# Patient Record
Sex: Female | Born: 1960 | Race: White | Hispanic: No | Marital: Married | Smoking: Never smoker
Health system: Southern US, Community
[De-identification: ages and names within clinical notes are randomized; demographics above are authoritative.]

## PROBLEM LIST (undated history)

## (undated) DIAGNOSIS — R011 Cardiac murmur, unspecified: Secondary | ICD-10-CM

## (undated) DIAGNOSIS — K563 Gallstone ileus: Secondary | ICD-10-CM

## (undated) DIAGNOSIS — K219 Gastro-esophageal reflux disease without esophagitis: Secondary | ICD-10-CM

## (undated) DIAGNOSIS — E039 Hypothyroidism, unspecified: Secondary | ICD-10-CM

## (undated) DIAGNOSIS — K648 Other hemorrhoids: Secondary | ICD-10-CM

## (undated) DIAGNOSIS — I341 Nonrheumatic mitral (valve) prolapse: Secondary | ICD-10-CM

## (undated) DIAGNOSIS — E785 Hyperlipidemia, unspecified: Secondary | ICD-10-CM

## (undated) DIAGNOSIS — Z87442 Personal history of urinary calculi: Secondary | ICD-10-CM

## (undated) DIAGNOSIS — F329 Major depressive disorder, single episode, unspecified: Secondary | ICD-10-CM

## (undated) DIAGNOSIS — G709 Myoneural disorder, unspecified: Secondary | ICD-10-CM

## (undated) DIAGNOSIS — D126 Benign neoplasm of colon, unspecified: Secondary | ICD-10-CM

## (undated) DIAGNOSIS — E079 Disorder of thyroid, unspecified: Secondary | ICD-10-CM

## (undated) DIAGNOSIS — K5790 Diverticulosis of intestine, part unspecified, without perforation or abscess without bleeding: Secondary | ICD-10-CM

## (undated) DIAGNOSIS — B019 Varicella without complication: Secondary | ICD-10-CM

## (undated) DIAGNOSIS — R112 Nausea with vomiting, unspecified: Secondary | ICD-10-CM

## (undated) DIAGNOSIS — F32A Depression, unspecified: Secondary | ICD-10-CM

## (undated) DIAGNOSIS — D649 Anemia, unspecified: Secondary | ICD-10-CM

## (undated) DIAGNOSIS — J189 Pneumonia, unspecified organism: Secondary | ICD-10-CM

## (undated) DIAGNOSIS — J449 Chronic obstructive pulmonary disease, unspecified: Secondary | ICD-10-CM

## (undated) DIAGNOSIS — E669 Obesity, unspecified: Secondary | ICD-10-CM

## (undated) DIAGNOSIS — Z9889 Other specified postprocedural states: Secondary | ICD-10-CM

## (undated) HISTORY — PX: NASAL SINUS SURGERY: SHX719

## (undated) HISTORY — DX: Cardiac murmur, unspecified: R01.1

## (undated) HISTORY — DX: Myoneural disorder, unspecified: G70.9

## (undated) HISTORY — PX: KIDNEY STONE SURGERY: SHX686

## (undated) HISTORY — DX: Depression, unspecified: F32.A

## (undated) HISTORY — PX: LAPAROSCOPIC SMALL BOWEL RESECTION: SUR793

## (undated) HISTORY — DX: Varicella without complication: B01.9

## (undated) HISTORY — DX: Diverticulosis of intestine, part unspecified, without perforation or abscess without bleeding: K57.90

## (undated) HISTORY — PX: TONSILLECTOMY: SUR1361

## (undated) HISTORY — DX: Hyperlipidemia, unspecified: E78.5

## (undated) HISTORY — DX: Other hemorrhoids: K64.8

## (undated) HISTORY — PX: ABDOMINAL HYSTERECTOMY: SHX81

## (undated) HISTORY — DX: Obesity, unspecified: E66.9

## (undated) HISTORY — DX: Chronic obstructive pulmonary disease, unspecified: J44.9

## (undated) HISTORY — DX: Major depressive disorder, single episode, unspecified: F32.9

## (undated) HISTORY — DX: Benign neoplasm of colon, unspecified: D12.6

## (undated) HISTORY — DX: Gallstone ileus: K56.3

---

## 2011-10-20 ENCOUNTER — Other Ambulatory Visit: Payer: Self-pay | Admitting: Occupational Medicine

## 2011-10-20 ENCOUNTER — Ambulatory Visit: Payer: Self-pay

## 2011-10-20 ENCOUNTER — Other Ambulatory Visit (HOSPITAL_COMMUNITY): Payer: Self-pay | Admitting: Family Medicine

## 2011-10-20 DIAGNOSIS — M25562 Pain in left knee: Secondary | ICD-10-CM

## 2011-10-20 DIAGNOSIS — IMO0001 Reserved for inherently not codable concepts without codable children: Secondary | ICD-10-CM

## 2011-10-23 ENCOUNTER — Other Ambulatory Visit (HOSPITAL_COMMUNITY): Payer: Self-pay

## 2011-10-26 ENCOUNTER — Ambulatory Visit (HOSPITAL_COMMUNITY)
Admission: RE | Admit: 2011-10-26 | Discharge: 2011-10-26 | Disposition: A | Discharge status: Home / Self Care | Payer: PRIVATE HEALTH INSURANCE | Source: Ambulatory Visit | Attending: Family Medicine | Admitting: Family Medicine

## 2011-10-26 DIAGNOSIS — IMO0002 Reserved for concepts with insufficient information to code with codable children: Secondary | ICD-10-CM | POA: Insufficient documentation

## 2011-10-26 DIAGNOSIS — M25569 Pain in unspecified knee: Secondary | ICD-10-CM | POA: Insufficient documentation

## 2011-10-26 DIAGNOSIS — M1710 Unilateral primary osteoarthritis, unspecified knee: Secondary | ICD-10-CM | POA: Insufficient documentation

## 2011-10-26 DIAGNOSIS — X58XXXA Exposure to other specified factors, initial encounter: Secondary | ICD-10-CM | POA: Insufficient documentation

## 2011-10-26 DIAGNOSIS — IMO0001 Reserved for inherently not codable concepts without codable children: Secondary | ICD-10-CM

## 2012-01-09 ENCOUNTER — Emergency Department (INDEPENDENT_AMBULATORY_CARE_PROVIDER_SITE_OTHER): Payer: 59

## 2012-01-09 ENCOUNTER — Emergency Department (HOSPITAL_COMMUNITY)
Admission: EM | Admit: 2012-01-09 | Discharge: 2012-01-09 | Disposition: A | Discharge status: Home / Self Care | Payer: 59 | Source: Home / Self Care | Attending: Emergency Medicine | Admitting: Emergency Medicine

## 2012-01-09 ENCOUNTER — Encounter (HOSPITAL_COMMUNITY): Payer: Self-pay | Admitting: Emergency Medicine

## 2012-01-09 ENCOUNTER — Emergency Department (HOSPITAL_COMMUNITY): Payer: Self-pay

## 2012-01-09 DIAGNOSIS — M84376A Stress fracture, unspecified foot, initial encounter for fracture: Secondary | ICD-10-CM

## 2012-01-09 HISTORY — DX: Disorder of thyroid, unspecified: E07.9

## 2012-01-09 LAB — URIC ACID: Uric Acid, Serum: 3.7 mg/dL (ref 2.4–7.0)

## 2012-01-09 NOTE — ED Provider Notes (Signed)
Chief Complaint  Patient presents with  . Foot Pain    History of Present Illness:   Laurie Krueger is a 51 year old female who has had a one-week history of right foot pain and swelling, localized over the mid shaft of the fifth metatarsal. It is sensitive to touch and burns somewhat. There is no pain with ambulation. She denies any injury. Is worse if she wears tight shoes. No numbness or tingling.  Review of Systems:  Other than noted above, the patient denies any of the following symptoms: Systemic:  No fevers, chills, sweats, or aches.  No fatigue or tiredness. Musculoskeletal:  No joint pain, arthritis, bursitis, swelling, back pain, or neck pain. Neurological:  No muscular weakness, paresthesias, headache, or trouble with speech or coordination.  No dizziness.   PMFSH:  Past medical history, family history, social history, meds, and allergies were reviewed.  Physical Exam:   Vital signs:  BP 136/84  Pulse 72  Temp(Src) 97.1 F (36.2 C) (Oral)  Resp 16  SpO2 98% Gen:  Alert and oriented times 3.  In no distress. Musculoskeletal: There is swelling and slight pain to palpation over the midshaft of the fifth metatarsal of the right foot there is mild erythema, no warmth. All joints have a full range of motion. Otherwise, all joints had a full a ROM with no swelling, bruising or deformity.  No edema, pulses full. Extremities were warm and pink.  Capillary refill was brisk.  Skin:  Clear, warm and dry.  No rash. Neuro:  Alert and oriented times 3.  Muscle strength was normal.  Sensation was intact to light touch.   Radiology:  Dg Foot Complete Right  01/09/2012  *RADIOLOGY REPORT*  Clinical Data: Right foot pain and swelling for 1 week, no injury  RIGHT FOOT COMPLETE - 3+ VIEW  Comparison: None.  Findings: There is a nondisplaced transverse fracture of the mid right fifth metatarsal with adjacent soft tissue swelling.  Minimal thickening of the cortex of the adjacent 4th metatarsal also is noted  without definite fracture.  There is considerable degenerative change in the midfoot with degenerative calcaneal spurs present as well.  Joint spaces appear normal.  IMPRESSION: Nondisplaced transverse fracture of the mid right fifth metatarsal.  Original Report Authenticated By: Juline Patch, M.D.    Assessment:   Diagnoses that have been ruled out:  None  Diagnoses that are still under consideration:  None  Final diagnoses:  Stress fracture of metatarsal bone    Plan:   1.  The following meds were prescribed:   New Prescriptions   No medications on file   2.  The patient was instructed in symptomatic care, including rest and activity, elevation, application of ice and compression.  Appropriate handouts were given. 3.  The patient was told to return if becoming worse in any way, if no better in 3 or 4 days, and given some red flag symptoms that would indicate earlier return.   4.  The patient was told to follow up with her orthopedist Dr. Valentina Gu. The patient actually called the orthopedic office while she was here at the urgent care center and they told her to come right out of work, so she was put in a cast or brace and told to go right over there for further and definitive treatment. They should be able to access results the x-ray from their office.   Reuben Likes, MD 01/09/12 1001

## 2012-01-09 NOTE — ED Notes (Signed)
C/o pain, swelling and redness to rt foot for 1 week.  Denies injury. States it started with a red bump to lateral aspect of rt foot- redness, swelling noted to rt foot, feels warm to touch.

## 2012-01-09 NOTE — Discharge Instructions (Signed)
Stress Fracture       When too much stress is put on the foot, as in running and jumping sports, the center shaft of the bones of the forefoot is very susceptible to stress fractures (break in bones) because of thinness of this bone. This injury is more common if osteoporosis is present or if inadequate running shoes are used. Shoes should be used which adequately cushion the foot to absorb the shocks of the activity participated in. Stress fractures are very common in competitive female runners who develop these small cracks on the surface of the bones in their legs and feet. The women most likely to suffer these injuries are those who restrict food and those who have irregular periods.   Stress fractures usually start out as a minor discomfort in the foot or leg. The fracture often occurs near the end of a long run. Usually the pain goes away with rest. On the next day, the pain returns earlier in the run. If an athlete notices that it hurts to touch just one spot on a bone and then stops running for a week, they can return to running quickly. But usually the pain is ignored and a stress fracture develops. The athlete now has to avoid the hard pounding of running, but can ride a bike or swim for exercise until the fracture heals in 6 to 12 weeks. The most common sites for stress fractures are the bones in the front of the feet and the long bone of the lower leg, but running can cause stress fractures anywhere, even in the pelvic bones.   DIAGNOSIS   Usually the diagnosis is made by history. The bone involved progressively becomes sorer with activities. X-rays may be negative (show no break) within the first two to three weeks of the beginning of pain. A later x-ray may show signs of healing bone (callus formation). A bone scan will usually make the diagnosis earlier.   HOME CARE INSTRUCTIONS   Treatment may or may not include a cast or walking shoe. When casts are needed the use is usually for short periods of  time so as not to slow down healing with muscle wasting (atrophy).   Activities should be stopped until further advised by your caregiver.   Wear shoes with adequate shock absorbing abilities.   Alternative exercise may be undertaken while waiting for healing. These may include bicycling and swimming, or as your caregiver suggests.  If you do not have a cast or splint:   You may walk on your injured foot as tolerated or advised.   Do not put any weight on your injured foot until instructed. Slowly increase the amount of time you walk on the foot as the pain allows or as advised.   Use crutches until you can bear weight without pain. A gradual increase in weight bearing may help.   Apply ice to the injury for 15 to 20 minutes each hour while awake for the first 2 days. Put the ice in a plastic bag and place a towel between the bag of ice and your skin.   Only take over-the-counter or prescription medicines for pain, discomfort, or fever as directed by your caregiver.   If your caregiver has given you a follow-up appointment, it is very important to keep that appointment. Not keeping the appointment could result in a chronic or permanent injury, pain, and disability. If there is any problem keeping the appointment, you must call back to this facility for   assistance.  SEEK IMMEDIATE MEDICAL CARE IF:   Pain is becoming worse rather than better, or if pain is uncontrolled with medicine.   You have increased swelling or redness in the foot.  Document Released: 12/30/2002 Document Revised: 09/28/2011 Document Reviewed: 05/25/2008   [employer]® Patient Information ©2012 [employer].

## 2012-02-21 HISTORY — PX: KNEE ARTHROSCOPY: SHX127

## 2012-06-08 ENCOUNTER — Encounter: Payer: Self-pay | Admitting: *Deleted

## 2012-06-08 ENCOUNTER — Encounter: Payer: Commercial Managed Care - PPO | Attending: *Deleted | Admitting: *Deleted

## 2012-06-08 VITALS — Ht 67.5 in | Wt 263.5 lb

## 2012-06-08 DIAGNOSIS — E119 Type 2 diabetes mellitus without complications: Secondary | ICD-10-CM

## 2012-06-08 NOTE — Progress Notes (Signed)
Patient was seen on 06/08/12 for the complete series of three diabetes self-management courses at the [facility]. Current A1c = 9.4%  The following learning objectives were met by the patient during this course: Core 1:  Gain an understanding of diabetes and what causes it  Learn how diabetes is treated and the goals of treatment  Learn why, how and when to test BG  Learn how carbohydrate affects your glucose  Receive a personal food plan and learn how to count carbohydrates  Discover how physical activity enhances glucose control and overall health  Begin to gain confidence in your ability to manage diabetes  Handouts given during this class include:  Type 2 Diabetes: Basics Book  My Food Plan Book  Food and Activity Log  Core 2:  Describe causes, symptoms and treatment of hypoglycemia and hyperglycemia  Learn how to care for your glucose meter and strips  Explain how to manage diabetes during illness  List strategies to follow meal plan when dining out  Describe the effects of alcohol on glucose and how to use it safely  Describe problem solving skills for day-to-day glucose challenges  Describe ways to remain physically active  Describe the impact of regular activity on insulin resistance  Handouts given in this class:  Refrigerator magnet for Sick Day Guidelines  [facility] Oral Medication and Insulin handout  Core 3  Describe how diabetes changes over time  Understand why glucose may be out of target  Learn how diabetes changes over time  Learn about blood pressure, cholesterol, and heart health  Learn about lowering dietary fat and sodium  Understand the benefits of physical activity for heart health  Develop problem solving skills for times when glucose numbers are puzzling  Develop strategies for creating life balance  Learn how to identify if your treatment plan needs to change  Develop strategies for dealing with  stress, depression and staying motivated  Identify healthy weight-loss plans  Gain confidence that you can succeed in caring for your diabetes  Establish 2-3 goals that they will plan to diligently work on until they return                   for the free 3-month follow-up visit   The following handouts were given in class:  3 Month Follow Up Visit handout  Goal setting handout  Class evaluation form  Your patient has established the following 3 month goals for diabetes self-care:  Count carbohydrates at most meals and snacks  Increase activity by walking, swimming,a dn lifting weights  Test glucose at fasting and 2 hr after meals  Follow-Up Plan: Patient was offered a 3 month follow-up visit for diabetes self-management education.  

## 2012-06-08 NOTE — Progress Notes (Signed)
Patient was seen on 06/08/12 for the complete series of three diabetes self-management courses at the [facility]. Current A1c = 9.4%  The following learning objectives were met by the patient during this course: Core 1:  Gain an understanding of diabetes and what causes it  Learn how diabetes is treated and the goals of treatment  Learn why, how and when to test BG  Learn how carbohydrate affects your glucose  Receive a personal food plan and learn how to count carbohydrates  Discover how physical activity enhances glucose control and overall health  Begin to gain confidence in your ability to manage diabetes  Handouts given during this class include:  Type 2 Diabetes: Basics Book  My Food Plan Book  Food and Activity Log  Core 2:  Describe causes, symptoms and treatment of hypoglycemia and hyperglycemia  Learn how to care for your glucose meter and strips  Explain how to manage diabetes during illness  List strategies to follow meal plan when dining out  Describe the effects of alcohol on glucose and how to use it safely  Describe problem solving skills for day-to-day glucose challenges  Describe ways to remain physically active  Describe the impact of regular activity on insulin resistance  Handouts given in this class:  Refrigerator magnet for Sick Day Guidelines  [facility] Oral Medication and Insulin handout  Core 3  Describe how diabetes changes over time  Understand why glucose may be out of target  Learn how diabetes changes over time  Learn about blood pressure, cholesterol, and heart health  Learn about lowering dietary fat and sodium  Understand the benefits of physical activity for heart health  Develop problem solving skills for times when glucose numbers are puzzling  Develop strategies for creating life balance  Learn how to identify if your treatment plan needs to change  Develop strategies for dealing with  stress, depression and staying motivated  Identify healthy weight-loss plans  Gain confidence that you can succeed in caring for your diabetes  Establish 2-3 goals that they will plan to diligently work on until they return                   for the free 48-month follow-up visit   The following handouts were given in class:  3 Month Follow Up Visit handout  Goal setting handout  Class evaluation form  Your patient has established the following 3 month goals for diabetes self-care:  Count carbohydrates at most meals and snacks  Increase activity by walking, swimming,a dn lifting weights  Test glucose at fasting and 2 hr after meals  Follow-Up Plan: Patient was offered a 3 month follow-up visit for diabetes self-management education.

## 2012-06-08 NOTE — Patient Instructions (Addendum)
Goals:  Follow Diabetes Meal Plan as instructed  Eat 3 meals and 2 snacks, every 3-5 hrs  Limit carbohydrate intake to 30-45 grams carbohydrate/meal  Limit carbohydrate intake to 0-15 grams carbohydrate/snack  Add lean protein foods to meals/snacks  Monitor glucose levels as instructed by your doctor  Aim for 15-30 mins of physical activity daily as tolerated  Bring food record and glucose log to your next nutrition visit 

## 2012-08-09 ENCOUNTER — Ambulatory Visit (INDEPENDENT_AMBULATORY_CARE_PROVIDER_SITE_OTHER): Payer: Commercial Managed Care - PPO | Admitting: General Surgery

## 2012-08-09 ENCOUNTER — Encounter (INDEPENDENT_AMBULATORY_CARE_PROVIDER_SITE_OTHER): Payer: Self-pay | Admitting: General Surgery

## 2012-08-09 VITALS — BP 138/82 | HR 77 | Temp 97.9°F | Resp 18 | Ht 67.0 in | Wt 266.6 lb

## 2012-08-09 DIAGNOSIS — E785 Hyperlipidemia, unspecified: Secondary | ICD-10-CM

## 2012-08-09 DIAGNOSIS — Z6841 Body Mass Index (BMI) 40.0 and over, adult: Secondary | ICD-10-CM

## 2012-08-09 DIAGNOSIS — E039 Hypothyroidism, unspecified: Secondary | ICD-10-CM

## 2012-08-09 DIAGNOSIS — E119 Type 2 diabetes mellitus without complications: Secondary | ICD-10-CM

## 2012-08-09 NOTE — Progress Notes (Signed)
Patient ID: Laurie Krueger, female   DOB: November 01, 1960, 51 y.o.   MRN: [id number]  Chief Complaint  Patient presents with  . New Evaluation    Bariatric    HPI Laurie Krueger is a 51 y.o. female.  this patient presents for her initial weight loss surgery consultation. 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 She has a BMI of 41 with comorbidities of diabetes last 3 years, depression, and hyperlipidemia. She says that her diabetes has been poorly controlled with oral medications and her most recent hemoglobin A1c was 9.3. She says that she is active and does play tennis and swims about 2-3 times per week. She is interested in durable weight loss solution since she has gained weight since the birth of her twins about 18 years ago. She denies any heartburn or reflux.HPI  Past Medical History  Diagnosis Date  . Diabetes mellitus   . Thyroid disease   . Hyperlipidemia   . Obesity     Past Surgical History  Procedure Date  . Abdominal hysterectomy   . Tonsillectomy   . Kidney stone surgery     Family History  Problem Relation Age of Onset  . Cancer Other   . Ulcerative colitis Other     Social History History  Substance Use Topics  . Smoking status: Never Smoker   . Smokeless tobacco: Not on file  . Alcohol Use: No    Allergies  Allergen Reactions  . Codeine     Current Outpatient Prescriptions  Medication Sig Dispense Refill  . citalopram (CELEXA) 20 MG tablet Take 20 mg by mouth daily.      . Exenatide (BYDUREON [place]) Inject into the skin once a week.      . fish oil-omega-3 fatty acids 1000 MG capsule Take 2 g by mouth daily.      Marland Kitchen levothyroxine (SYNTHROID) 150 MCG tablet Take 150 mcg by mouth daily.      . rosuvastatin (CRESTOR) 10 MG tablet Take 10 mg by mouth daily.      . Saxagliptin-Metformin (KOMBIGLYZE XR) 02-999 MG TB24 Take by mouth.        Review of Systems Review of Systems All other  review of systems negative or noncontributory except as stated in the HPI  Blood pressure 138/82, pulse 77, temperature 97.9 F (36.6 C), temperature source Oral, resp. rate 18, height 5\' 7"  (1.702 m), weight 266 lb 9.6 oz (120.929 kg).  Physical Exam Physical Exam Physical Exam  Nursing note and vitals reviewed. Constitutional: She is oriented to person, place, and time. She appears well-developed and well-nourished. No distress.  HENT:  Head: Normocephalic and atraumatic.  Mouth/Throat: No oropharyngeal exudate.  Eyes: Conjunctivae and EOM are normal. Pupils are equal, round, and reactive to light. Right eye exhibits no discharge. Left eye exhibits no discharge. No scleral icterus.  Neck: Normal range of motion. Neck supple. No tracheal deviation present.  Cardiovascular: Normal rate, regular rhythm, normal heart sounds and intact distal pulses.   Pulmonary/Chest: Effort normal and breath sounds normal. No stridor. No respiratory distress. She has no wheezes.  Abdominal: Soft. Bowel sounds are normal. She exhibits no distension and no mass. There is no tenderness. There is no rebound and no guarding.  Musculoskeletal: Normal range of motion. She exhibits no edema and no tenderness.  Neurological: She is alert and oriented to person, place, and time.  Skin: Skin is warm and dry.  No rash noted. She is not diaphoretic. No erythema. No pallor.  Psychiatric: She has a normal mood and affect. Her behavior is normal. Judgment and thought content normal.    Data Reviewed   Assessment    Morbid obesity with a BMI of 41 and comorbidities of diabetes, hyperlipidemia, and depression With a long discussion about 40 minutes regarding the options for weight loss including nonsurgical and surgical methods. We discussed the options for lap band, sleeve gastrectomy, and gastric bypass including the pros and cons of each. She is most interested in the gastric bypass or the sleeve gastrectomy and has not  made up her mind at this point. I recommended that she go home and think about the options and continued to do some research and discuss this with her family and her physicians and we will go ahead and start her on the workup for weight loss surgery. She will go ahead and calls back after she decides on the procedure.  The risks of infection, bleeding, pain, scarring, weight regain, too little or too much weight loss, vitamin deficiencies and need for lifelong vitamin supplementation, hair loss, need for protein supplementation, leaks, stricture, reflux, food intolerance, need for reoperation and conversion to roux Y gastric bypass, need for open surgery, injury to spleen or surrounding structures, DVT's, PE, and death were discussed with the patient's and she is leaning towards a sleeve gastrectomy.    Plan    We will proceed with bariatric workup including laboratory studies, upper GI, psychology and nutrition evaluations. I would recommend that she visited with her primary physician to improve her diabetes control prior to weight loss surgery       Nelma Phagan DAVID 08/09/2012, 11:38 AM

## 2012-08-13 ENCOUNTER — Ambulatory Visit (HOSPITAL_COMMUNITY)
Admission: RE | Admit: 2012-08-13 | Discharge: 2012-08-13 | Disposition: A | Discharge status: Home / Self Care | Payer: 59 | Source: Ambulatory Visit | Attending: General Surgery | Admitting: General Surgery

## 2012-08-13 ENCOUNTER — Other Ambulatory Visit (HOSPITAL_COMMUNITY): Payer: Self-pay | Admitting: *Deleted

## 2012-08-13 DIAGNOSIS — Z1231 Encounter for screening mammogram for malignant neoplasm of breast: Secondary | ICD-10-CM

## 2012-08-13 DIAGNOSIS — E039 Hypothyroidism, unspecified: Secondary | ICD-10-CM

## 2012-08-13 DIAGNOSIS — Z6841 Body Mass Index (BMI) 40.0 and over, adult: Secondary | ICD-10-CM

## 2012-08-13 DIAGNOSIS — E785 Hyperlipidemia, unspecified: Secondary | ICD-10-CM

## 2012-08-13 DIAGNOSIS — Z0000 Encounter for general adult medical examination without abnormal findings: Secondary | ICD-10-CM

## 2012-08-13 DIAGNOSIS — E119 Type 2 diabetes mellitus without complications: Secondary | ICD-10-CM

## 2012-08-16 ENCOUNTER — Ambulatory Visit (HOSPITAL_COMMUNITY)
Admission: RE | Admit: 2012-08-16 | Discharge: 2012-08-16 | Disposition: A | Discharge status: Home / Self Care | Payer: 59 | Source: Ambulatory Visit | Attending: *Deleted | Admitting: *Deleted

## 2012-08-16 DIAGNOSIS — Z1231 Encounter for screening mammogram for malignant neoplasm of breast: Secondary | ICD-10-CM | POA: Insufficient documentation

## 2012-08-20 ENCOUNTER — Encounter: Payer: 59 | Attending: *Deleted | Admitting: *Deleted

## 2012-08-20 ENCOUNTER — Encounter: Payer: Self-pay | Admitting: *Deleted

## 2012-08-20 DIAGNOSIS — Z01818 Encounter for other preprocedural examination: Secondary | ICD-10-CM | POA: Insufficient documentation

## 2012-08-20 DIAGNOSIS — Z713 Dietary counseling and surveillance: Secondary | ICD-10-CM | POA: Insufficient documentation

## 2012-08-20 LAB — CBC WITH DIFFERENTIAL/PLATELET
Basophils Relative: 1 % (ref 0–1)
Eosinophils Absolute: 0.2 10*3/uL (ref 0.0–0.7)
Eosinophils Relative: 5 % (ref 0–5)
MCH: 29.1 pg (ref 26.0–34.0)
MCHC: 33.5 g/dL (ref 30.0–36.0)
MCV: 86.9 fL (ref 78.0–100.0)
Neutrophils Relative %: 53 % (ref 43–77)
Platelets: 239 10*3/uL (ref 150–400)

## 2012-08-20 LAB — LIPID PANEL
Cholesterol: 146 mg/dL (ref 0–200)
Total CHOL/HDL Ratio: 2.4 Ratio

## 2012-08-20 LAB — COMPREHENSIVE METABOLIC PANEL
Alkaline Phosphatase: 70 U/L (ref 39–117)
Creat: 0.69 mg/dL (ref 0.50–1.10)
Glucose, Bld: 386 mg/dL — ABNORMAL HIGH (ref 70–99)
Sodium: 137 mEq/L (ref 135–145)
Total Bilirubin: 0.7 mg/dL (ref 0.3–1.2)
Total Protein: 6.5 g/dL (ref 6.0–8.3)

## 2012-08-20 LAB — IRON AND TIBC
%SAT: 24 % (ref 20–55)
Iron: 82 ug/dL (ref 42–145)
TIBC: 348 ug/dL (ref 250–470)
UIBC: 266 ug/dL (ref 125–400)

## 2012-08-20 LAB — TSH: TSH: 1.545 u[IU]/mL (ref 0.350–4.500)

## 2012-08-20 LAB — HEMOGLOBIN A1C: Mean Plasma Glucose: 223 mg/dL — ABNORMAL HIGH (ref <117)

## 2012-08-20 LAB — T4: T4, Total: 13.9 ug/dL — ABNORMAL HIGH (ref 5.0–12.5)

## 2012-08-20 NOTE — Progress Notes (Signed)
  Pre-Op Assessment Visit:  Pre-Operative Gastric Sleeve Surgery  Medical Nutrition Therapy:  Appt start time: 0900   End time:  1000.  Patient was seen on 08/20/2012 for Pre-Operative Gastric Sleeve Nutrition Assessment. Assessment and letter of approval faxed to [facility] Bariatric Surgery Program coordinator on 08/20/2012.  Approval letter sent to [facility] center and will be available in the chart under the media tab.  TANITA  BODY COMP RESULTS  08/20/12   %Fat 51.9%   Fat Mass (lbs) 139.0   Fat Free Mass (lbs) 129.0   Total Body Water (lbs) 94.5   Handouts given during visit include:  Pre-Op Goals   Bariatric Surgery Protein Shakes handout  Patient to call for Pre-Op and Post-Op Nutrition Education at the [facility] when surgery is scheduled.

## 2012-08-20 NOTE — Patient Instructions (Addendum)
   Follow Pre-Op Nutrition Goals to prepare for Bariatric Surgery.   Call the [facility] at [phone] once you have been given your surgery date to enrolled in the Pre-Op Nutrition Class. You will need to attend this nutrition class 3-4 weeks prior to your surgery.  

## 2012-08-21 LAB — H. PYLORI ANTIBODY, IGG: H Pylori IgG: 0.61 {ISR}

## 2012-08-29 ENCOUNTER — Ambulatory Visit: Payer: 59 | Admitting: *Deleted

## 2012-09-09 ENCOUNTER — Ambulatory Visit: Payer: 59 | Admitting: *Deleted

## 2012-09-18 ENCOUNTER — Other Ambulatory Visit (INDEPENDENT_AMBULATORY_CARE_PROVIDER_SITE_OTHER): Payer: Self-pay | Admitting: General Surgery

## 2012-09-18 DIAGNOSIS — E669 Obesity, unspecified: Secondary | ICD-10-CM

## 2012-09-25 ENCOUNTER — Encounter (INDEPENDENT_AMBULATORY_CARE_PROVIDER_SITE_OTHER): Payer: Self-pay | Admitting: General Surgery

## 2012-09-25 ENCOUNTER — Encounter: Payer: Self-pay | Admitting: *Deleted

## 2012-09-25 ENCOUNTER — Ambulatory Visit (INDEPENDENT_AMBULATORY_CARE_PROVIDER_SITE_OTHER): Payer: Commercial Managed Care - PPO | Admitting: General Surgery

## 2012-09-25 DIAGNOSIS — Z6841 Body Mass Index (BMI) 40.0 and over, adult: Secondary | ICD-10-CM

## 2012-09-25 NOTE — Progress Notes (Signed)
Patient ID: Laurie Krueger, female   DOB: 03/28/1961, 51 y.o.   MRN: [id number]  Chief Complaint  Patient presents with  . Bariatric Pre-op    sleeve    HPI Laurie Krueger is a 51 y.o. female.  This patient presents for her preoperative evaluation for vertical sleeve gastrectomy for obesity. She has a BMI of 41 with comorbidities of poorly controlled diabetes and hyperlipidemia. She struggled to wait for a long time and has failed medical weight loss attempts. She has had little success managing her diabetes and is in need of weight loss solution. She is scheduled for surgery in 2 weeks HPI  Past Medical History  Diagnosis Date  . Diabetes mellitus   . Thyroid disease   . Hyperlipidemia   . Obesity     Past Surgical History  Procedure Date  . Abdominal hysterectomy   . Tonsillectomy   . Kidney stone surgery     Family History  Problem Relation Age of Onset  . Cancer Other   . Ulcerative colitis Other     Social History History  Substance Use Topics  . Smoking status: Never Smoker   . Smokeless tobacco: Not on file  . Alcohol Use: No    Allergies  Allergen Reactions  . Codeine     Current Outpatient Prescriptions  Medication Sig Dispense Refill  . citalopram (CELEXA) 20 MG tablet Take 20 mg by mouth daily.      . Exenatide (BYDUREON [place]) Inject into the skin once a week.      . fish oil-omega-3 fatty acids 1000 MG capsule Take 2 g by mouth daily.      . levothyroxine (SYNTHROID) 150 MCG tablet Take 150 mcg by mouth daily.      . rosuvastatin (CRESTOR) 10 MG tablet Take 10 mg by mouth daily.      . Saxagliptin-Metformin (KOMBIGLYZE XR) 02-999 MG TB24 Take by mouth.        Review of Systems Review of Systems All other review of systems negative or noncontributory except as stated in the HPI  Blood pressure 114/68, pulse 85, temperature 97 F (36.1 C), temperature source Temporal, resp. rate 16, height 5' 7" (1.702 m), weight 261 lb (118.389 kg), SpO2  98.00%.  Physical Exam Physical Exam Physical Exam  Nursing note and vitals reviewed. Constitutional: She is oriented to person, place, and time. She appears well-developed and well-nourished. No distress.  HENT:  Head: Normocephalic and atraumatic.  Mouth/Throat: No oropharyngeal exudate.  Eyes: Conjunctivae and EOM are normal. Pupils are equal, round, and reactive to light. Right eye exhibits no discharge. Left eye exhibits no discharge. No scleral icterus.  Neck: Normal range of motion. Neck supple. No tracheal deviation present.  Cardiovascular: Normal rate, regular rhythm, normal heart sounds and intact distal pulses.   Pulmonary/Chest: Effort normal and breath sounds normal. No stridor. No respiratory distress. She has no wheezes.  Abdominal: Soft. Bowel sounds are normal. She exhibits no distension and no mass. There is no tenderness. There is no rebound and no guarding.  Musculoskeletal: Normal range of motion. She exhibits no edema and no tenderness.  Neurological: She is alert and oriented to person, place, and time.  Skin: Skin is warm and dry. No rash noted. She is not diaphoretic. No erythema. No pallor.  Psychiatric: She has a normal mood and affect. Her behavior is normal. Judgment and thought content normal.    Data Reviewed   Assessment    Morbid obesity with a   BMI of 41 and comorbidities of diabetes mellitus and hyperlipidemia We again discussed the options for surgery and she remains interested in a vertical sleeve gastrectomy. We discussed the perioperative course and postoperative expectations. He discussed the pros and cons and the risks and benefits of the procedure.The risks of infection, bleeding, pain, scarring, weight regain, too little or too much weight loss, vitamin deficiencies and need for lifelong vitamin supplementation, hair loss, need for protein supplementation, leaks, stricture, reflux, food intolerance, need for reoperation and conversion to roux Y  gastric bypass, need for open surgery, injury to spleen or surrounding structures, DVT's, PE, and death again discussed with the patient and the patient expressed understanding and desires to proceed with laparoscopic vertical sleeve gastrectomy, possible open, intraoperative endoscopy.     Plan    We will plan for a vertical sleeve gastrectomy as already scheduled       Daphney Hopke DAVID 09/25/2012, 10:31 AM    

## 2012-09-27 ENCOUNTER — Encounter (INDEPENDENT_AMBULATORY_CARE_PROVIDER_SITE_OTHER): Payer: Self-pay

## 2012-09-27 ENCOUNTER — Telehealth (INDEPENDENT_AMBULATORY_CARE_PROVIDER_SITE_OTHER): Payer: Self-pay

## 2012-09-27 NOTE — Telephone Encounter (Signed)
Return to work note completed and faxed at patients request to [employer] (Supervisor) [phone]

## 2012-09-30 ENCOUNTER — Encounter: Payer: Self-pay | Admitting: *Deleted

## 2012-09-30 NOTE — Progress Notes (Unsigned)
Pt stopped by the [facility] office to pick up the following samples:   1.  UNJURY Protein Powder:  (1 ea/ total 4 pkts)  Lot # [id number]; Exp:  Lot # [id number]; Exp:  Lot # [id number]; Exp: 10/14 Lot # [id number]; Exp: 02/15  2. OPURITY Multivitamin (for RYGB/Sleeve): 1 ea Lot # [id number]; Exp: 06/14  3. Celebrate Vitamins: (1 ea of the following)     MVI-Complete - Lot # [id number]; Exp: 06/15     MVI - Lot # [id number]; Exp: 07/14     Sublingual B12 - Lot # [id number]; Exp: 05/15          Calcium citrate - Lot # [id number]; Exp: 03/15     Iron + C 30 mg - Lot # [id number]; Exp: 09/15  4. Premier Protein Shake: 2 bottles     Lot # [id number]; Exp: 08/10/13

## 2012-10-01 ENCOUNTER — Encounter (HOSPITAL_COMMUNITY): Payer: Self-pay | Admitting: Pharmacy Technician

## 2012-10-03 ENCOUNTER — Encounter (HOSPITAL_COMMUNITY): Payer: Self-pay

## 2012-10-03 ENCOUNTER — Encounter (HOSPITAL_COMMUNITY)
Admission: RE | Admit: 2012-10-03 | Discharge: 2012-10-03 | Disposition: A | Discharge status: Home / Self Care | Payer: 59 | Source: Ambulatory Visit | Attending: General Surgery | Admitting: General Surgery

## 2012-10-03 VITALS — BP 133/81 | HR 74 | Temp 97.7°F | Resp 18 | Ht 67.0 in | Wt 261.0 lb

## 2012-10-03 DIAGNOSIS — E669 Obesity, unspecified: Secondary | ICD-10-CM

## 2012-10-03 HISTORY — DX: Personal history of urinary calculi: Z87.442

## 2012-10-03 HISTORY — DX: Nonrheumatic mitral (valve) prolapse: I34.1

## 2012-10-03 HISTORY — DX: Hypothyroidism, unspecified: E03.9

## 2012-10-03 LAB — CBC WITH DIFFERENTIAL/PLATELET
Basophils Absolute: 0 10*3/uL (ref 0.0–0.1)
Basophils Relative: 1 % (ref 0–1)
Eosinophils Absolute: 0.2 10*3/uL (ref 0.0–0.7)
Hemoglobin: 12.4 g/dL (ref 12.0–15.0)
MCH: 29.5 pg (ref 26.0–34.0)
MCHC: 34.5 g/dL (ref 30.0–36.0)
Monocytes Relative: 6 % (ref 3–12)
Neutro Abs: 2.6 10*3/uL (ref 1.7–7.7)
Neutrophils Relative %: 51 % (ref 43–77)
Platelets: 213 10*3/uL (ref 150–400)
RDW: 12.4 % (ref 11.5–15.5)

## 2012-10-03 LAB — SURGICAL PCR SCREEN
MRSA, PCR: NEGATIVE
Staphylococcus aureus: NEGATIVE

## 2012-10-03 LAB — COMPREHENSIVE METABOLIC PANEL
AST: 19 U/L (ref 0–37)
Albumin: 3.5 g/dL (ref 3.5–5.2)
Alkaline Phosphatase: 56 U/L (ref 39–117)
BUN: 12 mg/dL (ref 6–23)
Chloride: 102 mEq/L (ref 96–112)
Potassium: 3.9 mEq/L (ref 3.5–5.1)
Sodium: 136 mEq/L (ref 135–145)
Total Bilirubin: 0.5 mg/dL (ref 0.3–1.2)
Total Protein: 6.4 g/dL (ref 6.0–8.3)

## 2012-10-03 NOTE — Patient Instructions (Addendum)
20 Laurie Krueger  10/03/2012   Your procedure is scheduled on:  12-17 -2013  Report to [facility] at     0530   AM.  Call this number if you have problems the morning of surgery: [phone]  Or Presurgical Testing [phone](Gio Janoski)   Remember: Follow any bowel prep instructions per MD office.   Do not eat food:After Midnight.  Take these medicines the morning of surgery with A SIP OF WATER: Citalopram. Levothyroxine. Crestor. No Diabetic meds AM of.   Do not wear jewelry, make-up or nail polish.  Do not wear lotions, powders, or perfumes. You may wear deodorant.  Do not shave 48 hours prior to surgery.(face and neck okay, no shaving of legs)  Do not bring valuables to the hospital.  Contacts, dentures or bridgework may not be worn into surgery.  Leave suitcase in the car. After surgery it may be brought to your room.  For patients admitted to the hospital, checkout time is 11:00 AM the day of discharge.   Patients discharged the day of surgery will not be allowed to drive home. Must have responsible person with you x 24 hours once discharged.  Name and phone number of your driver: [name], spouse [phone] cell  Special Instructions: CHG Shower Use Special Wash: see special instructions.(avoid face and genitals)   Please read over the following fact sheets that you were given: MRSA Information,  Incentive Spirometry Instruction.    Failure to follow these instructions may result in Cancellation of your surgery.   Patient signature_______________________________________________________

## 2012-10-04 ENCOUNTER — Ambulatory Visit (INDEPENDENT_AMBULATORY_CARE_PROVIDER_SITE_OTHER): Payer: 59 | Admitting: General Surgery

## 2012-10-07 MED ORDER — SODIUM CHLORIDE 0.9 % IV SOLN
1.0000 g | INTRAVENOUS | Status: AC
  Administered 2012-10-08: 1 g via INTRAVENOUS
  Filled 2012-10-07: qty 1

## 2012-10-08 ENCOUNTER — Encounter (HOSPITAL_COMMUNITY): Payer: Self-pay | Admitting: *Deleted

## 2012-10-08 ENCOUNTER — Ambulatory Visit (HOSPITAL_COMMUNITY): Payer: 59 | Admitting: Anesthesiology

## 2012-10-08 ENCOUNTER — Encounter (HOSPITAL_COMMUNITY): Payer: Self-pay | Admitting: Anesthesiology

## 2012-10-08 ENCOUNTER — Encounter (HOSPITAL_COMMUNITY)
Admission: RE | Disposition: A | Discharge status: Home / Self Care | Payer: Self-pay | Source: Ambulatory Visit | Attending: General Surgery

## 2012-10-08 ENCOUNTER — Inpatient Hospital Stay (HOSPITAL_COMMUNITY)
Admission: RE | Admit: 2012-10-08 | Discharge: 2012-10-11 | DRG: 621 | Disposition: A | Discharge status: Home / Self Care | Payer: 59 | Source: Ambulatory Visit | Attending: General Surgery | Admitting: General Surgery

## 2012-10-08 DIAGNOSIS — E119 Type 2 diabetes mellitus without complications: Secondary | ICD-10-CM

## 2012-10-08 DIAGNOSIS — E785 Hyperlipidemia, unspecified: Secondary | ICD-10-CM | POA: Diagnosis present

## 2012-10-08 DIAGNOSIS — R110 Nausea: Secondary | ICD-10-CM | POA: Diagnosis not present

## 2012-10-08 DIAGNOSIS — Z885 Allergy status to narcotic agent status: Secondary | ICD-10-CM

## 2012-10-08 DIAGNOSIS — E079 Disorder of thyroid, unspecified: Secondary | ICD-10-CM | POA: Diagnosis present

## 2012-10-08 DIAGNOSIS — E669 Obesity, unspecified: Secondary | ICD-10-CM

## 2012-10-08 DIAGNOSIS — Z6841 Body Mass Index (BMI) 40.0 and over, adult: Secondary | ICD-10-CM

## 2012-10-08 DIAGNOSIS — R1310 Dysphagia, unspecified: Secondary | ICD-10-CM | POA: Diagnosis not present

## 2012-10-08 HISTORY — PX: LAPAROSCOPIC GASTRIC SLEEVE RESECTION: SHX5895

## 2012-10-08 LAB — GLUCOSE, CAPILLARY
Glucose-Capillary: 222 mg/dL — ABNORMAL HIGH (ref 70–99)
Glucose-Capillary: 200 mg/dL — ABNORMAL HIGH (ref 70–99)
Glucose-Capillary: 199 mg/dL — ABNORMAL HIGH (ref 70–99)

## 2012-10-08 SURGERY — GASTRECTOMY, SLEEVE, LAPAROSCOPIC
Anesthesia: General | Site: Abdomen | Wound class: Clean

## 2012-10-08 MED ORDER — ONDANSETRON HCL 4 MG/2ML IJ SOLN
4.0000 mg | INTRAMUSCULAR | Status: DC | PRN
  Administered 2012-10-08 – 2012-10-10 (×5): 4 mg via INTRAVENOUS
  Filled 2012-10-08 (×5): qty 2

## 2012-10-08 MED ORDER — FENTANYL CITRATE 0.05 MG/ML IJ SOLN
INTRAMUSCULAR | Status: AC
  Filled 2012-10-08: qty 2

## 2012-10-08 MED ORDER — LIDOCAINE-EPINEPHRINE 1 %-1:100000 IJ SOLN
INTRAMUSCULAR | Status: AC
  Filled 2012-10-08: qty 1

## 2012-10-08 MED ORDER — MEPERIDINE HCL 50 MG/ML IJ SOLN: 6.2500 mg | INTRAMUSCULAR | Status: DC | PRN

## 2012-10-08 MED ORDER — LIDOCAINE-EPINEPHRINE 1 %-1:100000 IJ SOLN
INTRAMUSCULAR | Status: DC | PRN
  Administered 2012-10-08: 22 mL

## 2012-10-08 MED ORDER — UNJURY CHICKEN SOUP POWDER: 2.0000 [oz_av] | Freq: Four times a day (QID) | ORAL | Status: DC

## 2012-10-08 MED ORDER — PROMETHAZINE HCL 25 MG/ML IJ SOLN
INTRAMUSCULAR | Status: AC
  Filled 2012-10-08: qty 1

## 2012-10-08 MED ORDER — INSULIN ASPART 100 UNIT/ML ~~LOC~~ SOLN
3.0000 [IU] | Freq: Once | SUBCUTANEOUS | Status: AC
  Administered 2012-10-08: 3 [IU] via SUBCUTANEOUS

## 2012-10-08 MED ORDER — MIDAZOLAM HCL 5 MG/5ML IJ SOLN
INTRAMUSCULAR | Status: DC | PRN
  Administered 2012-10-08: 2 mg via INTRAVENOUS

## 2012-10-08 MED ORDER — INSULIN ASPART 100 UNIT/ML ~~LOC~~ SOLN
0.0000 [IU] | SUBCUTANEOUS | Status: DC
  Administered 2012-10-08: 5 [IU] via SUBCUTANEOUS
  Administered 2012-10-08 (×2): 3 [IU] via SUBCUTANEOUS
  Administered 2012-10-09 – 2012-10-10 (×8): 2 [IU] via SUBCUTANEOUS
  Administered 2012-10-11: 3 [IU] via SUBCUTANEOUS

## 2012-10-08 MED ORDER — ONDANSETRON HCL 4 MG/2ML IJ SOLN
INTRAMUSCULAR | Status: DC | PRN
  Administered 2012-10-08: 4 mg via INTRAVENOUS

## 2012-10-08 MED ORDER — FENTANYL CITRATE 0.05 MG/ML IJ SOLN
INTRAMUSCULAR | Status: DC | PRN
  Administered 2012-10-08: 100 ug via INTRAVENOUS
  Administered 2012-10-08 (×2): 50 ug via INTRAVENOUS

## 2012-10-08 MED ORDER — BUPIVACAINE-EPINEPHRINE PF 0.25-1:200000 % IJ SOLN
INTRAMUSCULAR | Status: AC
  Filled 2012-10-08: qty 30

## 2012-10-08 MED ORDER — LACTATED RINGERS IV SOLN
INTRAVENOUS | Status: DC | PRN
  Administered 2012-10-08 (×3): via INTRAVENOUS

## 2012-10-08 MED ORDER — HYDROMORPHONE HCL PF 1 MG/ML IJ SOLN
0.2500 mg | INTRAMUSCULAR | Status: DC | PRN
  Administered 2012-10-08 (×2): 0.5 mg via INTRAVENOUS
  Administered 2012-10-08: 0.25 mg via INTRAVENOUS
  Administered 2012-10-08: 0.5 mg via INTRAVENOUS

## 2012-10-08 MED ORDER — CISATRACURIUM BESYLATE (PF) 10 MG/5ML IV SOLN
INTRAVENOUS | Status: DC | PRN
  Administered 2012-10-08: 8 mg via INTRAVENOUS
  Administered 2012-10-08: 4 mg via INTRAVENOUS

## 2012-10-08 MED ORDER — TISSEEL VH 10 ML EX KIT
PACK | CUTANEOUS | Status: DC | PRN
  Administered 2012-10-08: 10 mL

## 2012-10-08 MED ORDER — PROMETHAZINE HCL 25 MG/ML IJ SOLN
6.2500 mg | INTRAMUSCULAR | Status: DC | PRN
Start: 2012-10-08 — End: 2012-10-08

## 2012-10-08 MED ORDER — SCOPOLAMINE 1 MG/3DAYS TD PT72
MEDICATED_PATCH | TRANSDERMAL | Status: DC | PRN
  Administered 2012-10-08: 1 via TRANSDERMAL

## 2012-10-08 MED ORDER — LACTATED RINGERS IV SOLN: INTRAVENOUS | Status: DC

## 2012-10-08 MED ORDER — DEXAMETHASONE SODIUM PHOSPHATE 10 MG/ML IJ SOLN
INTRAMUSCULAR | Status: DC | PRN
  Administered 2012-10-08: 10 mg via INTRAVENOUS

## 2012-10-08 MED ORDER — ACETAMINOPHEN 160 MG/5ML PO SOLN
650.0000 mg | ORAL | Status: DC | PRN
  Filled 2012-10-08: qty 20.3

## 2012-10-08 MED ORDER — POTASSIUM CHLORIDE IN NACL 20-0.9 MEQ/L-% IV SOLN
INTRAVENOUS | Status: DC
  Administered 2012-10-08: 19:00:00 via INTRAVENOUS
  Administered 2012-10-09: 125 mL via INTRAVENOUS
  Administered 2012-10-09 – 2012-10-10 (×2): via INTRAVENOUS
  Administered 2012-10-10: 125 mL via INTRAVENOUS
  Filled 2012-10-08 (×8): qty 1000

## 2012-10-08 MED ORDER — LACTATED RINGERS IR SOLN
Status: DC | PRN
  Administered 2012-10-08: 1000 mL

## 2012-10-08 MED ORDER — OXYCODONE-ACETAMINOPHEN 5-325 MG/5ML PO SOLN: 5.0000 mL | ORAL | Status: DC | PRN

## 2012-10-08 MED ORDER — OXYCODONE HCL 5 MG PO TABS: 5.0000 mg | ORAL_TABLET | Freq: Once | ORAL | Status: DC | PRN

## 2012-10-08 MED ORDER — UNJURY CHOCOLATE CLASSIC POWDER
2.0000 [oz_av] | Freq: Four times a day (QID) | ORAL | Status: DC
  Administered 2012-10-11: 2 [oz_av] via ORAL

## 2012-10-08 MED ORDER — ACETAMINOPHEN 10 MG/ML IV SOLN: 1000.0000 mg | Freq: Once | INTRAVENOUS | Status: DC | PRN

## 2012-10-08 MED ORDER — POTASSIUM CHLORIDE IN NACL 20-0.9 MEQ/L-% IV SOLN
INTRAVENOUS | Status: AC
  Filled 2012-10-08: qty 1000

## 2012-10-08 MED ORDER — HYDROMORPHONE HCL PF 1 MG/ML IJ SOLN
INTRAMUSCULAR | Status: AC
  Filled 2012-10-08: qty 1

## 2012-10-08 MED ORDER — ACETAMINOPHEN 10 MG/ML IV SOLN
INTRAVENOUS | Status: DC | PRN
  Administered 2012-10-08: 1000 mg via INTRAVENOUS

## 2012-10-08 MED ORDER — ACETAMINOPHEN 10 MG/ML IV SOLN
INTRAVENOUS | Status: AC
  Filled 2012-10-08: qty 100

## 2012-10-08 MED ORDER — INSULIN ASPART 100 UNIT/ML ~~LOC~~ SOLN
SUBCUTANEOUS | Status: AC
  Filled 2012-10-08: qty 3

## 2012-10-08 MED ORDER — LEVOTHYROXINE SODIUM 150 MCG PO TABS
150.0000 ug | ORAL_TABLET | Freq: Every day | ORAL | Status: DC
  Administered 2012-10-09 – 2012-10-11 (×3): 150 ug via ORAL
  Filled 2012-10-08 (×5): qty 1

## 2012-10-08 MED ORDER — UNJURY VANILLA POWDER: 2.0000 [oz_av] | Freq: Four times a day (QID) | ORAL | Status: DC

## 2012-10-08 MED ORDER — BUPIVACAINE HCL 0.25 % IJ SOLN
INTRAMUSCULAR | Status: DC | PRN
  Administered 2012-10-08: 22 mL

## 2012-10-08 MED ORDER — BUPIVACAINE HCL (PF) 0.25 % IJ SOLN
INTRAMUSCULAR | Status: AC
  Filled 2012-10-08: qty 30

## 2012-10-08 MED ORDER — LIDOCAINE HCL 1 % IJ SOLN
INTRAMUSCULAR | Status: AC
  Filled 2012-10-08: qty 20

## 2012-10-08 MED ORDER — FENTANYL CITRATE 0.05 MG/ML IJ SOLN
25.0000 ug | INTRAMUSCULAR | Status: DC | PRN
  Administered 2012-10-08 (×2): 50 ug via INTRAVENOUS

## 2012-10-08 MED ORDER — EPHEDRINE SULFATE 50 MG/ML IJ SOLN
INTRAMUSCULAR | Status: DC | PRN
  Administered 2012-10-08: 10 mg via INTRAVENOUS

## 2012-10-08 MED ORDER — TISSEEL VH 10 ML EX KIT
PACK | CUTANEOUS | Status: AC
  Filled 2012-10-08: qty 1

## 2012-10-08 MED ORDER — GLYCOPYRROLATE 0.2 MG/ML IJ SOLN
INTRAMUSCULAR | Status: DC | PRN
  Administered 2012-10-08: 0.4 mg via INTRAVENOUS

## 2012-10-08 MED ORDER — HEPARIN SODIUM (PORCINE) 5000 UNIT/ML IJ SOLN
5000.0000 [IU] | Freq: Once | INTRAMUSCULAR | Status: AC
  Administered 2012-10-08: 5000 [IU] via SUBCUTANEOUS
  Filled 2012-10-08: qty 1

## 2012-10-08 MED ORDER — PROPOFOL 10 MG/ML IV BOLUS
INTRAVENOUS | Status: DC | PRN
  Administered 2012-10-08: 170 mg via INTRAVENOUS

## 2012-10-08 MED ORDER — ENOXAPARIN SODIUM 40 MG/0.4ML ~~LOC~~ SOLN
40.0000 mg | SUBCUTANEOUS | Status: DC
  Administered 2012-10-09 – 2012-10-11 (×3): 40 mg via SUBCUTANEOUS
  Filled 2012-10-08 (×5): qty 0.4

## 2012-10-08 MED ORDER — OXYCODONE HCL 5 MG/5ML PO SOLN: 5.0000 mg | Freq: Once | ORAL | Status: DC | PRN

## 2012-10-08 MED ORDER — MORPHINE SULFATE 10 MG/ML IJ SOLN
2.0000 mg | INTRAMUSCULAR | Status: DC | PRN
  Administered 2012-10-08 – 2012-10-10 (×12): 4 mg via INTRAVENOUS
  Filled 2012-10-08 (×13): qty 1

## 2012-10-08 SURGICAL SUPPLY — 53 items
APPLICATOR COTTON TIP 6IN STRL (MISCELLANEOUS) IMPLANT
APPLIER CLIP ROT 10 11.4 M/L (STAPLE) ×2
CABLE HIGH FREQUENCY MONO STRZ (ELECTRODE) IMPLANT
CANISTER SUCTION 2500CC (MISCELLANEOUS) ×2 IMPLANT
CHLORAPREP W/TINT 26ML (MISCELLANEOUS) ×4 IMPLANT
CLIP APPLIE ROT 10 11.4 M/L (STAPLE) ×1 IMPLANT
CLOTH BEACON ORANGE TIMEOUT ST (SAFETY) ×2 IMPLANT
COVER SURGICAL LIGHT HANDLE (MISCELLANEOUS) ×2 IMPLANT
DERMABOND ADVANCED (GAUZE/BANDAGES/DRESSINGS)
DERMABOND ADVANCED .7 DNX12 (GAUZE/BANDAGES/DRESSINGS) IMPLANT
DEVICE SUTURE ENDOST 10MM (ENDOMECHANICALS) IMPLANT
DRAIN CHANNEL 19F RND (DRAIN) ×2 IMPLANT
DRAPE LAPAROSCOPIC ABDOMINAL (DRAPES) ×2 IMPLANT
ELECT REM PT RETURN 9FT ADLT (ELECTROSURGICAL) ×2
ELECTRODE REM PT RTRN 9FT ADLT (ELECTROSURGICAL) ×1 IMPLANT
EVACUATOR DRAINAGE 10X20 100CC (DRAIN) ×1 IMPLANT
EVACUATOR SILICONE 100CC (DRAIN) ×1
GLOVE SURG SS PI 7.5 STRL IVOR (GLOVE) ×4 IMPLANT
GOWN STRL NON-REIN LRG LVL3 (GOWN DISPOSABLE) ×4 IMPLANT
GOWN STRL REIN XL XLG (GOWN DISPOSABLE) ×4 IMPLANT
HANDLE STAPLE EGIA 4 XL (STAPLE) ×2 IMPLANT
HOVERMATT SINGLE USE (MISCELLANEOUS) ×2 IMPLANT
KIT BASIN OR (CUSTOM PROCEDURE TRAY) ×2 IMPLANT
MARKER SKIN DUAL TIP RULER LAB (MISCELLANEOUS) ×2 IMPLANT
NEEDLE SPNL 22GX3.5 QUINCKE BK (NEEDLE) ×2 IMPLANT
NS IRRIG 1000ML POUR BTL (IV SOLUTION) ×2 IMPLANT
PENCIL BUTTON HOLSTER BLD 10FT (ELECTRODE) ×2 IMPLANT
POUCH SPECIMEN RETRIEVAL 10MM (ENDOMECHANICALS) IMPLANT
RELOAD BLACK 60MM ECHELON (STAPLE) IMPLANT
RELOAD EGIA 60 MED/THCK PURPLE (STAPLE) ×6 IMPLANT
RELOAD GREEN (STAPLE) IMPLANT
RELOAD TRI 2.0 60 XTHK VAS SUL (STAPLE) ×4 IMPLANT
SCISSORS LAP 5X35 DISP (ENDOMECHANICALS) ×2 IMPLANT
SEALANT SURGICAL APPL DUAL CAN (MISCELLANEOUS) ×2 IMPLANT
SET IRRIG TUBING LAPAROSCOPIC (IRRIGATION / IRRIGATOR) ×2 IMPLANT
SHEARS CURVED HARMONIC AC 45CM (MISCELLANEOUS) ×2 IMPLANT
SLEEVE XCEL OPT CAN 5 100 (ENDOMECHANICALS) ×4 IMPLANT
SOLUTION ANTI FOG 6CC (MISCELLANEOUS) ×2 IMPLANT
SPONGE GAUZE 4X4 12PLY (GAUZE/BANDAGES/DRESSINGS) IMPLANT
SPONGE LAP 18X18 X RAY DECT (DISPOSABLE) ×2 IMPLANT
STAPLE ECHEON FLEX 60 POW ENDO (STAPLE) IMPLANT
STRIP PERI DRY VERITAS 60 (STAPLE) IMPLANT
SUT ETHILON 2 0 PS N (SUTURE) ×2 IMPLANT
SUT MNCRL AB 4-0 PS2 18 (SUTURE) ×2 IMPLANT
SUT VICRYL 0 UR6 27IN ABS (SUTURE) ×2 IMPLANT
SYR 50ML LL SCALE MARK (SYRINGE) ×2 IMPLANT
TRAY FOLEY CATH 14FRSI W/METER (CATHETERS) ×2 IMPLANT
TRAY LAP CHOLE (CUSTOM PROCEDURE TRAY) ×2 IMPLANT
TROCAR BLADELESS 15MM (ENDOMECHANICALS) ×2 IMPLANT
TROCAR BLADELESS OPT 5 100 (ENDOMECHANICALS) ×2 IMPLANT
TUBING CONNECTING 10 (TUBING) ×2 IMPLANT
TUBING ENDO SMARTCAP (MISCELLANEOUS) ×2 IMPLANT
TUBING FILTER THERMOFLATOR (ELECTROSURGICAL) ×2 IMPLANT

## 2012-10-08 NOTE — Brief Op Note (Signed)
10/08/2012  9:42 AM  PATIENT:  Laurie Krueger  51 y.o. female  PRE-OPERATIVE DIAGNOSIS:  morbid obesity   POST-OPERATIVE DIAGNOSIS:  MORBID OBESITY   PROCEDURE:  Procedure(s) (LRB) with comments: LAPAROSCOPIC GASTRIC SLEEVE RESECTION (N/A)  SURGEON:  Surgeon(s) and Role:    * Lodema Pilot, DO - Primary  PHYSICIAN ASSISTANT:   ASSISTANTS: Newman   ANESTHESIA:   general  EBL:  Total I/O In: 2100 [I.V.:2100] Out: 175 [Urine:125; Blood:50]  BLOOD ADMINISTERED:none  DRAINS: (7F) Jackson-Pratt drain(s) with closed bulb suction in the sleeve staple line   LOCAL MEDICATIONS USED:  MARCAINE    and LIDOCAINE   SPECIMEN:  Source of Specimen:  sleeve gastrectomy  DISPOSITION OF SPECIMEN:  PATHOLOGY  COUNTS:  YES  TOURNIQUET:  * No tourniquets in log *  DICTATION: .Other Dictation: Dictation Number [id number]  PLAN OF CARE: Admit to inpatient   PATIENT DISPOSITION:  PACU - hemodynamically stable.   Delay start of Pharmacological VTE agent (>24hrs) due to surgical blood loss or risk of bleeding: no

## 2012-10-08 NOTE — Anesthesia Postprocedure Evaluation (Signed)
Anesthesia Post Note  Patient: Laurie Krueger  Procedure(s) Performed: Procedure(s) (LRB): LAPAROSCOPIC GASTRIC SLEEVE RESECTION (N/A)  Anesthesia type: General  Patient location: PACU  Post pain: Pain level controlled  Post assessment: Post-op Vital signs reviewed  Last Vitals: BP 125/77  Pulse 99  Temp 36.6 C (Oral)  Resp 16  Ht 5\' 7"  (1.702 m)  Wt 255 lb (115.667 kg)  BMI 39.94 kg/m2  SpO2 94%  Post vital signs: Reviewed  Level of consciousness: sedated  Complications: No apparent anesthesia complications

## 2012-10-08 NOTE — Anesthesia Preprocedure Evaluation (Addendum)
Anesthesia Evaluation  Patient identified by MRN, date of birth, ID band Patient awake    Reviewed: Allergy & Precautions, H&P , NPO status , Patient's Chart, lab work & pertinent test results  Airway Mallampati: I TM Distance: >3 FB Neck ROM: Full    Dental  (+) Dental Advisory Given, Teeth Intact and Chipped,    Pulmonary neg pulmonary ROS,  breath sounds clear to auscultation  Pulmonary exam normal       Cardiovascular - Past MI and - CHF + Valvular Problems/Murmurs MVP Rhythm:Regular Rate:Normal     Neuro/Psych negative neurological ROS  negative psych ROS   GI/Hepatic negative GI ROS, Neg liver ROS,   Endo/Other  diabetes, Type 2, Oral Hypoglycemic AgentsHypothyroidism Morbid obesity  Renal/GU negative Renal ROS     Musculoskeletal negative musculoskeletal ROS (+)   Abdominal (+) + obese,   Peds  Hematology negative hematology ROS (+)   Anesthesia Other Findings   Reproductive/Obstetrics                         Anesthesia Physical Anesthesia Plan  ASA: III  Anesthesia Plan: General   Post-op Pain Management:    Induction: Intravenous  Airway Management Planned: Oral ETT  Additional Equipment:   Intra-op Plan:   Post-operative Plan: Extubation in OR  Informed Consent: I have reviewed the patients History and Physical, chart, labs and discussed the procedure including the risks, benefits and alternatives for the proposed anesthesia with the patient or authorized representative who has indicated his/her understanding and acceptance.   Dental advisory given  Plan Discussed with: CRNA  Anesthesia Plan Comments:         Anesthesia Quick Evaluation

## 2012-10-08 NOTE — Op Note (Signed)
NAME:  Laurie Krueger, Laurie Krueger NO.:  [id number]  MEDICAL RECORD NO.:  [id number]  LOCATION:  1539                         FACILITY:  [facility]  PHYSICIAN:  Laurie Pilot, MD       DATE OF BIRTH:  [birth year]  DATE OF PROCEDURE:  10/08/2012 DATE OF DISCHARGE:                              OPERATIVE REPORT   PROCEDURE:  Laparoscopic vertical sleeve gastrectomy with intraoperative endoscopy.  PREOPERATIVE DIAGNOSIS:  Morbid obesity.  POSTOPERATIVE DIAGNOSIS:  Morbid obesity.  SURGEON:  Laurie Pilot, MD  ASSISTANT:  Laurie Krueger. Laurie Krueger, M.D.  ANESTHESIA:  General endotracheal tube anesthesia with 45 mL of 1% lidocaine with epinephrine and 0.25% Marcaine in a 50:50 mixture.  FLUIDS:  2 L of crystalloid.  ESTIMATED BLOOD LOSS:  50 mL.  DRAINS:  A 19-French Blake drain placed along the gastric sleeve staple line.  SPECIMEN:  Sleeve gastrectomy sent to Pathology for permanent section.  COMPLICATIONS:  None apparent.  FINDINGS:  Vertical sleeve gastrectomy with 36-French bougie, 19-French Blake drain placement on the sleeve staple line.  Tisseel fibrin glue placement on staple line as well.  She had some pelvic adhesions, but otherwise normal-appearing anatomy.  Intraoperative endoscopy showed patent sleeve without any evidence of air leak or stenosis.  INDICATION FOR PROCEDURE:  Laurie Krueger is a 51 year old female with a BMI of 41 and comorbidities of poorly controlled diabetes and hyperlipidemia.  She has struggled with her weight for many years, especially with her poorly controlled diabetes.  She desires long-term weight loss solution.  OPERATIVE DETAILS:  Laurie Krueger was seen and evaluated in the preoperative area and risks and benefits of procedure were again discussed in lay terms.  Informed consent was obtained.  She was given subcu heparin and prophylactic antibiotics and taken to the operating room, placed on the table in a supine position.  General  endotracheal tube anesthesia was obtained and Foley catheter was placed.  Her abdomen was prepped and draped in standard surgical fashion.  The procedure time- out was performed with all operative team members confirm proper patient, procedure.  A 5-mm Optiview trocar was used to access her abdomen.  In the first attempt, pneumoperitoneum was obtained and laparoscope was introduced.  There was no evidence of bleeding or bowel injury upon entry.  A 5-mm left rectus port was placed.  A 15-mm right rectus port and 5-mm right upper quadrant port was placed under direct visualization.  Laurie Krueger liver retractor was passed through a separate stab incision in the epigastrium.  Left lobe of the liver was retracted. All the tubes were removed from the stomach and measured out 5 cm from the pylorus and began to divide the short gastric vessels using the Harmonic scalpel.  Dissection was carried up around the greater curve of the stomach and around the spleen.  The stomach was closely adherent to the spleen in this area but separated nicely with Harmonic scalpel.  I mobilized the stomach off the diaphragm and the left crus was identified.  All the short gastric vessels were divided.  There was no obvious hiatal hernia.  The posterior gastric attachments were divided with sharp dissection to the lesser curve vessels and  stomach was fully mobilized.  Again of note, her tubes were verified to be out of the bounds and started dividing the stomach, starting 5 cm from the pylorus. The first firing was taken angled up towards the angle of His with a 60 mm black Tri-Staple load.  Care was taken not to narrow the stomach and angularis incisura.  A second black Tri-Staple load was placed in the anticipated angle of firings and a 36-French bougie was passed along the lesser curve and down to the pylorus and without repositioning the stapler, the second staple firing was taken.  I then transitioned to 60- mm  purple Tri-Staple load and carried dissection along the bougie towards the angle of His, taking care to avoid Christmas tree formation of the staple line and spiraling of the staple.  Prior to taking the final firing, I mobilized the gastric fat pad and divided the stomach completely at the angle of His taking care to avoid any esophageal injury.  The stomach was completely transected and the staple line was inspected.  There was one spot on the staple line in which I placed some clips for hemostasis.  There was a small hematoma in the midportion of the sleeve, but this was stable and not expanding.  The remainder of the staple line appeared hemostatic.  The spleen appeared hemostatic, and Dr. Ezzard Krueger then performed an upper endoscopy and well lubricated fiberoptic endoscope was passed without difficulty into the esophagus and into the sleeve.  The sleeve was very smooth without any evidence of stricture or internal bleeding.  The scope passed easily into the entrance and to the pylorus.  Then the air was suctioned and the scope was removed.  There was no evidence of air leakage during the endoscopy that I had to submerge into water.  I suctioned the fluid from the abdomen and the hematoma was stable and was not expanding.  Tisseel fibrin glue was placed along with the staple lines and a 19-French Blake drain was passed into the abdomen and placed just posterior to the sleeve.  This was exited through the left upper quadrant trocar site and sutured in place with a 2-0 nylon drain stitches.  The omentum was replaced back over the sleeve and the liver retractor was removed.  The stomach was extracted through an enlarged 15-mm port site and the fascia was approximated in open fashion using 3-0 Vicryl sutures.  The sutures were secured and the abdomen was re-insufflated and the abdominal wall closure was noted to be adequate.  The abdominal wall was noted be hemostatic.  The sleeve appeared  hemostatic.  The spleen appeared hemostatic.  The final trocar was removed under direct visualization and the extraction site was irrigated with sterile saline solution.  The wounds were injected with total of 45 mL of 1% lidocaine with epinephrine.  Skin edges were approximated with 4-0 Monocryl subcuticular suture.  Skin was washed and dried.  Dermabond was applied. All sponge, needle, and instrument counts were correct at end of the case.  The patient tolerated the procedure well without apparent complication.          ______________________________ Laurie Pilot, MD     BL/MEDQ  D:  10/08/2012  T:  10/08/2012  Job:  [id number]

## 2012-10-08 NOTE — Transfer of Care (Signed)
Immediate Anesthesia Transfer of Care Note  Patient: Laurie Krueger  Procedure(s) Performed: Procedure(s) (LRB) with comments: LAPAROSCOPIC GASTRIC SLEEVE RESECTION (N/A)  Patient Location: PACU  Anesthesia Type:General  Level of Consciousness: awake, sedated and patient cooperative  Airway & Oxygen Therapy: Patient Spontanous Breathing and Patient connected to face mask oxygen  Post-op Assessment: Report given to PACU RN and Post -op Vital signs reviewed and stable  Post vital signs: Reviewed and stable  Complications: No apparent anesthesia complications

## 2012-10-08 NOTE — Interval H&P Note (Signed)
History and Physical Interval Note:  10/08/2012 7:16 AM  Laurie Krueger  has presented today for surgery, with the diagnosis of morbid obesity   The various methods of treatment have been discussed with the patient and family. After consideration of risks, benefits and other options for treatment, the patient has consented to  Procedure(s) (LRB) with comments: LAPAROSCOPIC GASTRIC SLEEVE RESECTION (N/A) as a surgical intervention .  The patient's history has been reviewed, patient examined, no change in status, stable for surgery.  I have reviewed the patient's chart and labs.  Questions were answered to the patient's satisfaction.  Pt seen in preop area. Risks of procedure discussed in lay terms.  The risks of infection, bleeding, pain, scarring, weight regain, too little or too much weight loss, vitamin deficiencies and need for lifelong vitamin supplementation, hair loss, need for protein supplementation, leaks, stricture, reflux, food intolerance, need for reoperation and conversion to roux Y gastric bypass, need for open surgery, injury to spleen or surrounding structures, DVT's, PE, and death again discussed with the patient and the patient expressed understanding and desires to proceed with laparoscopic vertical sleeve gastrectomy, possible open, intraoperative endoscopy. She has been on preop diet and all questions answered.  Will plan for sleeve gastrectomy.    Lodema Pilot DAVID

## 2012-10-08 NOTE — Progress Notes (Signed)
Patient ID: Laurie Krueger, female   DOB: 09/11/1961, 51 y.o.   MRN: [id number] Doing fine.  No apparent issues.

## 2012-10-08 NOTE — H&P (View-Only) (Signed)
Patient ID: Laurie Krueger, female   DOB: 1960-10-28, 51 y.o.   MRN: [id number]  Chief Complaint  Patient presents with  . Bariatric Pre-op    sleeve    HPI Laurie Krueger is a 51 y.o. female.  This patient presents for her preoperative evaluation for vertical sleeve gastrectomy for obesity. She has a BMI of 41 with comorbidities of poorly controlled diabetes and hyperlipidemia. She struggled to wait for a long time and has failed medical weight loss attempts. She has had little success managing her diabetes and is in need of weight loss solution. She is scheduled for surgery in 2 weeks HPI  Past Medical History  Diagnosis Date  . Diabetes mellitus   . Thyroid disease   . Hyperlipidemia   . Obesity     Past Surgical History  Procedure Date  . Abdominal hysterectomy   . Tonsillectomy   . Kidney stone surgery     Family History  Problem Relation Age of Onset  . Cancer Other   . Ulcerative colitis Other     Social History History  Substance Use Topics  . Smoking status: Never Smoker   . Smokeless tobacco: Not on file  . Alcohol Use: No    Allergies  Allergen Reactions  . Codeine     Current Outpatient Prescriptions  Medication Sig Dispense Refill  . citalopram (CELEXA) 20 MG tablet Take 20 mg by mouth daily.      . Exenatide (BYDUREON [place]) Inject into the skin once a week.      . fish oil-omega-3 fatty acids 1000 MG capsule Take 2 g by mouth daily.      Marland Kitchen levothyroxine (SYNTHROID) 150 MCG tablet Take 150 mcg by mouth daily.      . rosuvastatin (CRESTOR) 10 MG tablet Take 10 mg by mouth daily.      . Saxagliptin-Metformin (KOMBIGLYZE XR) 02-999 MG TB24 Take by mouth.        Review of Systems Review of Systems All other review of systems negative or noncontributory except as stated in the HPI  Blood pressure 114/68, pulse 85, temperature 97 F (36.1 C), temperature source Temporal, resp. rate 16, height 5\' 7"  (1.702 m), weight 261 lb (118.389 kg), SpO2  98.00%.  Physical Exam Physical Exam Physical Exam  Nursing note and vitals reviewed. Constitutional: She is oriented to person, place, and time. She appears well-developed and well-nourished. No distress.  HENT:  Head: Normocephalic and atraumatic.  Mouth/Throat: No oropharyngeal exudate.  Eyes: Conjunctivae and EOM are normal. Pupils are equal, round, and reactive to light. Right eye exhibits no discharge. Left eye exhibits no discharge. No scleral icterus.  Neck: Normal range of motion. Neck supple. No tracheal deviation present.  Cardiovascular: Normal rate, regular rhythm, normal heart sounds and intact distal pulses.   Pulmonary/Chest: Effort normal and breath sounds normal. No stridor. No respiratory distress. She has no wheezes.  Abdominal: Soft. Bowel sounds are normal. She exhibits no distension and no mass. There is no tenderness. There is no rebound and no guarding.  Musculoskeletal: Normal range of motion. She exhibits no edema and no tenderness.  Neurological: She is alert and oriented to person, place, and time.  Skin: Skin is warm and dry. No rash noted. She is not diaphoretic. No erythema. No pallor.  Psychiatric: She has a normal mood and affect. Her behavior is normal. Judgment and thought content normal.    Data Reviewed   Assessment    Morbid obesity with a  BMI of 41 and comorbidities of diabetes mellitus and hyperlipidemia We again discussed the options for surgery and she remains interested in a vertical sleeve gastrectomy. We discussed the perioperative course and postoperative expectations. He discussed the pros and cons and the risks and benefits of the procedure.The risks of infection, bleeding, pain, scarring, weight regain, too little or too much weight loss, vitamin deficiencies and need for lifelong vitamin supplementation, hair loss, need for protein supplementation, leaks, stricture, reflux, food intolerance, need for reoperation and conversion to roux Y  gastric bypass, need for open surgery, injury to spleen or surrounding structures, DVT's, PE, and death again discussed with the patient and the patient expressed understanding and desires to proceed with laparoscopic vertical sleeve gastrectomy, possible open, intraoperative endoscopy.     Plan    We will plan for a vertical sleeve gastrectomy as already scheduled       Daunte Oestreich DAVID 09/25/2012, 10:31 AM

## 2012-10-08 NOTE — Op Note (Signed)
NAMEMarland Kitchen  KAMEKO, HUKILL NO.:  [id number]  MEDICAL RECORD NO.:  [id number]  LOCATION:  1539                         FACILITY:  [facility]  PHYSICIAN:  Sandria Bales. Ezzard Standing, M.D.  DATE OF BIRTH:  1960-12-11  DATE OF PROCEDURE:  10/08/2012                              OPERATIVE REPORT  PREOPERATIVE DIAGNOSIS:  Morbid obesity with BMI of 41, status post laparoscopic sleeve gastrectomy.  POSTOPERATIVE DIAGNOSIS:  Morbid obesity with BMI of 41, status post laparoscopic sleeve gastrectomy.  PROCEDURE:  Esophagogastroscopy.  SURGEON:  Sandria Bales. Ezzard Standing, MD  No first assistant.  ANESTHESIA:  General endotracheal.  INDICATION FOR PROCEDURE:  Ms. Stitzer is a 51 year old female, who has undergone a laparoscopic sleeve gastrectomy by Dr. Lodema Pilot for morbid obesity.  I am doing upper endoscopy to document the sleeve pouch for both mucosal viability and bleeding.  OPERATIVE NOTE:  The patient in a supine position.  Dr. Biagio Quint is manning the laparoscope during my endoscopy.  I passed the Olympus endoscope down the back of her throat without difficulty.  I visualized the esophagogastric junction at 40 cm.  Her staple line which started immediately beyond the esophagogastric junction was intact.  There was no bleeding.  The mucosa looked viable.  There was no narrowing of the sleeve gastrectomy.  I advanced the scope down to the pylorus and visualized the antrum.  I insufflated air into the pouch.  Dr. Biagio Quint flooded the upper abdomen with saline. There was no evidence of any bubbling noted.  I then withdrew the scope and the esophagus was unremarkable.  Dr. Biagio Quint will dictate the sleeve gastrectomy portion of the operation.   Sandria Bales. Ezzard Standing, M.D., FACS   DHN/MEDQ  D:  10/08/2012  T:  10/08/2012  Job:  [id number]

## 2012-10-09 ENCOUNTER — Encounter (HOSPITAL_COMMUNITY): Payer: Self-pay | Admitting: General Surgery

## 2012-10-09 LAB — CBC WITH DIFFERENTIAL/PLATELET
Basophils Absolute: 0 10*3/uL (ref 0.0–0.1)
Basophils Relative: 0 % (ref 0–1)
Eosinophils Absolute: 0 10*3/uL (ref 0.0–0.7)
Lymphs Abs: 1.5 10*3/uL (ref 0.7–4.0)
MCH: 29.4 pg (ref 26.0–34.0)
MCHC: 33.3 g/dL (ref 30.0–36.0)
Neutrophils Relative %: 72 % (ref 43–77)
Platelets: 191 10*3/uL (ref 150–400)
RBC: 3.61 MIL/uL — ABNORMAL LOW (ref 3.87–5.11)
RDW: 12.8 % (ref 11.5–15.5)

## 2012-10-09 LAB — COMPREHENSIVE METABOLIC PANEL
ALT: 30 U/L (ref 0–35)
CO2: 27 mEq/L (ref 19–32)
Calcium: 9 mg/dL (ref 8.4–10.5)
GFR calc Af Amer: >90 mL/min (ref >90)
GFR calc non Af Amer: >90 mL/min (ref >90)
Glucose, Bld: 145 mg/dL — ABNORMAL HIGH (ref 70–99)
Sodium: 136 mEq/L (ref 135–145)

## 2012-10-09 LAB — GLUCOSE, CAPILLARY
Glucose-Capillary: 141 mg/dL — ABNORMAL HIGH (ref 70–99)
Glucose-Capillary: 135 mg/dL — ABNORMAL HIGH (ref 70–99)
Glucose-Capillary: 133 mg/dL — ABNORMAL HIGH (ref 70–99)
Glucose-Capillary: 105 mg/dL — ABNORMAL HIGH (ref 70–99)

## 2012-10-09 MED ORDER — BIOTENE DRY MOUTH MT LIQD: 15.0000 mL | Freq: Two times a day (BID) | OROMUCOSAL | Status: DC

## 2012-10-09 MED ORDER — CHLORHEXIDINE GLUCONATE 0.12 % MT SOLN
15.0000 mL | Freq: Two times a day (BID) | OROMUCOSAL | Status: DC
  Filled 2012-10-09 (×3): qty 15

## 2012-10-09 NOTE — Progress Notes (Signed)
Pt alert and oriented; VSS; CBG's 135-200, will continue to monitor; denies any nausea or vomiting; burping; +flatus; denies BM at this time; voiding without difficulty; ambulating in hallways without difficulty; c/o some abdominal soreness at JP drain site with relief from prn meds; using incentive as directed; plan of care discussed and pt verbalized understanding of; pt already has follow up appts with [facility] and [facility]; aware of support group and BELT program; discharge instructions given for pt to review; questions answered.  GASTRIC BYPASS/SLEEVE DISCHARGE INSTRUCTIONS  Drs. Fredrik Rigger, Hoxworth, Wilson, and [place] Call if you have any problems.   Call [phone] and ask for the surgeon on call.    If you need immediate assistance come to the ER at [facility]. Tell the ER personnel that you are a new post-op gastric bypass patient. Signs and symptoms to report:   Severe vomiting or nausea. If you cannot tolerate clear liquids for longer than 1 day, you need to call your surgeon.    Abdominal pain which does not get better after taking your pain medication   Fever greater than 101 F degree   Difficulty breathing   Chest pain    Redness, swelling, drainage, or foul odor at incision sites    If your incisions open or pull apart   Swelling or pain in calf (lower leg)   Diarrhea, frequent watery, uncontrolled bowel movements.   Constipation, (no bowel movements for 3 days) if this occurs, Take Milk of Magnesia, 2 tablespoons by mouth, 3 times a day for 2 days if needed.  Call your doctor if constipation continues. Stop taking Milk of Magnesia once you have had a bowel movement. You may also use Miralax according to the label instructions.   Anything you consider "abnormal for you".   Normal side effects after Surgery:   Unable to sleep at night or concentrate   Irritability   Being tearful (crying) or depressed   These are common complaints, possibly related to your anesthesia, stress of  surgery and change in lifestyle, that usually go away a few weeks after surgery.  If these feelings continue, call your medical doctor.  Wound Care You may have surgical glue, steri-strips, or staples over your incisions after surgery.  Surgical glue:  Looks like a clear film over your incisions and will wear off gradually. Steri-strips: Strips of tape over your incisions. You may notice a yellowish color on the skin underneath the steri-strips. This is a substance used to make the steri-strips stick better. Do not pull the steri-strips off - let them fall off.  Staples: Cherlynn Polo may be removed before you leave the hospital. If you go home with staples, call [facility] [phone]) for an appointment with your surgeon's nurse to have staples removed in 7 - 10 days. Showering: You may shower two days after your surgery unless otherwise instructed by your surgeon. Wash gently around wounds with warm soapy water, rinse well, and gently pat dry.  If you have a drain, you may need someone to hold this while you shower. Avoid tub baths until staples are removed and incisions are healed.    Medications   Medications should be liquid or crushed if larger than the size of a dime.  Extended release pills should not be crushed.   Depending on the size and number of medications you take, you may need to stagger/change the time you take your medications so that you do not over-fill your pouch.    Make sure  you follow-up with your primary care physician to make medication adjustments needed during rapid weight loss and life-style adjustment.   If you are diabetic, follow up with the doctor that prescribes your diabetes medication(s) within one week after surgery and check your blood sugar regularly.   Do not drive while taking narcotics!   Do not take acetaminophen (Tylenol) and Roxicet or Lortab Elixir at the same time since these pain medications contain acetaminophen.  Diet at home: (First 2  Weeks) You will see the nutritionist two weeks after your surgery. She will advance your diet if you are tolerating liquids well. Once at home, if you have severe vomiting or nausea and cannot tolerate clear liquids lasting longer than 1 day, call your surgeon.  Begin high protein shake 2 ounces every 3 hours, 5 - 6 times per day.  Gradually increase the amount you drink as tolerated.  You may find it easier to slowly sip shakes throughout the day.  It is important to get your proteins in first.   Protein Shake   Drink at least 2 ounces of shake 5-6 times per day   Each serving of protein shakes should have a minimum of 15 grams of protein and no more than 5 grams of carbohydrate    Increase the amount of protein shake you drink as tolerated   Protein powder may be added to fluids such as non-fat milk or Lactaid milk (limit to 20 grams added protein powder per serving   The initial goal is to drink at least 8 ounces of protein shake/drink per day (or as directed by the nutritionist). Some examples of protein shakes are [employer], [employer], EAS Edge HP, and [employer]jury. Hydration   Gradually increase the amount of water and other liquids as tolerated (See Acceptable Fluids)   Gradually increase the amount of protein shake as tolerated     Sip fluids slowly and throughout the day   May use Sugar substitutes, use sparingly (limit to 6 - 8 packets per day). Your fluid goal is 64 ounces of fluid daily. It may take a few weeks to build up to this.         32 oz (or more) should be clear liquids and 32 oz (or more) should be full liquids.         Liquids should not contain sugar, caffeine, or carbonation! Acceptable Fluids Clear Liquids:   Water or Sugar-free flavored water, Fruit H2O   Decaffeinated coffee or tea (sugar-free)   Crystal Lite, Wyler's Lite, Minute Maid Lite   Sugar-free Jell-O   Bouillon or broth   Sugar-free Popsicle:   *Less than 20 calories each; Limit 1 per day   Full  Liquids:              Protein Shakes/Drinks + 2 choices per day of other full liquids shown below.    Other full liquids must be: No more than 12 grams of Carbs per serving,  No more than 3 grams of Fat per serving   Strained low-fat cream soup   Non-Fat milk   Fat-free Lactaid Milk   Sugar-free yogurt (Dannon Lite & Fit) Vitamins and Minerals (Start 1 day after surgery unless otherwise directed)   2 Chewable Multivitamin / Multimineral Supplement (i.e. Centrum for Adults)   Chewable Calcium Citrate with Vitamin D-3. Take 1500 mg each day.           (Example: 3 Chewable Calcium Plus 600 with Vitamin D-3 can be  found at [facility])         Vitamin B-12, 350 - 500 micrograms (oral tablet) each day   Do not mix multivitamins containing iron with calcium supplements; take 2 hours   apart   Do not substitute Tums (calcium carbonate) for your calcium   Menstruating women and those at risk for anemia may need extra iron. Talk with your doctor to see if you need additional iron.    If you need extra iron:  Total daily Iron recommendations (including Vitamins) = 50 - 100 mg Iron/day Do not stop taking or change any vitamins or minerals until you talk to your nutritionist or surgeon. Your nutritionist and / or physician must approve all vitamin and mineral supplements. Exercise For maximum success, begin exercising as soon as your doctor recommends. Make sure your physician approves any physical activity.   Depending on fitness level, begin with a simple walking program   Walk 5-15 minutes each day, 7 days per week.    Slowly increase until you are walking 30-45 minutes per day   Consider joining our BELT program. [phone] or email belt@[employer] .edu Things to remember:    You may have sexual relations when you feel comfortable. It is VERY important for female patients to use a reliable birth control method. Fertility often increases after surgery. Do not get pregnant for at least 18 months.   It is very  important to keep all follow up appointments with your surgeon, nutritionist, primary care physician, and behavioral health practitioner. After the first year, please follow up with your bariatric surgeon at least once a year in order to maintain best weight loss results.  [facility]: [phone] [facility]: [phone]   Free counseling is available for you and your family through collaboration between [facility] and [facility]. Please call [phone] and leave a message.    Consider purchasing a medical alert bracelet that says you had gastric bypass surgery.    [facility] has a free Bariatric Surgery Support Group that meets monthly, the 3rd Thursday, 6 pm, Classroom #1, [place]. You may register online at [email or web address], but registration is not necessary. Select Classes and Support Groups, Bariatric Surgery, or Call [phone]   Do not return to work or drive until cleared by your surgeon   Use your CPAP when sleeping if applicable   Do not lift anything greater than ten pounds for at least two weeks  Joen Laura, RN BAriatric Nurse Coordinator

## 2012-10-09 NOTE — Progress Notes (Signed)
1 Day Post-Op  Subjective: Doing well postop.  No complaints.  Minimal pain. No nausea  Objective: Vital signs in last 24 hours: Temp:  [97.3 F (36.3 C)-98.9 F (37.2 C)] 98.3 F (36.8 C) (12/18 0655) Pulse Rate:  [74-100] 74  (12/18 0655) Resp:  [15-20] 18  (12/18 0655) BP: (107-148)/(62-83) 107/70 mmHg (12/18 0655) SpO2:  [93 %-98 %] 98 % (12/18 0655) Weight:  [255 lb (115.667 kg)] 255 lb (115.667 kg) (12/17 1118) Last BM Date: 10/08/12  Intake/Output from previous day: 12/17 0701 - 12/18 0700 In: 5703.8 [P.O.:360; I.V.:5343.8] Out: 2085 [Urine:1950; Drains:85; Blood:50] Intake/Output this shift:    General appearance: alert, cooperative and no distress Resp: nonlabored Cardio: normal rate, regular GI: soft, minimal incisional tenderness, ND, wounds okay, JP ss Extremities: scd's bilat  Lab Results:   [facility] 10/09/12 0517  WBC 7.2  HGB 10.6*  HCT 31.8*  PLT 191   BMET  Basename 10/09/12 0517  NA 136  K 4.3  CL 102  CO2 27  GLUCOSE 145*  BUN 10  CREATININE 0.69  CALCIUM 9.0   PT/INR No results found for this basename: LABPROT:2,INR:2 in the last 72 hours ABG No results found for this basename: PHART:2,PCO2:2,PO2:2,HCO3:2 in the last 72 hours  Studies/Results: No results found.  Anti-infectives: Anti-infectives     Start     Dose/Rate Route Frequency Ordered Stop   10/08/12 0600   ertapenem (INVANZ) 1 g in sodium chloride 0.9 % 50 mL IVPB        1 g 100 mL/hr over 30 Minutes Intravenous On call to O.R. 10/07/12 1616 10/08/12 0730          Assessment/Plan: s/p Procedure(s) (LRB) with comments: LAPAROSCOPIC GASTRIC SLEEVE RESECTION (N/A) she is looking great.  will try to start clears today.  LOS: 1 day    Laurie Krueger 10/09/2012

## 2012-10-09 NOTE — Care Management Note (Signed)
    Page 1 of 1   10/09/2012     10:24:05 AM   CARE MANAGEMENT NOTE 10/09/2012  Patient:  Laurie Krueger, Laurie Krueger   Account Number:  [id number]  Date Initiated:  10/09/2012  Documentation initiated by:  Lorenda Ishihara  Subjective/Objective Assessment:   51 yo female admitted s/p lap gastric sleeve resection. PTA lived at home with spouse.     Action/Plan:   Home when stable   Anticipated DC Date:  10/10/2012   Anticipated DC Plan:  HOME/SELF CARE      DC Planning Services  CM consult      Choice offered to / List presented to:             Status of service:  Completed, signed off Medicare Important Message given?   (If response is "NO", the following Medicare IM given date fields will be blank) Date Medicare IM given:   Date Additional Medicare IM given:    Discharge Disposition:  HOME/SELF CARE  Per UR Regulation:  Reviewed for med. necessity/level of care/duration of stay  If discussed at Long Length of Stay Meetings, dates discussed:    Comments:

## 2012-10-10 ENCOUNTER — Inpatient Hospital Stay (HOSPITAL_COMMUNITY): Payer: 59

## 2012-10-10 LAB — GLUCOSE, CAPILLARY
Glucose-Capillary: 102 mg/dL — ABNORMAL HIGH (ref 70–99)
Glucose-Capillary: 119 mg/dL — ABNORMAL HIGH (ref 70–99)
Glucose-Capillary: 122 mg/dL — ABNORMAL HIGH (ref 70–99)
Glucose-Capillary: 129 mg/dL — ABNORMAL HIGH (ref 70–99)
Glucose-Capillary: 131 mg/dL — ABNORMAL HIGH (ref 70–99)

## 2012-10-10 MED ORDER — KCL IN DEXTROSE-NACL 20-5-0.45 MEQ/L-%-% IV SOLN
INTRAVENOUS | Status: DC
  Administered 2012-10-10 – 2012-10-11 (×2): via INTRAVENOUS
  Filled 2012-10-10 (×4): qty 1000

## 2012-10-10 MED ORDER — PROMETHAZINE HCL 25 MG/ML IJ SOLN
25.0000 mg | Freq: Four times a day (QID) | INTRAMUSCULAR | Status: DC | PRN
  Administered 2012-10-10 (×2): 25 mg via INTRAMUSCULAR
  Filled 2012-10-10 (×2): qty 1

## 2012-10-10 MED ORDER — KETOROLAC TROMETHAMINE 30 MG/ML IJ SOLN
30.0000 mg | Freq: Four times a day (QID) | INTRAMUSCULAR | Status: DC | PRN
  Administered 2012-10-10 – 2012-10-11 (×3): 30 mg via INTRAVENOUS
  Filled 2012-10-10 (×3): qty 1

## 2012-10-10 MED ORDER — PANTOPRAZOLE SODIUM 40 MG IV SOLR
40.0000 mg | Freq: Two times a day (BID) | INTRAVENOUS | Status: DC
  Administered 2012-10-10 – 2012-10-11 (×2): 40 mg via INTRAVENOUS
  Filled 2012-10-10 (×3): qty 40

## 2012-10-10 MED ORDER — IOHEXOL 300 MG/ML  SOLN
50.0000 mL | Freq: Once | INTRAMUSCULAR | Status: AC | PRN
  Administered 2012-10-10: 50 mL via ORAL

## 2012-10-10 NOTE — Progress Notes (Signed)
Feels better this evening. Taking ice without problem  xrays okay.  Will add protonix for possible reflux and some dextrose to her fluids but otherwise I do not have a good cause for the nausea.  She thinks that it may be the morphine.  Will see how she does

## 2012-10-10 NOTE — Progress Notes (Signed)
At bedside to speak with patient about the Link to Wellness program as a benefit of her [facility] insurance. Patient was sleeping soundly, snoring upon visit. Did not want to disturb. Will come back at later time.  Raiford Noble, MSN-Ed, RN,BSN [facility] Liaison [phone]

## 2012-10-10 NOTE — Progress Notes (Signed)
2 Days Post-Op  Subjective: She has minimal pain but feels like her liquids are getting stuck and she "cant keep anything down" but she is not vomiting.  Objective: Vital signs in last 24 hours: Temp:  [97.8 F (36.6 C)-98.5 F (36.9 C)] 98.5 F (36.9 C) (12/19 0616) Pulse Rate:  [57-97] 71  (12/19 0616) Resp:  [17-18] 18  (12/19 0616) BP: (109-136)/(59-79) 121/75 mmHg (12/19 0616) SpO2:  [85 %-100 %] 90 % (12/19 0616) Last BM Date: 10/08/12  Intake/Output from previous day: 12/18 0701 - 12/19 0700 In: 2376.7 [P.O.:360; I.V.:2016.7] Out: 2855 [Urine:2700; Drains:155] Intake/Output this shift:    General appearance: alert, cooperative and no distress Resp: nonlabored Cardio: normal reate, regular GI: soft, no significant tenderness, ND, wounds without infection, no peritoneal signs, JP thin ss output Extremities: SCD's bilat.  Lab Results:   [facility] 10/09/12 0517  WBC 7.2  HGB 10.6*  HCT 31.8*  PLT 191   BMET  Basename 10/09/12 0517  NA 136  K 4.3  CL 102  CO2 27  GLUCOSE 145*  BUN 10  CREATININE 0.69  CALCIUM 9.0   PT/INR No results found for this basename: LABPROT:2,INR:2 in the last 72 hours ABG No results found for this basename: PHART:2,PCO2:2,PO2:2,HCO3:2 in the last 72 hours  Studies/Results: No results found.  Anti-infectives: Anti-infectives     Start     Dose/Rate Route Frequency Ordered Stop   10/08/12 0600   ertapenem (INVANZ) 1 g in sodium chloride 0.9 % 50 mL IVPB        1 g 100 mL/hr over 30 Minutes Intravenous On call to O.R. 10/07/12 1616 10/08/12 0730          Assessment/Plan: s/p Procedure(s) (LRB) with comments: LAPAROSCOPIC GASTRIC SLEEVE RESECTION (N/A) will check UGI this am to evaluate symptoms of dysphagia but otherwise seems appropriate.  LOS: 2 days    Lodema Pilot DAVID 10/10/2012

## 2012-10-10 NOTE — Progress Notes (Signed)
Pt not feeling well this am; states she is nauseated and vomiting this morning during UGI; Dr. Biagio Quint aware of UGI results-no leaks or obstructions seen; pt states she has a headache as well; burping; +flatus; no BM; voiding without difficulty; c/o mild abdominal pain but mainly headache with relief from prn meds; ambulating in hallways when not nauseated; using incentive spirometer as directed; encouraged slow sips of bariatric clear liquids today and will re-evaluate.   Joen Laura, RN Bariatric Nurse Coordinator

## 2012-10-11 LAB — CBC WITH DIFFERENTIAL/PLATELET
Basophils Absolute: 0 10*3/uL (ref 0.0–0.1)
Basophils Relative: 0 % (ref 0–1)
Hemoglobin: 10.5 g/dL — ABNORMAL LOW (ref 12.0–15.0)
MCHC: 33.8 g/dL (ref 30.0–36.0)
Neutro Abs: 2.2 10*3/uL (ref 1.7–7.7)
Neutrophils Relative %: 47 % (ref 43–77)
Platelets: 162 10*3/uL (ref 150–400)
RDW: 12.5 % (ref 11.5–15.5)

## 2012-10-11 LAB — BASIC METABOLIC PANEL
BUN: 7 mg/dL (ref 6–23)
Calcium: 8.7 mg/dL (ref 8.4–10.5)
Creatinine, Ser: 0.73 mg/dL (ref 0.50–1.10)
GFR calc Af Amer: >90 mL/min (ref >90)
GFR calc non Af Amer: >90 mL/min (ref >90)
Potassium: 4.1 mEq/L (ref 3.5–5.1)

## 2012-10-11 LAB — GLUCOSE, CAPILLARY: Glucose-Capillary: 130 mg/dL — ABNORMAL HIGH (ref 70–99)

## 2012-10-11 MED ORDER — PROMETHAZINE HCL 25 MG RE SUPP: 25.0000 mg | Freq: Four times a day (QID) | RECTAL | Status: DC | PRN

## 2012-10-11 MED ORDER — PANTOPRAZOLE SODIUM 40 MG PO TBEC: 40.0000 mg | DELAYED_RELEASE_TABLET | Freq: Every day | ORAL | Status: DC

## 2012-10-11 MED ORDER — ONDANSETRON 4 MG PO TBDP: 4.0000 mg | ORAL_TABLET | Freq: Three times a day (TID) | ORAL | Status: DC | PRN

## 2012-10-11 MED ORDER — OXYCODONE-ACETAMINOPHEN 5-325 MG/5ML PO SOLN: 5.0000 mL | ORAL | Status: DC | PRN

## 2012-10-11 NOTE — Progress Notes (Signed)
3 Days Post-Op  Subjective: "feels 100% better than last night", no pain, taking ice okay  Objective: Vital signs in last 24 hours: Temp:  [98.1 F (36.7 C)-100 F (37.8 C)] 99.2 F (37.3 C) (12/20 0600) Pulse Rate:  [59-75] 64  (12/20 0600) Resp:  [16-18] 16  (12/20 0600) BP: (124-145)/(69-77) 136/74 mmHg (12/20 0600) SpO2:  [90 %-94 %] 94 % (12/20 0600) Last BM Date: 10/08/12  Intake/Output from previous day: Nov 07, 2023 0701 - 12/20 0700 In: 4002.9 [P.O.:480; I.V.:3022.9] Out: 3090 [Urine:2900; Emesis/NG output:25; Drains:165] Intake/Output this shift:    General appearance: alert, cooperative and no distress Resp: nonlabored Cardio: normal rate, regular GI: soft, NT, ND, wounds without infection, JP thin serous  Lab Results:   Basename 10/11/12 0500 10/09/12 0517  WBC 4.8 7.2  HGB 10.5* 10.6*  HCT 31.1* 31.8*  PLT 162 191   BMET  Basename 10/11/12 0500 10/09/12 0517  NA 136 136  K 4.1 4.3  CL 105 102  CO2 27 27  GLUCOSE 158* 145*  BUN 7 10  CREATININE 0.73 0.69  CALCIUM 8.7 9.0   PT/INR No results found for this basename: LABPROT:2,INR:2 in the last 72 hours ABG No results found for this basename: PHART:2,PCO2:2,PO2:2,HCO3:2 in the last 72 hours  Studies/Results: Dg Abd 2 Views  06-Nov-2012  *RADIOLOGY REPORT*  Clinical Data: Postop nausea and vomiting.  Postop gastrectomy  ABDOMEN - 2 VIEW  Comparison: Upper GI from today  Findings: Surgical drain around the stomach.  Surgical suture line overlying the stomach.  Normal bowel gas pattern.  No bowel obstruction.  Negative for pneumoperitoneum.  Gas and contrast in the colon.  There is gas in the rectum.  No bowel obstruction.  IMPRESSION: No acute abnormality.   Original Report Authenticated By: Janeece Riggers, M.D.    Dg Kayleen Memos W/water Sol Cm  2012/11/06  *RADIOLOGY REPORT*  Clinical Data:  Status post vertical sleeve gastrectomy.  Dysphagia  UPPER GI SERIES WITH KUB  Technique:  Routine upper GI series was  performed with water soluble.  Fluoroscopy Time: 1.3 minutes  Comparison:  Upper GI 08/13/2012  Findings: Initial KUB demonstrates a surgical drain along the greater curvature of the stomach.  Oral contrast passed the GE junction easily and collects in the stomach.  No evidence of leak along the resection margin.  Contrast initially collects in the gastric antrum.  Contrast eventually into the duodenum  after approximately 3 to 5 minutes.  The patient experienced emesis towards the study.  IMPRESSION:  1.  No evidence of leak along the surgical resection margin of the stomach. 2.  No evidence of obstruction.  Contrast flows into the duodenum after some delay.  Findings conveyed to Dr. Biagio Quint on 11/06/12.   Original Report Authenticated By: Genevive Bi, M.D.     Anti-infectives: Anti-infectives     Start     Dose/Rate Route Frequency Ordered Stop   10/08/12 0600   ertapenem (INVANZ) 1 g in sodium chloride 0.9 % 50 mL IVPB        1 g 100 mL/hr over 30 Minutes Intravenous On call to O.R. 10/07/12 1616 10/08/12 0730          Assessment/Plan: s/p Procedure(s) (LRB) with comments: LAPAROSCOPIC GASTRIC SLEEVE RESECTION (N/A) trial diet again to see if tolerated.  If taking PO then may be ready for discharge later today or tomorrow.  LOS: 3 days    Laurie Krueger 10/11/2012

## 2012-10-11 NOTE — Discharge Summary (Signed)
  Physician Discharge Summary  Patient ID: Laurie Krueger MRN: [id number] DOB/AGE: 1961-04-22 51 y.o.  Admit date: 10/08/2012 Discharge date: 10/11/2012  Admission Diagnoses:obesity  Discharge Diagnoses: same Active Problems:  * No active hospital problems. *    Discharged Condition: stable  Hospital Course: to OR 10/08/12 for lap sleeve gastrectomy.  Diet advanced on POD 1 and on POD 2 had continued nausea and some vomiting.  UGI and xrays negative.  She stopped her pain meds and nausea resolved.  She was tolerating liquid diet and stable for discharge on POD 3.  Consults: None  Significant Diagnostic Studies: UGI  Treatments: IV hydration and surgery: 10/08/12 lap sleeve gastrectomy  Disposition: 01-Home or Self Care  Discharge Orders    Future Orders Please Complete By Expires   Increase activity slowly      Discharge instructions      Comments:   Call [phone] for follow up appointment with Biagio Quint in 3 weeks May shower tomorrow. Start taking vitamins and protein shakes. Crush all medications or liquid meds for 4 weeks. Monitor blood sugars closely. Bariatric diet as discussed.  Full liquids x1 week, then pureed diet x1 week, then soft diet x1 week, then slowly advance to low carb, low fat, high protein diet.   Call MD for:  temperature >100.4      Call MD for:  persistant nausea and vomiting      Call MD for:  severe uncontrolled pain      Call MD for:  redness, tenderness, or signs of infection (pain, swelling, redness, odor or green/yellow discharge around incision site)      Call MD for:  difficulty breathing, headache or visual disturbances      Call MD for:  hives      Call MD for:  persistant dizziness or light-headedness          Medication List     As of 10/11/2012  1:29 PM    TAKE these medications         citalopram 20 MG tablet   Commonly known as: CELEXA   Take 20 mg by mouth every morning.      fish oil-omega-3 fatty acids 1000 MG capsule   Take 2 g by mouth daily.      KOMBIGLYZE XR 02-999 MG Tb24   Generic drug: Saxagliptin-Metformin   Take 1 tablet by mouth daily.      ondansetron 4 MG disintegrating tablet   Commonly known as: ZOFRAN-ODT   Take 1 tablet (4 mg total) by mouth every 8 (eight) hours as needed for nausea.      oxyCODONE-acetaminophen 5-325 MG/5ML solution   Commonly known as: ROXICET   Take 5-10 mLs by mouth every 4 (four) hours as needed.      pantoprazole 40 MG tablet   Commonly known as: PROTONIX   Take 1 tablet (40 mg total) by mouth daily.      promethazine 25 MG suppository   Commonly known as: PHENERGAN   Place 1 suppository (25 mg total) rectally every 6 (six) hours as needed for nausea.      rosuvastatin 10 MG tablet   Commonly known as: CRESTOR   Take 10 mg by mouth every morning.      SYNTHROID 150 MCG tablet   Generic drug: levothyroxine   Take 150 mcg by mouth every morning.         SignedLodema Pilot DAVID 10/11/2012, 1:29 PM

## 2012-10-11 NOTE — Progress Notes (Signed)
She is feeling better. Tolerating protein shakes and jello.  No pain.  HD stable and abdomen is nontender.  I offered to keep her throughout the day to ensure that the nausea does not return but she says that she feels fine and wants discharge.  We will go ahead and try to discharge her and she will return this weekend if any nausea returns.

## 2012-10-11 NOTE — Progress Notes (Signed)
Temp 100.00 encouraged IS. Katalyna Socarras RN

## 2012-10-11 NOTE — Progress Notes (Signed)
Pt alert and oriented; VSS; states she feels much better today; denies any nausea or vomiting since last night; will trial bariatric clear liquids and protein shakes today; burping; +flatus; denies BM; voiding without difficulty; ambulating in hallways without difficulty; using incentive as directed; verbalizes minimal pain; headache improved; pt already has follow up appts with [facility] and [facility]; aware of support group and BELT program; discharge instructions reviewed and pt verbalized understanding of; questions answered.  GASTRIC BYPASS/SLEEVE DISCHARGE INSTRUCTIONS  Drs. Fredrik Rigger, Hoxworth, Wilson, and [place] Call if you have any problems.   Call [phone] and ask for the surgeon on call.    If you need immediate assistance come to the ER at [facility]. Tell the ER personnel that you are a new post-op gastric bypass patient. Signs and symptoms to report:   Severe vomiting or nausea. If you cannot tolerate clear liquids for longer than 1 day, you need to call your surgeon.    Abdominal pain which does not get better after taking your pain medication   Fever greater than 101 F degree   Difficulty breathing   Chest pain    Redness, swelling, drainage, or foul odor at incision sites    If your incisions open or pull apart   Swelling or pain in calf (lower leg)   Diarrhea, frequent watery, uncontrolled bowel movements.   Constipation, (no bowel movements for 3 days) if this occurs, Take Milk of Magnesia, 2 tablespoons by mouth, 3 times a day for 2 days if needed.  Call your doctor if constipation continues. Stop taking Milk of Magnesia once you have had a bowel movement. You may also use Miralax according to the label instructions.   Anything you consider "abnormal for you".   Normal side effects after Surgery:   Unable to sleep at night or concentrate   Irritability   Being tearful (crying) or depressed   These are common complaints, possibly related to your anesthesia, stress of  surgery and change in lifestyle, that usually go away a few weeks after surgery.  If these feelings continue, call your medical doctor.  Wound Care You may have surgical glue, steri-strips, or staples over your incisions after surgery.  Surgical glue:  Looks like a clear film over your incisions and will wear off gradually. Steri-strips: Strips of tape over your incisions. You may notice a yellowish color on the skin underneath the steri-strips. This is a substance used to make the steri-strips stick better. Do not pull the steri-strips off - let them fall off.  Staples: Cherlynn Polo may be removed before you leave the hospital. If you go home with staples, call [facility] [phone]) for an appointment with your surgeon's nurse to have staples removed in 7 - 10 days. Showering: You may shower two days after your surgery unless otherwise instructed by your surgeon. Wash gently around wounds with warm soapy water, rinse well, and gently pat dry.  If you have a drain, you may need someone to hold this while you shower. Avoid tub baths until staples are removed and incisions are healed.    Medications   Medications should be liquid or crushed if larger than the size of a dime.  Extended release pills should not be crushed.   Depending on the size and number of medications you take, you may need to stagger/change the time you take your medications so that you do not over-fill your pouch.    Make sure you follow-up with your primary care physician  to make medication adjustments needed during rapid weight loss and life-style adjustment.   If you are diabetic, follow up with the doctor that prescribes your diabetes medication(s) within one week after surgery and check your blood sugar regularly.   Do not drive while taking narcotics!   Do not take acetaminophen (Tylenol) and Roxicet or Lortab Elixir at the same time since these pain medications contain acetaminophen.  Diet at home: (First 2  Weeks) You will see the nutritionist two weeks after your surgery. She will advance your diet if you are tolerating liquids well. Once at home, if you have severe vomiting or nausea and cannot tolerate clear liquids lasting longer than 1 day, call your surgeon.  Begin high protein shake 2 ounces every 3 hours, 5 - 6 times per day.  Gradually increase the amount you drink as tolerated.  You may find it easier to slowly sip shakes throughout the day.  It is important to get your proteins in first.   Protein Shake   Drink at least 2 ounces of shake 5-6 times per day   Each serving of protein shakes should have a minimum of 15 grams of protein and no more than 5 grams of carbohydrate    Increase the amount of protein shake you drink as tolerated   Protein powder may be added to fluids such as non-fat milk or Lactaid milk (limit to 20 grams added protein powder per serving   The initial goal is to drink at least 8 ounces of protein shake/drink per day (or as directed by the nutritionist). Some examples of protein shakes are [employer], Dillard's, EAS Edge HP, and Unjury. Hydration   Gradually increase the amount of water and other liquids as tolerated (See Acceptable Fluids)   Gradually increase the amount of protein shake as tolerated     Sip fluids slowly and throughout the day   May use Sugar substitutes, use sparingly (limit to 6 - 8 packets per day). Your fluid goal is 64 ounces of fluid daily. It may take a few weeks to build up to this.         32 oz (or more) should be clear liquids and 32 oz (or more) should be full liquids.         Liquids should not contain sugar, caffeine, or carbonation! Acceptable Fluids Clear Liquids:   Water or Sugar-free flavored water, Fruit H2O   Decaffeinated coffee or tea (sugar-free)   Crystal Lite, Wyler's Lite, Minute Maid Lite   Sugar-free Jell-O   Bouillon or broth   Sugar-free Popsicle:   *Less than 20 calories each; Limit 1 per day   Full  Liquids:              Protein Shakes/Drinks + 2 choices per day of other full liquids shown below.    Other full liquids must be: No more than 12 grams of Carbs per serving,  No more than 3 grams of Fat per serving   Strained low-fat cream soup   Non-Fat milk   Fat-free Lactaid Milk   Sugar-free yogurt (Dannon Lite & Fit) Vitamins and Minerals (Start 1 day after surgery unless otherwise directed)   2 Chewable Multivitamin / Multimineral Supplement (i.e. Centrum for Adults)   Chewable Calcium Citrate with Vitamin D-3. Take 1500 mg each day.           (Example: 3 Chewable Calcium Plus 600 with Vitamin D-3 can be found at [facility])  Vitamin B-12, 350 - 500 micrograms (oral tablet) each day   Do not mix multivitamins containing iron with calcium supplements; take 2 hours   apart   Do not substitute Tums (calcium carbonate) for your calcium   Menstruating women and those at risk for anemia may need extra iron. Talk with your doctor to see if you need additional iron.    If you need extra iron:  Total daily Iron recommendations (including Vitamins) = 50 - 100 mg Iron/day Do not stop taking or change any vitamins or minerals until you talk to your nutritionist or surgeon. Your nutritionist and / or physician must approve all vitamin and mineral supplements. Exercise For maximum success, begin exercising as soon as your doctor recommends. Make sure your physician approves any physical activity.   Depending on fitness level, begin with a simple walking program   Walk 5-15 minutes each day, 7 days per week.    Slowly increase until you are walking 30-45 minutes per day   Consider joining our BELT program. [phone] or email [email or web address] Things to remember:    You may have sexual relations when you feel comfortable. It is VERY important for female patients to use a reliable birth control method. Fertility often increases after surgery. Do not get pregnant for at least 18 months.   It is very  important to keep all follow up appointments with your surgeon, nutritionist, primary care physician, and behavioral health practitioner. After the first year, please follow up with your bariatric surgeon at least once a year in order to maintain best weight loss results.  [facility]: [phone] [facility]: [phone]   Free counseling is available for you and your family through collaboration between [facility] and [place]. Please call [phone] and leave a message.    Consider purchasing a medical alert bracelet that says you had gastric bypass surgery.    The [facility] has a free Bariatric Surgery Support Group that meets monthly, the 3rd Thursday, 6 pm, Classroom #1, [place]. You may register online at [email or web address], but registration is not necessary. Select Classes and Support Groups, Bariatric Surgery, or Call [phone]   Do not return to work or drive until cleared by your surgeon   Use your CPAP when sleeping if applicable   Do not lift anything greater than ten pounds for at least two weeks  Joen Laura, RN Bariatric Nurse Coordinator

## 2012-10-14 ENCOUNTER — Telehealth (INDEPENDENT_AMBULATORY_CARE_PROVIDER_SITE_OTHER): Payer: Self-pay | Admitting: General Surgery

## 2012-10-14 NOTE — Telephone Encounter (Signed)
I called to see how she was doing and she says that she is doing well. Nausea resolved.  No complaints.

## 2012-10-21 ENCOUNTER — Encounter: Payer: 59 | Attending: *Deleted | Admitting: *Deleted

## 2012-10-21 DIAGNOSIS — Z713 Dietary counseling and surveillance: Secondary | ICD-10-CM | POA: Insufficient documentation

## 2012-10-21 DIAGNOSIS — Z01818 Encounter for other preprocedural examination: Secondary | ICD-10-CM | POA: Insufficient documentation

## 2012-10-22 ENCOUNTER — Encounter: Payer: Self-pay | Admitting: *Deleted

## 2012-10-23 NOTE — Patient Instructions (Signed)
Patient to follow Phase 3A-Soft, High Protein Diet and follow-up at [facility] in 6 weeks for 2 months post-op nutrition visit for diet advancement. 

## 2012-10-23 NOTE — Progress Notes (Addendum)
Bariatric Class:  Appt start time: 1600 end time:  1700.  2 Week Post-Operative Nutrition Class  Patient was seen on 10/21/12 for Post-Operative Nutrition [facility].   Surgery date: 10/08/12 Surgery type: Gastric Sleeve Start weight at [facility]: 268.0 lbs (08/20/12)  Weight today: 248.6 lbs Weight change: 19.4 lbs Total weight lost: 19.4 lbs  TANITA BODY COMP RESULTS   08/20/12  10/21/12   BMI (kg/m^2) 42.0 38.9   Fat Mass (lbs)  139.0  N/A   Fat Free Mass (lbs)  129.0  N/A   Total Body Water (lbs)  94.5  N/A   The following the learning objectives were met by the patient during this course:  Identifies Phase 3A (Soft, High Proteins) Dietary Goals and will begin from 2 weeks post-operatively to 2 months post-operatively  Identifies appropriate sources of fluids and proteins   States protein recommendations and appropriate sources post-operatively  Identifies the need for appropriate texture modifications, mastication, and bite sizes when consuming solids  Identifies appropriate multivitamin and calcium sources post-operatively  Describes the need for physical activity post-operatively and will follow MD recommendations  States when to call healthcare provider regarding medication questions or post-operative complications  Handouts given during class include:  Phase 3A: Soft, High Protein Diet Handout  Samples given during class include:   Freedavite MVI: 1 bottle/5 tabs Lot # [id number]; Exp: 01/17  Follow-Up Plan: Patient will follow-up at [facility] in 6 weeks for 2 months post-op nutrition visit for diet advancement per MD.

## 2012-11-07 ENCOUNTER — Ambulatory Visit (INDEPENDENT_AMBULATORY_CARE_PROVIDER_SITE_OTHER): Payer: Commercial Managed Care - PPO | Admitting: General Surgery

## 2012-11-07 ENCOUNTER — Encounter (INDEPENDENT_AMBULATORY_CARE_PROVIDER_SITE_OTHER): Payer: Self-pay | Admitting: General Surgery

## 2012-11-07 VITALS — BP 134/82 | HR 76 | Temp 98.0°F | Resp 16 | Ht 67.0 in | Wt 248.0 lb

## 2012-11-07 DIAGNOSIS — Z4889 Encounter for other specified surgical aftercare: Secondary | ICD-10-CM

## 2012-11-07 DIAGNOSIS — Z5189 Encounter for other specified aftercare: Secondary | ICD-10-CM

## 2012-11-07 MED ORDER — ONDANSETRON 4 MG PO TBDP: 4.0000 mg | ORAL_TABLET | Freq: Three times a day (TID) | ORAL | Status: DC | PRN

## 2012-11-07 MED ORDER — PANTOPRAZOLE SODIUM 40 MG PO TBEC: 40.0000 mg | DELAYED_RELEASE_TABLET | Freq: Every day | ORAL | Status: DC

## 2012-11-07 NOTE — Progress Notes (Signed)
Subjective:     Patient ID: Laurie Krueger, female   DOB: [birth year], 52 y.o.   MRN: [id number]  HPI This patient follows up in one month status post arthroscopic vertical sleeve gastrectomy. She has lost about 13 pounds since her last visit and about 20 pounds since the hospital stay. She has no abdominal pain and says that she is doing well however she does still have reflux at night. She is taking her Protonix. She says it she is also nauseated at night not throwing up she is taking her Zofran. She is off all her diabetes medications and her blood sugars are running in the 1 teens. She says that she is taking her protein and staying hydrated but she is not taking her multivitamins.  Review of Systems     Objective:   Physical Exam No acute distress and nontoxic-appearing Her abdomen is soft and nontender on exam her incisions are healing well without sign of infection    Assessment:     Status post endoscopic vertical sleeve gastrectomy She's doing well and has no abdominal pain or fevers and however she is having some nausea and some reflux. She is taking Protonix and she thinks that the reflux is likely due to her food choices. I recommended that she continue with Protonix and off for 2 give this to her twice daily but she declined., I am a little concerned about her nausea as well especially since she had so much nausea in the hospital as well. This is only at night and so this may be due to eating large meals for dinner. I recommend that she smaller meals and not lay down immediately after eating. She's going to pay more attention to her food choices and the quantity of her meals and I will see her back in one month and we will reevaluate. I recommended that she take her vitamins and increase her cardio.    Plan:     She will follow up in one month and it is okay that she returns to work. I refilled her Protonix and Zofran

## 2012-11-19 ENCOUNTER — Ambulatory Visit (INDEPENDENT_AMBULATORY_CARE_PROVIDER_SITE_OTHER): Payer: Commercial Managed Care - PPO | Admitting: General Surgery

## 2012-12-04 ENCOUNTER — Encounter: Payer: Self-pay | Admitting: *Deleted

## 2012-12-04 ENCOUNTER — Encounter: Payer: 59 | Attending: *Deleted | Admitting: *Deleted

## 2012-12-04 DIAGNOSIS — Z01818 Encounter for other preprocedural examination: Secondary | ICD-10-CM | POA: Insufficient documentation

## 2012-12-04 DIAGNOSIS — Z713 Dietary counseling and surveillance: Secondary | ICD-10-CM | POA: Insufficient documentation

## 2012-12-04 NOTE — Patient Instructions (Addendum)
Goals:  Follow Phase 3B: High Protein + Non-Starchy Vegetables  Watch the carbs  Aim for lean protein foods to meet 60-80g goal  Continue fluid intake of 64oz +  Avoid drinking 15 minutes before, during and 30 minutes after eating  Aim for >30 min of physical activity daily  TAKE YOUR VITAMINS!! :)  TANITA BODY COMP RESULTS   08/20/12  10/21/12 12/04/12 TOTAL LOST   BMI (kg/m^2) 42.0 38.9 37.5 - 4.5   Fat Mass (lbs)  139.0  -- 117.5 - 21.5   Fat Free Mass (lbs)  129.0  -- 122.0 - 7.0   Total Body Water (lbs)  94.5  -- 89.5 - 5.0

## 2012-12-04 NOTE — Progress Notes (Addendum)
  Follow-up visit:  8 Weeks Post-Operative Gastric Sleeve Surgery  Medical Nutrition Therapy:  Appt start time: 0800   End time: 0830.  Primary concerns today: Post-operative Bariatric Surgery Nutrition Management.  Surgery date: 10/08/12 Surgery type: Gastric Sleeve Start weight at [facility]: 268.0 lbs (08/20/12)  Weight today: 239.5 lbs Weight change: 7.5 lbs Total weight lost: 28.5 lbs  Goal weight: 150-160 lbs % goal met: 23-25%  TANITA BODY COMP RESULTS   08/20/12  10/21/12 12/04/12   BMI (kg/m^2) 42.0 38.9 37.5   Fat Mass (lbs)  139.0  -- 117.5   Fat Free Mass (lbs)  129.0  -- 122.0   Total Body Water (lbs)  94.5  -- 89.5   24-hr recall: B (AM): Unjury Choc w/ water - 21g  Snk (AM): None  L (PM): Chick-fil-a sandwich (no bun) - 25g Snk (PM): None  D (PM):  Winterberry salad w/ chicken, cranberries, and brie - 15-20g  Snk (PM): None  Fluid intake: 20 oz pro shake, 32 oz water, 20 oz diet lemonade Estimated total protein intake: 60-65 g  Medications: See medication list Supplementation: Only taking 1 MVI, calcium "when I remember", and B-12 (daily)  CBG monitoring: Weekly Average CBG per patient: 115-120 mg/dl (pre-op = 161-096 mg)  Lab Results  Component Value Date   HGBA1C 9.4* 08/20/2012  *Reports new A1c scheduled for next week  Using straws: Yes - no problems reported Drinking while eating: No Hair loss: No Carbonated beverages: No N/V/D/C: Regurgitation with lasagna, chicken w/ ginger sauce, regular bacon - 1 x/each Dumping syndrome: None  Recent physical activity:  Swimming @ [employer] 2-3 days/week; Joined BELT program  Progress Towards Goal(s):  In progress.  Handouts given during visit include:  Phase 3B: High Protein + Non-Starchy Vegetables  Bariatric Surgery Fast Food Guide   Nutritional Diagnosis:  [place]-3.3 Overweight/obesity related to past poor dietary habits and physical inactivity as evidenced by patient w/ recent Gastric Sleeve surgery  following dietary guidelines for continued weight loss.    Intervention:  Nutrition education.  Monitoring/Evaluation:  Dietary intake, exercise, and body weight. Follow up in 1 months for 3 month post-op visit.

## 2012-12-06 ENCOUNTER — Ambulatory Visit (INDEPENDENT_AMBULATORY_CARE_PROVIDER_SITE_OTHER): Payer: Commercial Managed Care - PPO | Admitting: General Surgery

## 2013-03-03 ENCOUNTER — Ambulatory Visit: Payer: 59 | Admitting: *Deleted

## 2013-03-25 ENCOUNTER — Encounter: Payer: Self-pay | Admitting: Internal Medicine

## 2013-05-30 ENCOUNTER — Encounter: Payer: 59 | Admitting: Internal Medicine

## 2013-08-05 ENCOUNTER — Ambulatory Visit (AMBULATORY_SURGERY_CENTER): Payer: Self-pay

## 2013-08-05 VITALS — Ht 68.0 in | Wt 212.0 lb

## 2013-08-05 DIAGNOSIS — Z1211 Encounter for screening for malignant neoplasm of colon: Secondary | ICD-10-CM

## 2013-08-05 MED ORDER — MOVIPREP 100 G PO SOLR: ORAL | Status: DC

## 2013-08-13 ENCOUNTER — Ambulatory Visit (AMBULATORY_SURGERY_CENTER): Payer: 59 | Admitting: Internal Medicine

## 2013-08-13 ENCOUNTER — Encounter: Payer: Self-pay | Admitting: Internal Medicine

## 2013-08-13 VITALS — BP 122/74 | HR 57 | Temp 97.4°F | Resp 9 | Ht 68.0 in | Wt 212.0 lb

## 2013-08-13 DIAGNOSIS — D126 Benign neoplasm of colon, unspecified: Secondary | ICD-10-CM

## 2013-08-13 DIAGNOSIS — Z1211 Encounter for screening for malignant neoplasm of colon: Secondary | ICD-10-CM

## 2013-08-13 MED ORDER — SODIUM CHLORIDE 0.9 % IV SOLN: 500.0000 mL | INTRAVENOUS | Status: DC

## 2013-08-13 NOTE — Op Note (Signed)
[facility] [address] [place], [address]   COLONOSCOPY PROCEDURE REPORT  PATIENT: Virgen, Belland  MR#: [id number] BIRTHDATE: 07-Oct-1961 , 52  yrs. old GENDER: Female ENDOSCOPIST: Beverley Fiedler, MD REFERRED BY: Gaylene Brooks PROCEDURE DATE:  08/13/2013 PROCEDURE:   Colonoscopy with snare polypectomy and Colonoscopy with cold biopsy polypectomy First Screening Colonoscopy - Avg.  risk and is 50 yrs.  old or older Yes.  Prior Negative Screening - Now for repeat screening. N/A  History of Adenoma - Now for follow-up colonoscopy & has been > or = to 3 yrs.  N/A  Polyps Removed Today? Yes. ASA CLASS:   Class II INDICATIONS:average risk screening and first colonoscopy. MEDICATIONS: MAC sedation, administered by CRNA and propofol (Diprivan) 300mg  IV  DESCRIPTION OF PROCEDURE:   After the risks benefits and alternatives of the procedure were thoroughly explained, informed consent was obtained.  A digital rectal exam revealed no rectal mass.   The LB [id number]-[id number]  endoscope was introduced through the anus and advanced to the cecum, which was identified by both the appendix and ileocecal valve. No adverse events experienced. The quality of the prep was good, using MoviPrep  The instrument was then slowly withdrawn as the colon was fully examined.      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  The opening was medium sized.   Three sessile polyps ranging between 3-72mm in size were found in the ascending colon, sigmoid colon, and rectum. Polypectomy was performed with cold forceps (1) and using cold snare (2).  All resections were complete and all polyp tissue was completely retrieved.   The colon mucosa was otherwise normal. Retroflexed views revealed no abnormalities. The time to cecum=1 minutes 32 seconds.  Withdrawal time=15 minutes 53 seconds.  The scope was withdrawn and the procedure completed.  COMPLICATIONS: There were no  complications.  ENDOSCOPIC IMPRESSION: 1.   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   Three sessile polyps ranging between 3-19mm in size were found in the ascending colon, sigmoid colon, and rectum; Polypectomy was performed with cold forceps and using cold snare 3.   The colon mucosa was otherwise normal  RECOMMENDATIONS: 1.  Await pathology results 2.  Timing of repeat colonoscopy will be determined by pathology findings. 3.  You will receive a letter within 1-2 weeks with the results of your biopsy as well as final recommendations.  Please call my office if you have not received a letter after 3 weeks.   eSigned:  Beverley Fiedler, MD 08/13/2013 9:09 AM   cc: The Patient; Gaylene Brooks   PATIENT NAME:  Ashrita, Chrismer MR#: [id number]

## 2013-08-13 NOTE — Progress Notes (Signed)
Patient did not experience any of the following events: a burn prior to discharge; a fall within the facility; wrong site/side/patient/procedure/implant event; or a hospital transfer or hospital admission upon discharge from the facility. (G8907)Patient did not have preoperative order for IV antibiotic SSI prophylaxis. (G8918) ewm 

## 2013-08-13 NOTE — Progress Notes (Signed)
Called to room to assist during endoscopic procedure.  Patient ID and intended procedure confirmed with present staff. Received instructions for my participation in the procedure from the performing physician.  

## 2013-08-13 NOTE — Patient Instructions (Signed)
YOU HAD AN ENDOSCOPIC PROCEDURE TODAY AT THE [facility]: Refer to the procedure report that was given to you for any specific questions about what was found during the examination.  If the procedure report does not answer your questions, please call your gastroenterologist to clarify.  If you requested that your care partner not be given the details of your procedure findings, then the procedure report has been included in a sealed envelope for you to review at your convenience later.  YOU SHOULD EXPECT: Some feelings of bloating in the abdomen. Passage of more gas than usual.  Walking can help get rid of the air that was put into your GI tract during the procedure and reduce the bloating. If you had a lower endoscopy (such as a colonoscopy or flexible sigmoidoscopy) you may notice spotting of blood in your stool or on the toilet paper. If you underwent a bowel prep for your procedure, then you may not have a normal bowel movement for a few days.  DIET: Your first meal following the procedure should be a light meal and then it is ok to progress to your normal diet.  A half-sandwich or bowl of soup is an example of a good first meal.  Heavy or fried foods are harder to digest and may make you feel nauseous or bloated.  Likewise meals heavy in dairy and vegetables can cause extra gas to form and this can also increase the bloating.  Drink plenty of fluids but you should avoid alcoholic beverages for 24 hours.  ACTIVITY: Your care partner should take you home directly after the procedure.  You should plan to take it easy, moving slowly for the rest of the day.  You can resume normal activity the day after the procedure however you should NOT DRIVE or use heavy machinery for 24 hours (because of the sedation medicines used during the test).    SYMPTOMS TO REPORT IMMEDIATELY: A gastroenterologist can be reached at any hour.  During normal business hours, 8:30 AM to 5:00 PM Monday through Friday,  call [phone].  After hours and on weekends, please call the GI answering service at [phone]  emergency number who will take a message and have the physician on call contact you.   Following lower endoscopy (colonoscopy or flexible sigmoidoscopy):  Excessive amounts of blood in the stool  Significant tenderness or worsening of abdominal pains  Swelling of the abdomen that is new, acute  Fever of 100F or higher  FOLLOW UP: If any biopsies were taken you will be contacted by phone or by letter within the next 1-3 weeks.  Call your gastroenterologist if you have not heard about the biopsies in 3 weeks.  Our staff will call the home number listed on your records the next business day following your procedure to check on you and address any questions or concerns that you may have at that time regarding the information given to you following your procedure. This is a courtesy call and so if there is no answer at the home number and we have not heard from you through the emergency physician on call, we will assume that you have returned to your regular daily activities without incident.  SIGNATURES/CONFIDENTIALITY: You and/or your care partner have signed paperwork which will be entered into your electronic medical record.  These signatures attest to the fact that that the information above on your After Visit Summary has been reviewed and is understood.  Full responsibility of the  confidentiality of this discharge information lies with you and/or your care-partner.  Handouts on polyps, diverticulosis, high fiber diet

## 2013-08-13 NOTE — Progress Notes (Signed)
Lidocaine-40mg IV prior to Propofol InductionPropofol given over incremental dosages 

## 2013-08-14 ENCOUNTER — Telehealth: Payer: Self-pay

## 2013-08-14 NOTE — Telephone Encounter (Signed)
Left a message on the pt's answering machine [phone] for the pt to call if any questions or concerns. Maw

## 2013-08-18 ENCOUNTER — Encounter: Payer: Self-pay | Admitting: Internal Medicine

## 2013-08-29 ENCOUNTER — Encounter: Payer: Self-pay | Admitting: *Deleted

## 2013-10-08 ENCOUNTER — Ambulatory Visit (INDEPENDENT_AMBULATORY_CARE_PROVIDER_SITE_OTHER): Payer: 59 | Admitting: Physician Assistant

## 2013-10-08 ENCOUNTER — Encounter: Payer: Self-pay | Admitting: Physician Assistant

## 2013-10-08 VITALS — BP 118/82 | HR 71 | Temp 97.8°F | Resp 16 | Ht 67.5 in | Wt 210.5 lb

## 2013-10-08 DIAGNOSIS — Z0000 Encounter for general adult medical examination without abnormal findings: Secondary | ICD-10-CM

## 2013-10-08 DIAGNOSIS — K219 Gastro-esophageal reflux disease without esophagitis: Secondary | ICD-10-CM

## 2013-10-08 DIAGNOSIS — E039 Hypothyroidism, unspecified: Secondary | ICD-10-CM

## 2013-10-08 NOTE — Patient Instructions (Signed)
Please return for labs.  I will call you with your results.  We will adjust medication doses as indicated.

## 2013-10-08 NOTE — Progress Notes (Signed)
Patient ID: Laurie Krueger, female   DOB: [birth year], 52 y.o.   MRN: [id number]  Patient presents to clinic to establish care.  Acute Concerns: Requesting BMP for routine screening following gastric sleeve.  Chronic Issues: GERD -- improved with weight loss 2/2 gastric sleeve patient.  Takes protonix.  Monitors diet.  Anxiety and Depression -- controlled with amitriptyline and citalopram.  Denies SI/HI.  Hypothyroidism -- currently on Synthroid.  Needs repeat TSH and T4 levels.  Health Maintenance: Dental -- UTD Vision -- UTD Immunizations -- UTD Colonoscopy -- 2014, polyps; repeat in 5 years Mammogram -- UTD last in 2013. Is scheduling mammogram for 01/15 PAP -- 2012; wnl.   Past Medical History  Diagnosis Date  . Diabetes mellitus     Resolved w/Gastric Sleeve  . Thyroid disease   . Hyperlipidemia     Had Gastric Sleeve  . Obesity     Had Gastric Sleeve  . MVP (mitral valve prolapse)     hx. of [>30 yrs]  . Hypothyroidism   . History of kidney stones   . Chicken pox     Current Outpatient Prescriptions on File Prior to Visit  Medication Sig Dispense Refill  . amitriptyline (ELAVIL) 10 MG tablet Take 10 mg by mouth at bedtime.      . citalopram (CELEXA) 20 MG tablet Take 20 mg by mouth every morning.       . ondansetron (ZOFRAN ODT) 4 MG disintegrating tablet Take 1 tablet (4 mg total) by mouth every 8 (eight) hours as needed for nausea.  40 tablet  0  . pantoprazole (PROTONIX) 40 MG tablet Take 1 tablet (40 mg total) by mouth daily.  30 tablet  3   No current facility-administered medications on file prior to visit.    Allergies  Allergen Reactions  . Codeine Nausea And Vomiting    Family History  Problem Relation Age of Onset  . Cancer Other   . Ulcerative colitis Other   . Lung cancer Mother   . Colitis Mother   . Heart disease Mother   . Lung cancer Father   . Heart disease Father   . Alzheimer's disease Maternal Grandmother   . Heart attack Maternal  Grandfather   . Heart attack Paternal Grandfather   . Breast cancer Maternal Aunt   . Alzheimer's disease Maternal Aunt   . Hypothyroidism Mother   . Stroke Paternal Aunt   . Healthy Sister     x3  . Healthy Daughter     x3  . Graves' disease Son     History   Social History  . Marital Status: Married    Spouse Name: N/A    Number of Children: N/A  . Years of Education: N/A   Social History Main Topics  . Smoking status: Never Smoker   . Smokeless tobacco: Never Used  . Alcohol Use: No     Comment: rare occ.  . Drug Use: No  . Sexual Activity: Yes   Other Topics Concern  . None   Social History Narrative  . None   Review of Systems  Constitutional: Negative for fever and weight loss.  HENT: Negative for ear discharge, ear pain, hearing loss and tinnitus.   Eyes: Negative for blurred vision, double vision, photophobia and pain.  Respiratory: Negative for cough and shortness of breath.   Cardiovascular: Negative for chest pain and palpitations.  Gastrointestinal: Positive for heartburn and nausea. Negative for vomiting, abdominal pain, diarrhea, constipation, blood in stool  and melena.  Genitourinary: Negative for dysuria, urgency, frequency, hematuria and flank pain.  Musculoskeletal: Negative for joint pain.  Neurological: Negative for dizziness, seizures, loss of consciousness and headaches.  Endo/Heme/Allergies: Negative for environmental allergies.  Psychiatric/Behavioral: Negative for depression, suicidal ideas, hallucinations and substance abuse. The patient has insomnia. The patient is not nervous/anxious.    Filed Vitals:   10/08/13 1443  BP: 118/82  Pulse: 71  Temp: 97.8 F (36.6 C)  Resp: 16   Physical Exam  Vitals reviewed. Constitutional: She is oriented to person, place, and time and well-developed, well-nourished, and in no distress.  HENT:  Head: Normocephalic and atraumatic.  Right Ear: External ear normal.  Left Ear: External ear normal.   Nose: Nose normal.  Mouth/Throat: Oropharynx is clear and moist. No oropharyngeal exudate.  TM within normal limits bilaterally.  Eyes: Conjunctivae and EOM are normal. Pupils are equal, round, and reactive to light.  Neck: Neck supple.  Cardiovascular: Normal rate, regular rhythm, normal heart sounds and intact distal pulses.   Pulmonary/Chest: Effort normal and breath sounds normal. No respiratory distress. She has no wheezes. She has no rales. She exhibits no tenderness.  Abdominal: Soft. Bowel sounds are normal. She exhibits no distension and no mass. There is no tenderness. There is no rebound and no guarding.  Musculoskeletal: Normal range of motion.  Lymphadenopathy:    She has no cervical adenopathy.  Neurological: She is alert and oriented to person, place, and time. No cranial nerve deficit.  Skin: Skin is warm and dry. No rash noted.  Psychiatric: Affect normal.   Assessment/Plan: GERD (gastroesophageal reflux disease) Continue Protonix as prescribed.  Unspecified hypothyroidism TSH and Free T4.  Will alter medication dose if indicated.  Visit for preventive health examination Health Maintenance UTD. Will obtain fasting lab.  Patient is scheduling mammogram.

## 2013-10-08 NOTE — Progress Notes (Signed)
Pre visit review using our clinic review tool, if applicable. No additional management support is needed unless otherwise documented below in the visit note/SLS  

## 2013-10-10 LAB — CBC WITH DIFFERENTIAL/PLATELET
Basophils Absolute: 0.1 10*3/uL (ref 0.0–0.1)
Basophils Relative: 1 % (ref 0–1)
Eosinophils Relative: 5 % (ref 0–5)
HCT: 37.1 % (ref 36.0–46.0)
Hemoglobin: 12.8 g/dL (ref 12.0–15.0)
Lymphocytes Relative: 43 % (ref 12–46)
Lymphs Abs: 1.9 10*3/uL (ref 0.7–4.0)
MCH: 29.5 pg (ref 26.0–34.0)
MCHC: 34.5 g/dL (ref 30.0–36.0)
Monocytes Relative: 7 % (ref 3–12)
Neutro Abs: 2 10*3/uL (ref 1.7–7.7)
Neutrophils Relative %: 44 % (ref 43–77)
RBC: 4.34 MIL/uL (ref 3.87–5.11)
WBC: 4.5 10*3/uL (ref 4.0–10.5)

## 2013-10-10 LAB — URINALYSIS, MICROSCOPIC ONLY
Casts: NONE SEEN
Crystals: NONE SEEN

## 2013-10-10 LAB — HEPATIC FUNCTION PANEL
AST: 15 U/L (ref 0–37)
Albumin: 3.9 g/dL (ref 3.5–5.2)
Alkaline Phosphatase: 47 U/L (ref 39–117)
Bilirubin, Direct: 0.2 mg/dL (ref 0.0–0.3)
Indirect Bilirubin: 0.7 mg/dL (ref 0.0–0.9)
Total Protein: 6.4 g/dL (ref 6.0–8.3)

## 2013-10-10 LAB — BASIC METABOLIC PANEL WITH GFR
BUN: 9 mg/dL (ref 6–23)
Chloride: 101 mEq/L (ref 96–112)
Creat: 0.7 mg/dL (ref 0.50–1.10)
GFR, Est African American: >89 mL/min
GFR, Est Non African American: >89 mL/min
Glucose, Bld: 129 mg/dL — ABNORMAL HIGH (ref 70–99)
Potassium: 4.3 mEq/L (ref 3.5–5.3)
Sodium: 138 mEq/L (ref 135–145)

## 2013-10-10 LAB — URINALYSIS, ROUTINE W REFLEX MICROSCOPIC
Glucose, UA: NEGATIVE mg/dL
Ketones, ur: NEGATIVE mg/dL
Nitrite: NEGATIVE
Specific Gravity, Urine: 1.014 (ref 1.005–1.030)
pH: 5.5 (ref 5.0–8.0)

## 2013-10-10 LAB — LIPID PANEL
Cholesterol: 216 mg/dL — ABNORMAL HIGH (ref 0–200)
HDL: 60 mg/dL (ref >39)
Triglycerides: 91 mg/dL (ref <150)

## 2013-10-10 LAB — HEMOGLOBIN A1C: Hgb A1c MFr Bld: 7.1 % — ABNORMAL HIGH (ref <5.7)

## 2013-10-10 LAB — T4, FREE: Free T4: 0.78 ng/dL — ABNORMAL LOW (ref 0.80–1.80)

## 2013-10-12 ENCOUNTER — Telehealth: Payer: Self-pay | Admitting: Physician Assistant

## 2013-10-12 DIAGNOSIS — E039 Hypothyroidism, unspecified: Secondary | ICD-10-CM

## 2013-10-12 DIAGNOSIS — E785 Hyperlipidemia, unspecified: Secondary | ICD-10-CM

## 2013-10-12 DIAGNOSIS — R7309 Other abnormal glucose: Secondary | ICD-10-CM

## 2013-10-12 MED ORDER — ATORVASTATIN CALCIUM 10 MG PO TABS: 10.0000 mg | ORAL_TABLET | Freq: Every day | ORAL | Status: DC

## 2013-10-12 MED ORDER — LEVOTHYROXINE SODIUM 150 MCG PO TABS: 150.0000 ug | ORAL_TABLET | Freq: Every day | ORAL | Status: DC

## 2013-10-12 NOTE — Telephone Encounter (Signed)
Please inform patient that overall labs are good.  Her urine showed possible evidence of infection.  However under microscope there were several epithelial cells, indicating a bad catch and contaminated specimen.  If she were to develop urinary urgency, frequency, dysuria or hematuria, she should return to clinic for repeat sample.  Also her LDL cholesterol is mildly elevated at 138, increased from 70 last year.  Because she is diabetic, her LDL cholesterol needs to be under 100.  I would like for her to begin Lipitor 10 mg daily.  I would like to see her in 3 months to recheck her cholesterol and liver functions.  Most common side effect is joint or muscle pain.  If she experiences either of these, she should stop the medication and call the office.  Also, she was correct about her thyroid.  TSH is elevated.  Will need to increase her synthroid dose to 150 mcg daily.  I would like for her to return to clinic in 4-6 weeks to repeat TSH and assess need for further dose change.

## 2013-10-14 DIAGNOSIS — Z0000 Encounter for general adult medical examination without abnormal findings: Secondary | ICD-10-CM | POA: Insufficient documentation

## 2013-10-14 DIAGNOSIS — E039 Hypothyroidism, unspecified: Secondary | ICD-10-CM | POA: Insufficient documentation

## 2013-10-14 DIAGNOSIS — K219 Gastro-esophageal reflux disease without esophagitis: Secondary | ICD-10-CM | POA: Insufficient documentation

## 2013-10-14 NOTE — Telephone Encounter (Signed)
Patient would like lab results faxed to [phone]  She can be reached before 3 today @[phone]

## 2013-10-14 NOTE — Assessment & Plan Note (Signed)
Health Maintenance UTD. Will obtain fasting lab.  Patient is scheduling mammogram.

## 2013-10-14 NOTE — Assessment & Plan Note (Signed)
   Continue Protonix as prescribed.  

## 2013-10-14 NOTE — Assessment & Plan Note (Signed)
TSH and Free T4.  Will alter medication dose if indicated.

## 2013-10-15 MED ORDER — ROSUVASTATIN CALCIUM 5 MG PO TABS: 5.0000 mg | ORAL_TABLET | Freq: Every day | ORAL | Status: DC

## 2013-10-15 MED ORDER — METFORMIN HCL 500 MG PO TABS: 500.0000 mg | ORAL_TABLET | Freq: Every day | ORAL | Status: DC

## 2013-10-15 NOTE — Telephone Encounter (Signed)
Rx Crestor 5 mg daily.  Rx Metformin 500 mg QAM.

## 2013-10-15 NOTE — Telephone Encounter (Signed)
Patient informed, understood & agreed/SLS (1) Patient does not want to take Lipitor & request Crestor [either 5 or 10 mg] to be sent to pharmacy  (2) Patient states that her A1C was at 5.6% after her surgery [and has not been given Dx: of Diabetes], but we do not have these levels d/t being done outside of [facility]; informed of current result and patient request to have a Once Daily medication sent to pharmacy [in conjunction with continued weight loss & lifestyle change] until she can lose more weight, at which she feels will put her back within goal range Please Advise/SLS

## 2013-10-20 NOTE — Telephone Encounter (Signed)
LMOM with contact name and number RE: medications requested to pharmacy/SLS

## 2013-12-19 ENCOUNTER — Other Ambulatory Visit: Payer: Self-pay | Admitting: Physician Assistant

## 2013-12-19 NOTE — Telephone Encounter (Signed)
Sent 30 day supply levothyroxine to pharmacy. Pt had last TSH 09/2013 and advised to repeat level in 4-6 weeks. Please call pt to return to the lab for TSH and let us know when she thinks she will complete bloodwork so we can enter order.

## 2013-12-22 NOTE — Telephone Encounter (Signed)
Left message for patient to return my call.

## 2013-12-25 NOTE — Telephone Encounter (Signed)
Left message for patient to return my call.

## 2013-12-25 NOTE — Telephone Encounter (Signed)
Informed patient of medication refill and she states that she will come in for labs week of 01/05/14

## 2014-04-27 ENCOUNTER — Telehealth: Payer: Self-pay | Admitting: Physician Assistant

## 2014-04-27 NOTE — Telephone Encounter (Signed)
Refill- cyanocobalamin  [facility]

## 2014-04-28 NOTE — Telephone Encounter (Signed)
Please advise? I don't see this on patients med list?

## 2014-04-28 NOTE — Telephone Encounter (Signed)
Could you please contact pharmacy and ask who prescribed last and at what dose?

## 2014-04-29 NOTE — Telephone Encounter (Signed)
Per conversation with pharmacy, pt was receiving requested Rx last from Dr. Annia Belt; search shows that she is OB/GYN in [place].

## 2014-05-04 NOTE — Telephone Encounter (Signed)
Left message on pharmacy voicemail to verify dose and frequency.

## 2014-05-05 MED ORDER — CYANOCOBALAMIN 1000 MCG/ML IJ KIT: 1000.0000 ug | PACK | INTRAMUSCULAR | Status: DC

## 2014-05-05 NOTE — Telephone Encounter (Signed)
OK to send in a 52-month supply. Thank you.

## 2014-05-05 NOTE — Telephone Encounter (Signed)
Rx sent 

## 2014-05-05 NOTE — Telephone Encounter (Signed)
Per Ailene Ravel, pt has been getting 1078mcg injection once a month. Med list updated. Spoke with pt, she states she currently has syringes and just needs the medication as she self administers the medication. Is this ok to send Rx?

## 2014-07-27 ENCOUNTER — Other Ambulatory Visit: Payer: Self-pay | Admitting: *Deleted

## 2014-07-27 MED ORDER — PANTOPRAZOLE SODIUM 40 MG PO TBEC: 40.0000 mg | DELAYED_RELEASE_TABLET | Freq: Every day | ORAL | Status: DC

## 2014-07-27 NOTE — Telephone Encounter (Signed)
Rx request to pharmacy/SLS  

## 2014-07-29 ENCOUNTER — Other Ambulatory Visit: Payer: Self-pay | Admitting: *Deleted

## 2014-07-29 MED ORDER — ONDANSETRON HCL 8 MG PO TABS: 8.0000 mg | ORAL_TABLET | Freq: Three times a day (TID) | ORAL | Status: DC | PRN

## 2014-07-29 NOTE — Telephone Encounter (Signed)
Rx request to pharmacy/SLS Requested drug refills are authorized, however, the patient needs further evaluation and/or laboratory testing before further refills are given. Ask her to make an appointment for this.  

## 2014-09-23 ENCOUNTER — Emergency Department (HOSPITAL_COMMUNITY): Payer: 59

## 2014-09-23 ENCOUNTER — Encounter (HOSPITAL_COMMUNITY): Payer: Self-pay | Admitting: Emergency Medicine

## 2014-09-23 ENCOUNTER — Observation Stay (HOSPITAL_COMMUNITY)
Admission: EM | Admit: 2014-09-23 | Discharge: 2014-09-24 | Disposition: A | Discharge status: Home / Self Care | Payer: 59 | Attending: Internal Medicine | Admitting: Internal Medicine

## 2014-09-23 DIAGNOSIS — E785 Hyperlipidemia, unspecified: Secondary | ICD-10-CM | POA: Diagnosis not present

## 2014-09-23 DIAGNOSIS — Z6832 Body mass index (BMI) 32.0-32.9, adult: Secondary | ICD-10-CM | POA: Diagnosis not present

## 2014-09-23 DIAGNOSIS — Z8249 Family history of ischemic heart disease and other diseases of the circulatory system: Secondary | ICD-10-CM | POA: Diagnosis not present

## 2014-09-23 DIAGNOSIS — I10 Essential (primary) hypertension: Secondary | ICD-10-CM | POA: Diagnosis not present

## 2014-09-23 DIAGNOSIS — K219 Gastro-esophageal reflux disease without esophagitis: Secondary | ICD-10-CM | POA: Insufficient documentation

## 2014-09-23 DIAGNOSIS — E038 Other specified hypothyroidism: Secondary | ICD-10-CM

## 2014-09-23 DIAGNOSIS — R0789 Other chest pain: Secondary | ICD-10-CM | POA: Diagnosis present

## 2014-09-23 DIAGNOSIS — E669 Obesity, unspecified: Secondary | ICD-10-CM | POA: Insufficient documentation

## 2014-09-23 DIAGNOSIS — R079 Chest pain, unspecified: Secondary | ICD-10-CM | POA: Diagnosis present

## 2014-09-23 DIAGNOSIS — Z888 Allergy status to other drugs, medicaments and biological substances status: Secondary | ICD-10-CM | POA: Diagnosis not present

## 2014-09-23 DIAGNOSIS — E119 Type 2 diabetes mellitus without complications: Secondary | ICD-10-CM | POA: Diagnosis not present

## 2014-09-23 DIAGNOSIS — E039 Hypothyroidism, unspecified: Secondary | ICD-10-CM | POA: Diagnosis not present

## 2014-09-23 DIAGNOSIS — Z0000 Encounter for general adult medical examination without abnormal findings: Secondary | ICD-10-CM

## 2014-09-23 HISTORY — DX: Gastro-esophageal reflux disease without esophagitis: K21.9

## 2014-09-23 LAB — I-STAT TROPONIN, ED: TROPONIN I, POC: 0 ng/mL (ref 0.00–0.08)

## 2014-09-23 LAB — COMPREHENSIVE METABOLIC PANEL
ALT: 30 U/L (ref 0–35)
ANION GAP: 14 (ref 5–15)
AST: 62 U/L — ABNORMAL HIGH (ref 0–37)
Albumin: 3.5 g/dL (ref 3.5–5.2)
Alkaline Phosphatase: 92 U/L (ref 39–117)
BUN: 11 mg/dL (ref 6–23)
CALCIUM: 9.3 mg/dL (ref 8.4–10.5)
CO2: 24 mEq/L (ref 19–32)
Chloride: 99 mEq/L (ref 96–112)
Creatinine, Ser: 0.89 mg/dL (ref 0.50–1.10)
GFR calc non Af Amer: 72 mL/min — ABNORMAL LOW (ref >90)
GFR, EST AFRICAN AMERICAN: 84 mL/min — AB (ref >90)
GLUCOSE: 232 mg/dL — AB (ref 70–99)
Potassium: 3.9 mEq/L (ref 3.7–5.3)
SODIUM: 137 meq/L (ref 137–147)
Total Bilirubin: 0.5 mg/dL (ref 0.3–1.2)
Total Protein: 6.7 g/dL (ref 6.0–8.3)

## 2014-09-23 LAB — CBC
HCT: 35.2 % — ABNORMAL LOW (ref 36.0–46.0)
HEMOGLOBIN: 12 g/dL (ref 12.0–15.0)
MCH: 29.6 pg (ref 26.0–34.0)
MCHC: 34.1 g/dL (ref 30.0–36.0)
MCV: 86.7 fL (ref 78.0–100.0)
PLATELETS: 197 10*3/uL (ref 150–400)
RBC: 4.06 MIL/uL (ref 3.87–5.11)
RDW: 12.5 % (ref 11.5–15.5)
WBC: 7.4 10*3/uL (ref 4.0–10.5)

## 2014-09-23 LAB — PRO B NATRIURETIC PEPTIDE: Pro B Natriuretic peptide (BNP): <5 pg/mL (ref 0–125)

## 2014-09-23 MED ORDER — NITROGLYCERIN 0.4 MG SL SUBL: 0.4000 mg | SUBLINGUAL_TABLET | SUBLINGUAL | Status: DC | PRN

## 2014-09-23 NOTE — ED Provider Notes (Signed)
CSN: [id number]     Arrival date & time 09/23/14  2108 History   First MD Initiated Contact with Patient 09/23/14 2112     Chief Complaint  Patient presents with  . Chest Pain     (Consider location/radiation/quality/duration/timing/severity/associated sxs/prior Treatment) HPI Patient is a 53 year old female with history of diabetes and GERD, who presents complaining of chest pain. She states that approximately an hour prior to arrival here she had a sudden onset while at rest of substernal chest pain that she describes as pressure. She says she has never had similar pain before. She denies any radiation to the pain. She said the pain was 10 out of 10 at its worst. She had associated shortness of breath and diaphoresis. She took 324 mg of aspirin, and one nitroglycerin that belonged to her mother prior to calling 911. Upon EMS arrival, the patient still had significant, 10 out of 10 chest pain, which was completely relieved with 2 nitroglycerin. The pain did not return. Prehospital EKG had no ischemic changes.   Past Medical History  Diagnosis Date  . Diabetes mellitus     Resolved w/Gastric Sleeve  . Thyroid disease   . Hyperlipidemia     Had Gastric Sleeve  . Obesity     Had Gastric Sleeve  . MVP (mitral valve prolapse)     hx. of [>30 yrs]  . Hypothyroidism   . History of kidney stones   . Chicken pox    Past Surgical History  Procedure Laterality Date  . Abdominal hysterectomy    . Tonsillectomy    . Kidney stone surgery    . Knee arthroscopy  02/2012    meniscus repair left 5'13  . Laparoscopic gastric sleeve resection  10/08/2012    Procedure: LAPAROSCOPIC GASTRIC SLEEVE RESECTION;  Surgeon: Madilyn Hook, DO;  Location: [facility];  Service: General;  Laterality: N/A;  . Cesarean section  1995   Family History  Problem Relation Age of Onset  . Cancer Other   . Ulcerative colitis Other   . Lung cancer Mother   . Colitis Mother   . Heart disease Mother   . Lung cancer  Father   . Heart disease Father   . Alzheimer's disease Maternal Grandmother   . Heart attack Maternal Grandfather   . Heart attack Paternal Grandfather   . Breast cancer Maternal Aunt   . Alzheimer's disease Maternal Aunt   . Hypothyroidism Mother   . Stroke Paternal Aunt   . Healthy Sister     x3  . Healthy Daughter     x3  . Graves' disease Son    History  Substance Use Topics  . Smoking status: Never Smoker   . Smokeless tobacco: Never Used  . Alcohol Use: No     Comment: rare occ.   OB History    No data available     Review of Systems  Constitutional: Positive for diaphoresis.  Respiratory: Positive for chest tightness and shortness of breath.   Cardiovascular: Positive for chest pain and palpitations.  Gastrointestinal: Negative for abdominal pain and anal bleeding.  All other systems reviewed and are negative.     Allergies  Codeine and Morphine and related  Home Medications   Prior to Admission medications   Medication Sig Start Date End Date Taking? Authorizing Provider  amitriptyline (ELAVIL) 10 MG tablet Take 10 mg by mouth at bedtime.   Yes Historical Provider, MD  BYDUREON 2 MG SUSR Inject 2 mg into the skin  once a week. On Sunday 08/28/14  Yes Historical Provider, MD  Calcium Carbonate-Vitamin D (CALCIUM-VITAMIN D3 PO) Take 1 each by mouth every other day.   Yes Historical Provider, MD  celecoxib (CELEBREX) 100 MG capsule Take 100 mg by mouth 2 (two) times daily as needed. 08/28/14  Yes Historical Provider, MD  levothyroxine (SYNTHROID) 175 MCG tablet Take 175 mcg by mouth daily before breakfast. Brand ONLY   Yes Historical Provider, MD  pantoprazole (PROTONIX) 40 MG tablet Take 1 tablet (40 mg total) by mouth daily. 07/27/14  Yes Brunetta Jeans, PA-C  Cyanocobalamin 1000 MCG/ML KIT Inject 1,000 mcg as directed every 30 (thirty) days. Patient not taking: Reported on 09/23/2014 05/05/14   Brunetta Jeans, PA-C  metFORMIN (GLUCOPHAGE) 500 MG tablet Take 1  tablet (500 mg total) by mouth daily with breakfast. Patient not taking: Reported on 09/23/2014 10/15/13   Brunetta Jeans, PA-C  ondansetron (ZOFRAN ODT) 4 MG disintegrating tablet Take 1 tablet (4 mg total) by mouth every 8 (eight) hours as needed for nausea. Patient not taking: Reported on 09/23/2014 11/07/12   Madilyn Hook, DO  ondansetron (ZOFRAN) 8 MG tablet Take 1 tablet (8 mg total) by mouth every 8 (eight) hours as needed for nausea or vomiting. Patient not taking: Reported on 09/23/2014 07/29/14   Brunetta Jeans, PA-C  rosuvastatin (CRESTOR) 5 MG tablet Take 1 tablet (5 mg total) by mouth daily. Patient not taking: Reported on 09/23/2014 10/15/13   Brunetta Jeans, PA-C  SYNTHROID 150 MCG tablet TAKE 1 TABLET BY MOUTH ONCE DAILY BEFORE BREAKFAST Patient not taking: Reported on 09/23/2014 12/19/13   Brunetta Jeans, PA-C   BP 131/72 mmHg  Pulse 89  Resp 24  Ht _0  (1.702 m)  Wt 210 lb (95.255 kg)  BMI 32.88 kg/m2  SpO2 99% Physical Exam  Constitutional: She is oriented to person, place, and time. She appears well-developed and well-nourished.  HENT:  Head: Normocephalic and atraumatic.  Eyes: Conjunctivae are normal. Pupils are equal, round, and reactive to light.  Neck: Normal range of motion. Neck supple.  Cardiovascular: Normal rate and regular rhythm.   Pulmonary/Chest: Effort normal and breath sounds normal.  Abdominal: Soft. Bowel sounds are normal.  Musculoskeletal: Normal range of motion.  Neurological: She is alert and oriented to person, place, and time.  Skin: Skin is warm and dry.  Psychiatric: She has a normal mood and affect.  Nursing note and vitals reviewed.   ED Course  Procedures (including critical care time) Labs Review Labs Reviewed  COMPREHENSIVE METABOLIC PANEL - Abnormal; Notable for the following:    Glucose, Bld 232 (*)    AST 62 (*)    GFR calc non Af Amer 72 (*)    GFR calc Af Amer 84 (*)    All other components within normal limits  CBC  - Abnormal; Notable for the following:    HCT 35.2 (*)    All other components within normal limits  PRO B NATRIURETIC PEPTIDE  I-STAT TROPOININ, ED    Imaging Review Dg Chest 2 View  09/23/2014   CLINICAL DATA:  Midsternal chest pain, shortness of breast starting today  EXAM: CHEST  2 VIEW  COMPARISON:  None.  FINDINGS: Cardiomediastinal silhouette is unremarkable. No acute infiltrate or pleural effusion. No pulmonary edema. Minimal degenerative changes mid thoracic spine.  IMPRESSION: No active cardiopulmonary disease.   Electronically Signed   By: Lahoma Crocker M.D.   On: 09/23/2014 22:07  EKG Interpretation None      MDM   Final diagnoses:  Chest pain, unspecified chest pain type   53 year old female residing with chest pain. History very concerning for angina, HEART score 5, Moderate 60 day risk for major cardiac event.  No acute ischemic EKG changes, negative troponin. Chest x-ray does not show any signs of pneumothorax, pneumonia, widened mediastinum. She does not have any symptoms suggestive of GERD.   We will plan to admit for ACS rule out.  Leata Mouse, MD 09/24/14 6837  Ezequiel Essex, MD 09/24/14 [phone]

## 2014-09-23 NOTE — H&P (Signed)
PCP:  [name], PA-C    Chief Complaint:   Elephant sitting on her chest  HPI: Laurie Krueger is a 53 y.o. female   has a past medical history of Diabetes mellitus; Thyroid disease; Hyperlipidemia; Obesity; MVP (mitral valve prolapse); Hypothyroidism; History of kidney stones; and Chicken pox.   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 He drove to her mother's house to get aspirin. It did not improve with aspirin but improved with Nitro x2. She was seen at [facility] ER trop and ECG unremarkable. BG  Was elevated  232. Currently chest pain free.  Pain did not radiate. She does not endorse extra coffee.   She has occasional indigestion after having Gastric sleeve procedure but this was different. Never had any stress test. Both parents had CAD/MI in late 29 and for mother 37  Hospitalist was called for admission for chest pain  Review of Systems:    Pertinent positives include:   chest pain,  shortness of breath at rest.  Constitutional:  No weight loss, night sweats, Fevers, chills, fatigue, weight loss  HEENT:  No headaches, Difficulty swallowing,Tooth/dental problems,Sore throat,  No sneezing, itching, ear ache, nasal congestion, post nasal drip,  Cardio-vascular:  No Orthopnea, PND, anasarca, dizziness, palpitations.no Bilateral lower extremity swelling  GI:  No heartburn, indigestion, abdominal pain, nausea, vomiting, diarrhea, change in bowel habits, loss of appetite, melena, blood in stool, hematemesis Resp:  no No dyspnea on exertion, No excess mucus, no productive cough, No non-productive cough, No coughing up of blood.No change in color of mucus.No wheezing. Skin:  no rash or lesions. No jaundice GU:  no dysuria, change in color of urine, no urgency or frequency. No straining to urinate.  No flank pain.  Musculoskeletal:  No joint pain or  no joint swelling. No decreased range of motion. No back pain.  Psych:  No change in mood or affect. No depression or anxiety. No memory loss.  Neuro: no localizing neurological complaints, no tingling, no weakness, no double vision, no gait abnormality, no slurred speech, no confusion  Otherwise ROS are negative except for above, 10 systems were reviewed  Past Medical History: Past Medical History  Diagnosis Date  . Diabetes mellitus     Resolved w/Gastric Sleeve  . Thyroid disease   . Hyperlipidemia     Had Gastric Sleeve  . Obesity     Had Gastric Sleeve  . MVP (mitral valve prolapse)     hx. of [>30 yrs]  . Hypothyroidism   . History of kidney stones   . Chicken pox    Past Surgical History  Procedure Laterality Date  . Abdominal hysterectomy    . Tonsillectomy    . Kidney stone surgery    . Knee arthroscopy  02/2012    meniscus repair left 5'13  . Laparoscopic gastric sleeve resection  10/08/2012    Procedure: LAPAROSCOPIC GASTRIC SLEEVE RESECTION;  Surgeon: Madilyn Hook, DO;  Location: [facility]S;  Service: General;  Laterality: N/A;  . Cesarean section  1995     Medications: Prior to Admission medications   Medication Sig Start Date End Date Taking? Authorizing Provider  amitriptyline (ELAVIL) 10 MG tablet Take 10 mg by mouth at bedtime.   Yes Historical Provider, MD  BYDUREON 2 MG SUSR Inject 2 mg into the skin once a week. On Sunday 08/28/14  Yes Historical Provider, MD  Calcium Carbonate-Vitamin D (CALCIUM-VITAMIN D3 PO) Take 1 each by mouth every other day.   Yes Historical Provider, MD  celecoxib (CELEBREX) 100 MG capsule Take 100 mg by mouth 2 (two) times daily as needed. 08/28/14  Yes Historical Provider, MD  levothyroxine (SYNTHROID) 175 MCG tablet Take 175 mcg by mouth daily before breakfast. Brand ONLY   Yes Historical Provider, MD  pantoprazole (PROTONIX) 40 MG tablet Take 1 tablet (40 mg total) by mouth daily. 07/27/14  Yes Waldon Merl, PA-C    Cyanocobalamin 1000 MCG/ML KIT Inject 1,000 mcg as directed every 30 (thirty) days. Patient not taking: Reported on 09/23/2014 05/05/14   Waldon Merl, PA-C  metFORMIN (GLUCOPHAGE) 500 MG tablet Take 1 tablet (500 mg total) by mouth daily with breakfast. Patient not taking: Reported on 09/23/2014 10/15/13   Waldon Merl, PA-C  ondansetron (ZOFRAN ODT) 4 MG disintegrating tablet Take 1 tablet (4 mg total) by mouth every 8 (eight) hours as needed for nausea. Patient not taking: Reported on 09/23/2014 11/07/12   Lodema Pilot, DO  ondansetron (ZOFRAN) 8 MG tablet Take 1 tablet (8 mg total) by mouth every 8 (eight) hours as needed for nausea or vomiting. Patient not taking: Reported on 09/23/2014 07/29/14   Waldon Merl, PA-C  rosuvastatin (CRESTOR) 5 MG tablet Take 1 tablet (5 mg total) by mouth daily. Patient not taking: Reported on 09/23/2014 10/15/13   Waldon Merl, PA-C  SYNTHROID 150 MCG tablet TAKE 1 TABLET BY MOUTH ONCE DAILY BEFORE BREAKFAST Patient not taking: Reported on 09/23/2014 12/19/13   Waldon Merl, PA-C    Allergies:   Allergies  Allergen Reactions  . Codeine Nausea And Vomiting  . Morphine And Related Nausea And Vomiting    Social History:  Ambulatory  independently   Lives at home  With family     reports that she has never smoked. She has never used smokeless tobacco. She reports that she does not drink alcohol or use illicit drugs.    Family History: family history includes Alzheimer's disease in her maternal aunt and maternal grandmother; Breast cancer in her maternal aunt; Cancer in her other; Colitis in her mother; Luiz Blare' disease in her son; Healthy in her daughter and sister; Heart attack in her maternal grandfather and paternal grandfather; Heart disease in her father and mother; Hypothyroidism in her mother; Lung cancer in her father and mother; Stroke in her paternal aunt; Ulcerative colitis in her other.    Physical Exam: Patient Vitals for  the past 24 hrs:  BP Pulse Resp SpO2 Height Weight  09/23/14 2330 130/78 mmHg 84 16 98 % - -  09/23/14 2315 134/93 mmHg 81 20 100 % - -  09/23/14 2300 131/72 mmHg 89 24 99 % - -  09/23/14 2245 132/75 mmHg 82 22 98 % - -  09/23/14 2230 141/80 mmHg 93 22 100 % - -  09/23/14 2215 128/74 mmHg 89 23 100 % - -  09/23/14 2200 131/77 mmHg 82 12 (!) 78 % - -  09/23/14 2158 - 86 15 100 % - -  09/23/14 2157 131/71 mmHg - 14 - - -  09/23/14 2130 137/70 mmHg 89 16 98 % - -  09/23/14 2121 - - - - 5\' 7"  (1.702 m) 95.255 kg (210 lb)  09/23/14 2108 - - - 99 % - -    1. General:  in No Acute distress 2. Psychological: Alert and  Oriented 3. Head/ENT:   Moist   Mucous  Membranes                          Head Non traumatic, neck supple                          Normal Dentition 4. SKIN: decreased Skin turgor,  Skin clean Dry and intact no rash 5. Heart: Regular rate and rhythm no Murmur, Rub or gallop 6. Lungs: Clear to auscultation bilaterally, no wheezes or crackles   7. Abdomen: Soft, non-tender, Non distended 8. Lower extremities: no clubbing, cyanosis, or edema 9. Neurologically Grossly intact, moving all 4 extremities equally 10. MSK: Normal range of motion  body mass index is 32.88 kg/(m^2).   Labs on Admission:   Results for orders placed or performed during the hospital encounter of 09/23/14 (from the past 24 hour(s))  Pro b natriuretic peptide (order if patient c/o SOB ONLY)     Status: None   Collection Time: 09/23/14  9:30 PM  Result Value Ref Range   Pro B Natriuretic peptide (BNP) <5.0 0 - 125 pg/mL  Comprehensive metabolic panel     Status: Abnormal   Collection Time: 09/23/14  9:30 PM  Result Value Ref Range   Sodium 137 137 - 147 mEq/L   Potassium 3.9 3.7 - 5.3 mEq/L   Chloride 99 96 - 112 mEq/L   CO2 24 19 - 32 mEq/L   Glucose, Bld 232 (H) 70 - 99 mg/dL   BUN 11 6 - 23 mg/dL   Creatinine, Ser 7.95 0.50 - 1.10 mg/dL   Calcium 9.3 8.4 - 64.6 mg/dL   Total Protein 6.7 6.0 -  8.3 g/dL   Albumin 3.5 3.5 - 5.2 g/dL   AST 62 (H) 0 - 37 U/L   ALT 30 0 - 35 U/L   Alkaline Phosphatase 92 39 - 117 U/L   Total Bilirubin 0.5 0.3 - 1.2 mg/dL   GFR calc non Af Amer 72 (L) >90 mL/min   GFR calc Af Amer 84 (L) >90 mL/min   Anion gap 14 5 - 15  CBC     Status: Abnormal   Collection Time: 09/23/14  9:30 PM  Result Value Ref Range   WBC 7.4 4.0 - 10.5 K/uL   RBC 4.06 3.87 - 5.11 MIL/uL   Hemoglobin 12.0 12.0 - 15.0 g/dL   HCT 29.0 (L) 09.4 - 46.1 %   MCV 86.7 78.0 - 100.0 fL   MCH 29.6 26.0 - 34.0 pg   MCHC 34.1 30.0 - 36.0 g/dL   RDW 55.8 28.3 - 32.3 %   Platelets 197 150 - 400 K/uL  I-stat troponin, ED (not at [facility])     Status: None   Collection Time: 09/23/14  9:34 PM  Result Value Ref Range   Troponin i, poc 0.00 0.00 - 0.08 ng/mL   Comment 3            UA not obtained  Lab Results  Component Value Date   HGBA1C 7.1* 10/08/2013    Estimated Creatinine Clearance: 85.7 mL/min (by C-G formula based on Cr of 0.89).  BNP (last 3 results)  Recent Labs  09/23/14 2130  PROBNP <5.0    Other results:  I have pearsonaly reviewed this: ECG REPORT  Rate: 94  Rhythm: NSR ST&T Change: no ischemic changes   Filed Weights   09/23/14 2121  Weight: 95.255 kg (210 lb)    Cultures:  No results found for: SDES, Crowell, Chaplin, REPTSTATUS   Radiological Exams on Admission: Dg Chest 2 View  09/23/2014   CLINICAL DATA:  Midsternal chest pain, shortness of breast starting today  EXAM: CHEST  2 VIEW  COMPARISON:  None.  FINDINGS: Cardiomediastinal silhouette is unremarkable. No acute infiltrate or pleural effusion. No pulmonary edema. Minimal degenerative changes mid thoracic spine.  IMPRESSION: No active cardiopulmonary disease.   Electronically Signed   By: Lahoma Crocker M.D.   On: 09/23/2014 22:07    Chart has been reviewed  Assessment/Plan  53 yo F with hx of HTN, HL , DM here with chest pain HEART score 5  Present on Admission:  . Chest pain - given  risk factors will admit, monitor on telemetry, cycle cardiac enzymes, obtain serial ECG. Further risk stratify with lipid panel, hgA1C, obtain TSH. Make sure patient is on Aspirin. Further treatment based on the currently pending results.  . Hypothyroidism - continue synthroid DM 2 - SSI, no insulin  Prophylaxis:  Lovenox, Protonix  CODE STATUS:  FULL CODE   Other plan as per orders.  I have spent a total of 55 min on this admission  Jaidan Prevette 09/23/2014, 11:44 PM  [facility]  Pager [phone]   after 2 AM please page floor coverage PA If 7AM-7PM, please contact the day team taking care of the patient  [email or web address]  Password [email or web address]

## 2014-09-23 NOTE — ED Notes (Signed)
Pt is from home, EMS reports pt was studying when she had sudden onset centralized chest pressure with SOB, EMS reports pt was very pale upon initial assessment. Pt took 325 aspirin at home prior to arrival of EMS, EMS adm 2 nitroglycerin, decreased pt's pain down to a 6/10. Pt received 250 cc of normal saline as well. Pt denies n/v, back pain, arm pain, dizziness, diaphoresis.

## 2014-09-23 NOTE — ED Provider Notes (Signed)
Date: 09/23/2014  Rate: 94  Rhythm: normal sinus rhythm  QRS Axis: normal  Intervals: normal  ST/T Wave abnormalities: normal  Conduction Disutrbances:none  Narrative Interpretation:   Old EKG Reviewed: none available    Ezequiel Essex, MD 09/23/14 2157

## 2014-09-23 NOTE — ED Notes (Signed)
MD Rancour at the bedside.   

## 2014-09-24 ENCOUNTER — Telehealth: Payer: Self-pay | Admitting: Physician Assistant

## 2014-09-24 ENCOUNTER — Encounter (HOSPITAL_COMMUNITY): Payer: Self-pay | Admitting: General Practice

## 2014-09-24 DIAGNOSIS — I10 Essential (primary) hypertension: Secondary | ICD-10-CM

## 2014-09-24 DIAGNOSIS — R072 Precordial pain: Secondary | ICD-10-CM

## 2014-09-24 DIAGNOSIS — E785 Hyperlipidemia, unspecified: Secondary | ICD-10-CM

## 2014-09-24 LAB — HEMOGLOBIN A1C
Hgb A1c MFr Bld: 7.4 % — ABNORMAL HIGH (ref <5.7)
Mean Plasma Glucose: 166 mg/dL — ABNORMAL HIGH (ref <117)

## 2014-09-24 LAB — CBC
HCT: 33.7 % — ABNORMAL LOW (ref 36.0–46.0)
Hemoglobin: 11.1 g/dL — ABNORMAL LOW (ref 12.0–15.0)
MCH: 28 pg (ref 26.0–34.0)
MCHC: 32.9 g/dL (ref 30.0–36.0)
MCV: 84.9 fL (ref 78.0–100.0)
Platelets: 191 10*3/uL (ref 150–400)
RBC: 3.97 MIL/uL (ref 3.87–5.11)
RDW: 12.5 % (ref 11.5–15.5)
WBC: 6.2 10*3/uL (ref 4.0–10.5)

## 2014-09-24 LAB — TROPONIN I
Troponin I: <0.3 ng/mL (ref <0.30)
Troponin I: <0.3 ng/mL (ref <0.30)
Troponin I: <0.3 ng/mL (ref <0.30)

## 2014-09-24 LAB — GLUCOSE, CAPILLARY
Glucose-Capillary: 131 mg/dL — ABNORMAL HIGH (ref 70–99)
Glucose-Capillary: 134 mg/dL — ABNORMAL HIGH (ref 70–99)
Glucose-Capillary: 143 mg/dL — ABNORMAL HIGH (ref 70–99)

## 2014-09-24 MED ORDER — AMLODIPINE BESYLATE 2.5 MG PO TABS
2.5000 mg | ORAL_TABLET | Freq: Every day | ORAL | Status: DC
  Administered 2014-09-24: 2.5 mg via ORAL
  Filled 2014-09-24: qty 1

## 2014-09-24 MED ORDER — ASPIRIN EC 81 MG PO TBEC
81.0000 mg | DELAYED_RELEASE_TABLET | Freq: Every day | ORAL | Status: DC
  Administered 2014-09-24: 81 mg via ORAL
  Filled 2014-09-24: qty 1

## 2014-09-24 MED ORDER — ROSUVASTATIN CALCIUM 5 MG PO TABS: 5.0000 mg | ORAL_TABLET | Freq: Every day | ORAL | Status: DC

## 2014-09-24 MED ORDER — AMITRIPTYLINE HCL 10 MG PO TABS
10.0000 mg | ORAL_TABLET | Freq: Every day | ORAL | Status: DC
  Filled 2014-09-24 (×2): qty 1

## 2014-09-24 MED ORDER — AMLODIPINE BESYLATE 2.5 MG PO TABS: 2.5000 mg | ORAL_TABLET | Freq: Every day | ORAL | Status: DC

## 2014-09-24 MED ORDER — INSULIN ASPART 100 UNIT/ML ~~LOC~~ SOLN: 0.0000 [IU] | SUBCUTANEOUS | Status: DC

## 2014-09-24 MED ORDER — LEVOTHYROXINE SODIUM 175 MCG PO TABS
175.0000 ug | ORAL_TABLET | Freq: Every day | ORAL | Status: DC
  Administered 2014-09-24: 175 ug via ORAL
  Filled 2014-09-24 (×2): qty 1

## 2014-09-24 MED ORDER — ACETAMINOPHEN 325 MG PO TABS: 650.0000 mg | ORAL_TABLET | ORAL | Status: DC | PRN

## 2014-09-24 MED ORDER — ENOXAPARIN SODIUM 40 MG/0.4ML ~~LOC~~ SOLN
40.0000 mg | Freq: Every day | SUBCUTANEOUS | Status: DC
  Filled 2014-09-24: qty 0.4

## 2014-09-24 MED ORDER — PANTOPRAZOLE SODIUM 40 MG PO TBEC
40.0000 mg | DELAYED_RELEASE_TABLET | Freq: Every day | ORAL | Status: DC
  Filled 2014-09-24: qty 1

## 2014-09-24 MED ORDER — ONDANSETRON HCL 4 MG/2ML IJ SOLN: 4.0000 mg | Freq: Four times a day (QID) | INTRAMUSCULAR | Status: DC | PRN

## 2014-09-24 NOTE — Consult Note (Addendum)
CARDIOLOGY CONSULT NOTE   Patient ID: MILDA LINDVALL MRN: [id number], DOB/AGE: 1961-09-24   Admit date: 09/23/2014 Date of Consult: 09/24/2014   Primary Physician: [name], PA-C Primary Cardiologist: New   Pt. Profile  An obese 53 year old Caucasian female with past medical history of hypothyroidism, hyperlipidemia, diabetes, GERD s/p gastric sleeve in 2013, and a long-standing history of mitral valve prolapse present with new onset CP  Problem List  Past Medical History  Diagnosis Date  . Thyroid disease   . Hyperlipidemia     Had Gastric Sleeve  . Obesity     Had Gastric Sleeve  . MVP (mitral valve prolapse)     hx. of [>30 yrs]  . Hypothyroidism   . History of kidney stones   . Chicken pox   . Diabetes mellitus     Resolved w/Gastric Sleeve  . GERD (gastroesophageal reflux disease)   . Arthritis     RA  IN KNEE    Past Surgical History  Procedure Laterality Date  . Abdominal hysterectomy    . Tonsillectomy    . Kidney stone surgery    . Knee arthroscopy  02/2012    meniscus repair left 5'13  . Laparoscopic gastric sleeve resection  10/08/2012    Procedure: LAPAROSCOPIC GASTRIC SLEEVE RESECTION;  Surgeon: Madilyn Hook, DO;  Location: [facility] ORS;  Service: General;  Laterality: N/A;  . Cesarean section  1995     Allergies  Allergies  Allergen Reactions  . Codeine Nausea And Vomiting  . Morphine And Related Nausea And Vomiting    HPI   The patient is obese 53 year old Caucasian female with past medical history of hypothyroidism, hyperlipidemia, diabetes, GERD s/p gastric sleeve in 2013, and a long-standing history of mitral valve prolapse. She does have family history of coronary disease, however no family history of early MI before age 16. 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 She states she plays tennis about 4 times a week. The last time she plays tennis was last Sunday. She denies any exertional chest  discomfort or shortness of breath. She denies any prior cardiac workup or coronary artery disease.  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 This is the first time she felt something like this. She described the pain as 10 out of 10 and unlike her previous GERD-like symptoms. She drove to her mothers house 1 mile down the road and took 325 aspirin and an old expired sublingual nitroglycerin before calling 911. During transport, she was given 2 additional sublingual nitroglycerin which completely resolves the pain. In total, the chest discomfort lasted about 45 minutes. There has been no radiation, and she denies any alleviating or exacerbating factors. The chest pain has not recurred since that time. She was admitted by internal medicine group overnight. Serial troponin has been negative. EKG showed normal sinus rhythm without significant ST-T wave changes. Cardiology has been consulted for chest pain.  Inpatient Medications  . amitriptyline  10 mg Oral QHS  . aspirin EC  81 mg Oral Daily  . enoxaparin (LOVENOX) injection  40 mg Subcutaneous Daily  . insulin aspart  0-9 Units Subcutaneous 6 times per day  . levothyroxine  175 mcg Oral QAC breakfast  . pantoprazole  40 mg Oral Daily    Family History Family History  Problem  Relation Age of Onset  . Cancer Other   . Ulcerative colitis Other   . Lung cancer Mother   . Colitis Mother   . Heart disease Mother   . Lung cancer Father   . Heart disease Father   . Alzheimer's disease Maternal Grandmother   . Heart attack Maternal Grandfather   . Heart attack Paternal Grandfather   . Breast cancer Maternal Aunt   . Alzheimer's disease Maternal Aunt   . Hypothyroidism Mother   . Stroke Paternal Aunt   . Healthy Sister     x3  . Healthy Daughter     x3  . Graves' disease Son      Social  History History   Social History  . Marital Status: Married    Spouse Name: N/A    Number of Children: N/A  . Years of Education: N/A   Occupational History  . Not on file.   Social History Main Topics  . Smoking status: Never Smoker   . Smokeless tobacco: Never Used  . Alcohol Use: No     Comment: rare occ.  . Drug Use: No  . Sexual Activity: Yes   Other Topics Concern  . Not on file   Social History Narrative     Review of Systems  General:  No chills, fever, night sweats or weight changes.  Cardiovascular:  No dyspnea on exertion, edema, orthopnea, palpitations, paroxysmal nocturnal dyspnea. +chest pain Dermatological: No rash, lesions/masses Respiratory: No cough, dyspnea Urologic: No hematuria, dysuria Abdominal:   No nausea, vomiting, diarrhea, bright red blood per rectum, melena, or hematemesis Neurologic:  No visual changes, wkns, changes in mental status. All other systems reviewed and are otherwise negative except as noted above.  Physical Exam  Blood pressure 123/74, pulse 81, temperature 97.8 F (36.6 C), temperature source Oral, resp. rate 18, height 5\' 7"  (1.702 m), weight 214 lb 14.4 oz (97.478 kg), SpO2 98 %.  General: Pleasant, NAD Psych: Normal affect. Neuro: Alert and oriented X 3. Moves all extremities spontaneously. HEENT: Normal  Neck: Supple without bruits or JVD. Lungs:  Resp regular and unlabored, CTA. Heart: RRR no s3, s4, or murmurs. Abdomen: Soft, non-tender, non-distended, BS + x 4.  Extremities: No clubbing, cyanosis or edema. DP/PT/Radials 2+ and equal bilaterally.  Labs   Recent Labs  09/24/14 0208 09/24/14 0535 09/24/14 0736  TROPONINI <0.30 <0.30 <0.30   Lab Results  Component Value Date   WBC 6.2 09/24/2014   HGB 11.1* 09/24/2014   HCT 33.7* 09/24/2014   MCV 84.9 09/24/2014   PLT 191 09/24/2014    Recent Labs Lab 09/23/14 2130  NA 137  K 3.9  CL 99  CO2 24  BUN 11  CREATININE 0.89  CALCIUM 9.3  PROT 6.7    BILITOT 0.5  ALKPHOS 92  ALT 30  AST 62*  GLUCOSE 232*   Lab Results  Component Value Date   CHOL 216* 10/08/2013   HDL 60 10/08/2013   LDLCALC 138* 10/08/2013   TRIG 91 10/08/2013   No results found for: DDIMER  Radiology/Studies  Dg Chest 2 View  09/23/2014   CLINICAL DATA:  Midsternal chest pain, shortness of breast starting today  EXAM: CHEST  2 VIEW  COMPARISON:  None.  FINDINGS: Cardiomediastinal silhouette is unremarkable. No acute infiltrate or pleural effusion. No pulmonary edema. Minimal degenerative changes mid thoracic spine.  IMPRESSION: No active cardiopulmonary disease.   Electronically Signed   By: Lahoma Crocker M.D.   On:  09/23/2014 22:07    ECG: NSR without any ST or T wave changes  ECHO:09/24/2014 - Left ventricle: The cavity size was normal. There was mild concentric hypertrophy. Systolic function was normal. The estimated ejection fraction was in the range of 60% to 65%. Wall motion was normal; there were no regional wall motion abnormalities. Doppler parameters are consistent with abnormal left ventricular relaxation (grade 1 diastolic dysfunction). There was no evidence of elevated ventricular filling pressure by Doppler parameters. - Aortic valve: Trileaflet; normal thickness leaflets. - Mitral valve: Structurally normal valve. - Left atrium: The atrium was normal in size. - Right ventricle: Systolic function was normal. - Right atrium: The atrium was normal in size. - Tricuspid valve: There was mild regurgitation. - Pulmonic valve: There was no regurgitation. - Pulmonary arteries: Systolic pressure was within the normal range. - Inferior vena cava: The vessel was normal in size. - Pericardium, extracardiac: There was no pericardial effusion.  Impressions:  - Normal biventricular size and systolic function. Abnormal relaxation with normal filling pressures. Mild tricuspid regurgitation. Normal RVSP.     ASSESSMENT AND  PLAN  1. Chest pain:  - negative serial trop, no EKG changes. Has been fairly active recently without CP  - cardiac risk factor include HLD, DM and obesity. FHx of CAD however no early MI before age 60  - plan for exercise tolerance test today, if no EKG, can be discharged  2. Hyperlipidemia 3. DM  4. GERD s/p gastric sleeve in 2013 5. hypothyroidism  6. long-standing history of mitral valve prolapse  Signed, Almyra Deforest, PA-C 09/24/2014, 12:20 PM     The patient was seen, examined and discussed with Almyra Deforest, PA-C and I agree with the above.   A 53 year old with atypical chest pain, she is very active and asymptomatic during exertion. Troponins negative x 3, ECG is normal. I reviewed her echo, that shows normal LVEF 60-65%, mild LVH with abnormal relaxation. No evidence of MVP or mitral regurgitation. We will perform a treadmill exercise stress test, if negative she can be discharged home. Considering her age, DM, hyperlipidemia she should be started on moderate to high dose of highly efficient statins. Also, considering her LVH if hypertensive response to stress, we will add antihypertensive agent. We will follow in the clinic.    Dorothy Spark 09/24/2014

## 2014-09-24 NOTE — Progress Notes (Signed)
  PROGRESS NOTE  Laurie Krueger DGL:[id number] DOB: 11/17/1960 DOA: 09/23/2014 PCP: [name], PA-C  Assessment/Plan: Chest pain -  -telemetry -cardiac enzymes negative -lipid panel, hgA1C, TSH.  -Aspirin.  -echo -cards consult for possible stress test  Hypothyroidism - continue synthroid  DM 2 - SSI, no insulin  Code Status: full Family Communication: patient Disposition Plan:    Consultants:  cards  Procedures:      HPI/Subjective: Chest pain resolved  Objective: Filed Vitals:   09/24/14 0414  BP: 123/74  Pulse: 81  Temp: 97.8 F (36.6 C)  Resp: 18   No intake or output data in the 24 hours ending 09/24/14 0830 Filed Weights   09/23/14 2121 09/24/14 0054  Weight: 95.255 kg (210 lb) 97.478 kg (214 lb 14.4 oz)    Exam:   General:  A+Ox3, NAD  Cardiovascular: tele: NSR  Respiratory: clear, no wheezing  Abdomen: +BS, soft  Musculoskeletal: no edema  Data Reviewed: Basic Metabolic Panel:  Recent Labs Lab 09/23/14 2130  NA 137  K 3.9  CL 99  CO2 24  GLUCOSE 232*  BUN 11  CREATININE 0.89  CALCIUM 9.3   Liver Function Tests:  Recent Labs Lab 09/23/14 2130  AST 62*  ALT 30  ALKPHOS 92  BILITOT 0.5  PROT 6.7  ALBUMIN 3.5   No results for input(s): LIPASE, AMYLASE in the last 168 hours. No results for input(s): AMMONIA in the last 168 hours. CBC:  Recent Labs Lab 09/23/14 2130 09/24/14 0208  WBC 7.4 6.2  HGB 12.0 11.1*  HCT 35.2* 33.7*  MCV 86.7 84.9  PLT 197 191   Cardiac Enzymes:  Recent Labs Lab 09/24/14 0208 09/24/14 0535  TROPONINI <0.30 <0.30   BNP (last 3 results)  Recent Labs  09/23/14 2130  PROBNP <5.0   CBG:  Recent Labs Lab 09/24/14 0413 09/24/14 0812  GLUCAP 131* 134*    No results found for this or any previous visit (from the past 240 hour(s)).   Studies: Dg Chest 2 View  09/23/2014   CLINICAL DATA:  Midsternal chest pain, shortness of breast starting today  EXAM: CHEST  2  VIEW  COMPARISON:  None.  FINDINGS: Cardiomediastinal silhouette is unremarkable. No acute infiltrate or pleural effusion. No pulmonary edema. Minimal degenerative changes mid thoracic spine.  IMPRESSION: No active cardiopulmonary disease.   Electronically Signed   By: Lahoma Crocker M.D.   On: 09/23/2014 22:07    Scheduled Meds: . amitriptyline  10 mg Oral QHS  . enoxaparin (LOVENOX) injection  40 mg Subcutaneous Daily  . insulin aspart  0-9 Units Subcutaneous 6 times per day  . levothyroxine  175 mcg Oral QAC breakfast  . pantoprazole  40 mg Oral Daily   Continuous Infusions:  Antibiotics Given (last 72 hours)    None      Active Problems:   Hypothyroidism   Chest pain   Diabetes mellitus    Time spent: 25 min    Seamus Warehime  [facility] Pager [phone]. If 7PM-7AM, please contact night-coverage at [email or web address], password [email or web address] 09/24/2014, 8:30 AM  LOS: 1 day

## 2014-09-24 NOTE — Progress Notes (Signed)
Discussed with Dr. Marlou Porch who overread the Exercise Tolerance test. Overall, low risk study. Ok to discharge from cardiology perspective. She will need better control of her BP, we have started on 2.5mg  amlodipine. She will continue statin. I will sent staff email to our office to call her to schedule followup.  Echo result reviewed, EF preserved, no wall motion abnormality. LVH consistent with HTN. Overall reassuring.  Hilbert Corrigan PA Pager: [phone]

## 2014-09-24 NOTE — Progress Notes (Signed)
Discharge instructions given. Pt verbalized understanding and all questions were answered.  

## 2014-09-24 NOTE — Discharge Summary (Signed)
Physician Discharge Summary  Laurie Krueger:[id number] DOB: [birth year] DOA: 09/23/2014  PCP: [name], PA-C  Admit date: 09/23/2014 Discharge date: 09/24/2014  Time spent: 35 minutes  Recommendations for Outpatient Follow-up:  1. 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 He drove to her mother's house to get aspirin. It did not improve with aspirin but improved with Nitro x2. She was seen at [facility] ER trop and ECG unremarkable. BG Was elevated 232. Currently chest pain free.  Pain did not radiate. She does not endorse extra coffee. She has occasional indigestion after having Gastric sleeve procedure but this was different. Never had any stress test. Both parents had CAD/MI in late 42 and for mother 28  Hospitalist was called for admission for chest pain  Hospital Course:  Chest pain- stress test low risk, cE neg, statin and norvasc  Procedures:  Stress test (exercise)  Consultations:  cards  Discharge Exam: Filed Vitals:   09/24/14 0414  BP: 123/74  Pulse: 81  Temp: 97.8 F (36.6 C)  Resp: 18      Discharge Instructions You were cared for by a hospitalist during your hospital stay. If you have any questions about your discharge medications or the care you received while you were in the hospital after you are discharged, you can call the unit and asked to speak with the hospitalist on call if the hospitalist that took care of you is not available. Once you are discharged, your primary care  physician will handle any further medical issues. Please note that NO REFILLS for any discharge medications will be authorized once you are discharged, as it is imperative that you return to your primary care physician (or establish a relationship with a primary care physician if you do not have one) for your aftercare needs so that they can reassess your need for medications and monitor your lab values.  Discharge Instructions    Diet - low sodium heart healthy    Complete by:  As directed      Diet Carb Modified    Complete by:  As directed      Increase activity slowly    Complete by:  As directed           Current Discharge Medication List    START taking these medications   Details  amLODipine (NORVASC) 2.5 MG tablet Take 1 tablet (2.5 mg total) by mouth daily. Qty: 30 tablet, Refills: 0      CONTINUE these medications which have CHANGED   Details  rosuvastatin (CRESTOR) 5 MG tablet Take 1 tablet (5 mg total) by mouth daily. Qty: 90 tablet, Refills: 3   Associated Diagnoses: Hyperlipidemia with target LDL less than 100      CONTINUE these medications which have NOT CHANGED   Details  amitriptyline (ELAVIL) 10 MG tablet Take 10 mg by mouth at bedtime.    BYDUREON 2 MG SUSR Inject 2 mg into the skin once a week. On Sunday Refills: 12    Calcium Carbonate-Vitamin D (CALCIUM-VITAMIN D3  PO) Take 1 each by mouth every other day.    celecoxib (CELEBREX) 100 MG capsule Take 100 mg by mouth 2 (two) times daily as needed. Refills: 12    levothyroxine (SYNTHROID) 175 MCG tablet Take 175 mcg by mouth daily before breakfast. Brand ONLY    pantoprazole (PROTONIX) 40 MG tablet Take 1 tablet (40 mg total) by mouth daily. Qty: 30 tablet, Refills: 3      STOP taking these medications     Cyanocobalamin 1000 MCG/ML KIT      metFORMIN (GLUCOPHAGE) 500 MG tablet      ondansetron (ZOFRAN ODT) 4 MG disintegrating tablet      ondansetron (ZOFRAN) 8 MG tablet        Allergies   Allergen Reactions  . Codeine Nausea And Vomiting  . Morphine And Related Nausea And Vomiting   Follow-up Information    Follow up with [name], PA-C In 1 week.   Specialty:  Physician Assistant   Contact information:   [address] [place] [address] [phone]       Please follow up.   Why:  cardiology will call with appointment       The results of significant diagnostics from this hospitalization (including imaging, microbiology, ancillary and laboratory) are listed below for reference.    Significant Diagnostic Studies: Dg Chest 2 View  09/23/2014   CLINICAL DATA:  Midsternal chest pain, shortness of breast starting today  EXAM: CHEST  2 VIEW  COMPARISON:  None.  FINDINGS: Cardiomediastinal silhouette is unremarkable. No acute infiltrate or pleural effusion. No pulmonary edema. Minimal degenerative changes mid thoracic spine.  IMPRESSION: No active cardiopulmonary disease.   Electronically Signed   By: Lahoma Crocker M.D.   On: 09/23/2014 22:07    Microbiology: No results found for this or any previous visit (from the past 240 hour(s)).   Labs: Basic Metabolic Panel:  Recent Labs Lab 09/23/14 2130  NA 137  K 3.9  CL 99  CO2 24  GLUCOSE 232*  BUN 11  CREATININE 0.89  CALCIUM 9.3   Liver Function Tests:  Recent Labs Lab 09/23/14 2130  AST 62*  ALT 30  ALKPHOS 92  BILITOT 0.5  PROT 6.7  ALBUMIN 3.5   No results for input(s): LIPASE, AMYLASE in the last 168 hours. No results for input(s): AMMONIA in the last 168 hours. CBC:  Recent Labs Lab 09/23/14 2130 09/24/14 0208  WBC 7.4 6.2  HGB 12.0 11.1*  HCT 35.2* 33.7*  MCV 86.7 84.9  PLT 197 191   Cardiac Enzymes:  Recent Labs Lab 09/24/14 0208 09/24/14 0535 09/24/14 0736  TROPONINI <0.30 <0.30 <0.30   BNP: BNP (last 3 results)  Recent Labs  09/23/14 2130  PROBNP <5.0   CBG:  Recent Labs Lab 09/24/14 0413 09/24/14 0812 09/24/14 1128  GLUCAP 131*  134* 143*       Signed:  Kharter Brew  [facility] 09/24/2014, 4:59 PM

## 2014-09-24 NOTE — Telephone Encounter (Signed)
Error/gd °

## 2014-09-24 NOTE — Progress Notes (Signed)
DC IV and tele per MD orders and protocol; DC instructions reviewed with patient and family at bedside; no further questions from patient or family; patient will have a follow up appointment with cardiology; paper prescriptions given to patient.  Rowe Pavy, RN

## 2014-09-24 NOTE — Progress Notes (Signed)
UR completed 

## 2014-09-24 NOTE — Progress Notes (Signed)
  Echocardiogram 2D Echocardiogram has been performed.  Laurie Krueger 09/24/2014, 12:41 PM

## 2014-09-28 ENCOUNTER — Telehealth: Payer: Self-pay

## 2014-09-28 NOTE — Telephone Encounter (Signed)
Hospital follow up appointment scheduled on 10/05/14 at 4 pm.  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   Therefore appointment requested an afternoon appointment.    Recommendations for Outpatient Follow-up:  1. Continue statin and norvasc and other risk factor modifications  Reason for admission:  Chest pressure  Cardiac work up was negative.  Pt denies chest pain/pressure at the time of call.  Pt states that she has been better since discharge.     Allergies:reviewed Medication: pt stated that she would bring them with her during her visit

## 2014-10-05 ENCOUNTER — Ambulatory Visit (INDEPENDENT_AMBULATORY_CARE_PROVIDER_SITE_OTHER): Payer: 59 | Admitting: Physician Assistant

## 2014-10-05 ENCOUNTER — Encounter: Payer: Self-pay | Admitting: Physician Assistant

## 2014-10-05 VITALS — BP 127/70 | HR 72 | Temp 98.2°F | Resp 16 | Ht 67.0 in | Wt 215.1 lb

## 2014-10-05 DIAGNOSIS — R079 Chest pain, unspecified: Secondary | ICD-10-CM

## 2014-10-05 DIAGNOSIS — E119 Type 2 diabetes mellitus without complications: Secondary | ICD-10-CM

## 2014-10-05 DIAGNOSIS — E785 Hyperlipidemia, unspecified: Secondary | ICD-10-CM

## 2014-10-05 DIAGNOSIS — R748 Abnormal levels of other serum enzymes: Secondary | ICD-10-CM

## 2014-10-05 MED ORDER — ICOSAPENT ETHYL 1 G PO CAPS: ORAL_CAPSULE | ORAL | Status: DC

## 2014-10-05 NOTE — Progress Notes (Signed)
Pre visit review using our clinic review tool, if applicable. No additional management support is needed unless otherwise documented below in the visit note/SLS  

## 2014-10-05 NOTE — Patient Instructions (Signed)
Please continue medications as directed with the following changes -- Take Vascepa 1 tablet once daily. Then take Metformin 500 mg twice daily.  Continue all other medications as directed.  Follow-up with Cardiology as scheduled.     Stop by the lab for blood work.  I will call you with your results. Follow-up will be based on results.  Will recheck A1C in 3 months.

## 2014-10-06 LAB — COMPREHENSIVE METABOLIC PANEL
ALBUMIN: 4 g/dL (ref 3.5–5.2)
ALT: 13 U/L (ref 0–35)
AST: 17 U/L (ref 0–37)
Alkaline Phosphatase: 64 U/L (ref 39–117)
BUN: 11 mg/dL (ref 6–23)
CALCIUM: 9.2 mg/dL (ref 8.4–10.5)
CO2: 24 meq/L (ref 19–32)
Chloride: 110 mEq/L (ref 96–112)
Creatinine, Ser: 0.8 mg/dL (ref 0.4–1.2)
GFR: 75.9 mL/min (ref >60.00)
GLUCOSE: 165 mg/dL — AB (ref 70–99)
Potassium: 3.8 mEq/L (ref 3.5–5.1)
SODIUM: 139 meq/L (ref 135–145)
TOTAL PROTEIN: 6.7 g/dL (ref 6.0–8.3)
Total Bilirubin: 0.5 mg/dL (ref 0.2–1.2)

## 2014-10-06 LAB — MICROALBUMIN / CREATININE URINE RATIO
Creatinine,U: 149.8 mg/dL
MICROALB UR: 0.6 mg/dL (ref 0.0–1.9)
Microalb Creat Ratio: 0.4 mg/g (ref 0.0–30.0)

## 2014-10-07 DIAGNOSIS — E785 Hyperlipidemia, unspecified: Secondary | ICD-10-CM

## 2014-10-07 DIAGNOSIS — E1169 Type 2 diabetes mellitus with other specified complication: Secondary | ICD-10-CM | POA: Insufficient documentation

## 2014-10-07 NOTE — Progress Notes (Signed)
Patient presents to clinic today for hospital follow-up. Patient was seen in the ER and subsequently admitted to the hospital on 09/23/2014 with complaints of chest pain. See hospital history and physical and discharge summary for specific findings. A summary of hospital course is as follows: Ear workup including EKG and troponins were unremarkable. Patient admitted to hospital for further evaluation. Stress test was performed with no evidence of ischemia. Patient was stabilized and discharged on amlodipine and statin therapy. Patient given strict return precautions to follow-up with her PCP and with cardiology. Has upcoming appointment to follow-up with cardiology.  Since discharge, patient states she has been pain-free. Is taking medications as directed. States she feels fine and is unsure what her symptoms were related to. Denies heartburn, chronic cough, abdominal pain. Denies stress or anxiety. Denies palpitations or shortness of breath.  Past Medical History  Diagnosis Date  . Thyroid disease   . Hyperlipidemia     Had Gastric Sleeve  . Obesity     Had Gastric Sleeve  . MVP (mitral valve prolapse)     hx. of [>30 yrs]  . Hypothyroidism   . History of kidney stones   . Chicken pox   . Diabetes mellitus     Resolved w/Gastric Sleeve  . GERD (gastroesophageal reflux disease)   . 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 amitriptyline (ELAVIL) 10 MG tablet Take 10 mg by mouth at bedtime.    Marland Kitchen amLODipine (NORVASC) 2.5 MG tablet Take 1 tablet (2.5 mg total) by mouth daily. 30 tablet 0  . BYDUREON 2 MG SUSR Inject 2 mg into the skin once a week. On Sunday  12  . Calcium Carbonate-Vitamin D (CALCIUM-VITAMIN D3 PO) Take 1 each by mouth every other day.    . celecoxib (CELEBREX) 100 MG capsule Take 100 mg by mouth 2 (two) times daily as needed.  12  . levothyroxine (SYNTHROID) 175 MCG tablet Take 175 mcg by  mouth daily before breakfast. Brand ONLY    . pantoprazole (PROTONIX) 40 MG tablet Take 1 tablet (40 mg total) by mouth daily. 30 tablet 3  . rosuvastatin (CRESTOR) 5 MG tablet Take 1 tablet (5 mg total) by mouth daily. 90 tablet 3   No current facility-administered medications on file prior to visit.    Allergies  Allergen Reactions  . Codeine Nausea And Vomiting  . Morphine And Related Nausea And Vomiting    Family History  Problem Relation Age of Onset  . Cancer Other   . Ulcerative colitis Other   . Lung cancer Mother   . Colitis Mother   . Heart disease Mother   . Lung cancer Father   . Heart disease Father   . Alzheimer's disease Maternal Grandmother   . Heart attack Maternal Grandfather   . Heart attack Paternal Grandfather   . Breast cancer Maternal Aunt   . Alzheimer's disease Maternal Aunt   . Hypothyroidism Mother   . Stroke Paternal Aunt   . Healthy Sister     x3  . Healthy Daughter     x3  . Graves' disease Son     History   Social History  . Marital Status: Married    Spouse Name: N/A    Number of Children: N/A  . Years of Education: N/A   Social History Main Topics  . Smoking status: Never Smoker   .  Smokeless tobacco: Never Used  . Alcohol Use: No     Comment: rare occ.  . Drug Use: No  . Sexual Activity: Yes   Other Topics Concern  . None   Social History Narrative   Review of Systems - See HPI.  All other ROS are negative.  BP 127/70 mmHg  Pulse 72  Temp(Src) 98.2 F (36.8 C) (Oral)  Resp 16  Ht _0  (1.702 m)  Wt 215 lb 2 oz (97.58 kg)  BMI 33.69 kg/m2  SpO2 100%  Physical Exam  Constitutional: She is oriented to person, place, and time and well-developed, well-nourished, and in no distress.  HENT:  Head: Normocephalic and atraumatic.  Eyes: Conjunctivae are normal.  Neck: Neck supple.  Cardiovascular: Normal rate, regular rhythm, normal heart sounds and intact distal pulses.   Pulmonary/Chest: Effort normal and breath  sounds normal. No respiratory distress. She has no wheezes. She has no rales. She exhibits no tenderness.  Neurological: She is alert and oriented to person, place, and time.  Skin: Skin is warm and dry. No rash noted.  Psychiatric: Affect normal.  Vitals reviewed.  Recent Results (from the past 2160 hour(s))  Pro b natriuretic peptide (order if patient c/o SOB ONLY)     Status: None   Collection Time: 09/23/14  9:30 PM  Result Value Ref Range   Pro B Natriuretic peptide (BNP) <5.0 0 - 125 pg/mL  Comprehensive metabolic panel     Status: Abnormal   Collection Time: 09/23/14  9:30 PM  Result Value Ref Range   Sodium 137 137 - 147 mEq/L   Potassium 3.9 3.7 - 5.3 mEq/L   Chloride 99 96 - 112 mEq/L   CO2 24 19 - 32 mEq/L   Glucose, Bld 232 (H) 70 - 99 mg/dL   BUN 11 6 - 23 mg/dL   Creatinine, Ser 0.89 0.50 - 1.10 mg/dL   Calcium 9.3 8.4 - 10.5 mg/dL   Total Protein 6.7 6.0 - 8.3 g/dL   Albumin 3.5 3.5 - 5.2 g/dL   AST 62 (H) 0 - 37 U/L   ALT 30 0 - 35 U/L   Alkaline Phosphatase 92 39 - 117 U/L   Total Bilirubin 0.5 0.3 - 1.2 mg/dL   GFR calc non Af Amer 72 (L) >90 mL/min   GFR calc Af Amer 84 (L) >90 mL/min    Comment: (NOTE) The eGFR has been calculated using the CKD EPI equation. This calculation has not been validated in all clinical situations. eGFR's persistently <90 mL/min signify possible Chronic Kidney Disease.    Anion gap 14 5 - 15  CBC     Status: Abnormal   Collection Time: 09/23/14  9:30 PM  Result Value Ref Range   WBC 7.4 4.0 - 10.5 K/uL   RBC 4.06 3.87 - 5.11 MIL/uL   Hemoglobin 12.0 12.0 - 15.0 g/dL   HCT 35.2 (L) 36.0 - 46.0 %   MCV 86.7 78.0 - 100.0 fL   MCH 29.6 26.0 - 34.0 pg   MCHC 34.1 30.0 - 36.0 g/dL   RDW 12.5 11.5 - 15.5 %   Platelets 197 150 - 400 K/uL  I-stat troponin, ED (not at [facility])     Status: None   Collection Time: 09/23/14  9:34 PM  Result Value Ref Range   Troponin i, poc 0.00 0.00 - 0.08 ng/mL   Comment 3            Comment: Due  to the  release kinetics of cTnI, a negative result within the first hours of the onset of symptoms does not rule out myocardial infarction with certainty. If myocardial infarction is still suspected, repeat the test at appropriate intervals.   Hemoglobin A1c     Status: Abnormal   Collection Time: 09/24/14  2:08 AM  Result Value Ref Range   Hgb A1c MFr Bld 7.4 (H) <5.7 %    Comment: (NOTE)                                                                       According to the ADA Clinical Practice Recommendations for 2011, when HbA1c is used as a screening test:  >=6.5%   Diagnostic of Diabetes Mellitus           (if abnormal result is confirmed) 5.7-6.4%   Increased risk of developing Diabetes Mellitus References:Diagnosis and Classification of Diabetes Mellitus,Diabetes BOFB,5102,58(NIDPO 1):S62-S69 and Standards of Medical Care in         Diabetes - 2011,Diabetes EUMP,5361,44 (Suppl 1):S11-S61.    Mean Plasma Glucose 166 (H) <117 mg/dL    Comment: Performed at [facility]  CBC     Status: Abnormal   Collection Time: 09/24/14  2:08 AM  Result Value Ref Range   WBC 6.2 4.0 - 10.5 K/uL   RBC 3.97 3.87 - 5.11 MIL/uL   Hemoglobin 11.1 (L) 12.0 - 15.0 g/dL   HCT 33.7 (L) 36.0 - 46.0 %   MCV 84.9 78.0 - 100.0 fL   MCH 28.0 26.0 - 34.0 pg   MCHC 32.9 30.0 - 36.0 g/dL   RDW 12.5 11.5 - 15.5 %   Platelets 191 150 - 400 K/uL  Troponin I-serum (0, 3, 6 hours)     Status: None   Collection Time: 09/24/14  2:08 AM  Result Value Ref Range   Troponin I <0.30 <0.30 ng/mL    Comment:        Due to the release kinetics of cTnI, a negative result within the first hours of the onset of symptoms does not rule out myocardial infarction with certainty. If myocardial infarction is still suspected, repeat the test at appropriate intervals.   Glucose, capillary     Status: Abnormal   Collection Time: 09/24/14  4:13 AM  Result Value Ref Range   Glucose-Capillary 131 (H) 70 - 99  mg/dL  Troponin I-serum (0, 3, 6 hours)     Status: None   Collection Time: 09/24/14  5:35 AM  Result Value Ref Range   Troponin I <0.30 <0.30 ng/mL    Comment:        Due to the release kinetics of cTnI, a negative result within the first hours of the onset of symptoms does not rule out myocardial infarction with certainty. If myocardial infarction is still suspected, repeat the test at appropriate intervals.   Troponin I-serum (0, 3, 6 hours)     Status: None   Collection Time: 09/24/14  7:36 AM  Result Value Ref Range   Troponin I <0.30 <0.30 ng/mL    Comment:        Due to the release kinetics of cTnI, a negative result within the first hours of the onset of symptoms does not  rule out myocardial infarction with certainty. If myocardial infarction is still suspected, repeat the test at appropriate intervals.   Glucose, capillary     Status: Abnormal   Collection Time: 09/24/14  8:12 AM  Result Value Ref Range   Glucose-Capillary 134 (H) 70 - 99 mg/dL   Comment 1 Notify RN   Glucose, capillary     Status: Abnormal   Collection Time: 09/24/14 11:28 AM  Result Value Ref Range   Glucose-Capillary 143 (H) 70 - 99 mg/dL   Comment 1 Notify RN   Comp Met (CMET)     Status: Abnormal   Collection Time: 10/05/14  4:57 PM  Result Value Ref Range   Sodium 139 135 - 145 mEq/L   Potassium 3.8 3.5 - 5.1 mEq/L   Chloride 110 96 - 112 mEq/L   CO2 24 19 - 32 mEq/L   Glucose, Bld 165 (H) 70 - 99 mg/dL   BUN 11 6 - 23 mg/dL   Creatinine, Ser 0.8 0.4 - 1.2 mg/dL   Total Bilirubin 0.5 0.2 - 1.2 mg/dL   Alkaline Phosphatase 64 39 - 117 U/L   AST 17 0 - 37 U/L   ALT 13 0 - 35 U/L   Total Protein 6.7 6.0 - 8.3 g/dL   Albumin 4.0 3.5 - 5.2 g/dL   Calcium 9.2 8.4 - 10.5 mg/dL   GFR 75.90 >60.00 mL/min  Urine Microalbumin w/creat. ratio     Status: None   Collection Time: 10/05/14  4:57 PM  Result Value Ref Range   Microalb, Ur 0.6 0.0 - 1.9 mg/dL   Creatinine,U 149.8 mg/dL   Microalb  Creat Ratio 0.4 0.0 - 30.0 mg/g    Assessment/Plan: Hyperlipidemia LDL goal <100 Will check NMR lipid profile.  Will also recheck liver function.  Chest pain Unclear etiology.  Cardiac workup unremarkable. No recurrence.  Continue current regimen.  Follow-up with Cardiology as scheduled.

## 2014-10-07 NOTE — Assessment & Plan Note (Signed)
Will check NMR lipid profile.  Will also recheck liver function.

## 2014-10-07 NOTE — Assessment & Plan Note (Signed)
Unclear etiology.  Cardiac workup unremarkable. No recurrence.  Continue current regimen.  Follow-up with Cardiology as scheduled.

## 2014-10-23 DIAGNOSIS — R079 Chest pain, unspecified: Secondary | ICD-10-CM

## 2014-10-28 ENCOUNTER — Other Ambulatory Visit: Payer: Self-pay | Admitting: *Deleted

## 2014-10-28 MED ORDER — AMLODIPINE BESYLATE 2.5 MG PO TABS: 2.5000 mg | ORAL_TABLET | Freq: Every day | ORAL | Status: DC

## 2014-10-28 NOTE — Telephone Encounter (Signed)
Rx request to pharmacy/SLS  

## 2014-10-30 ENCOUNTER — Other Ambulatory Visit: Payer: Self-pay | Admitting: *Deleted

## 2014-10-30 MED ORDER — METFORMIN HCL 500 MG PO TABS: 500.0000 mg | ORAL_TABLET | Freq: Two times a day (BID) | ORAL | Status: DC

## 2014-10-30 NOTE — Telephone Encounter (Signed)
Rx request to pharmacy/SLS  

## 2014-11-18 ENCOUNTER — Ambulatory Visit: Payer: 59 | Admitting: Cardiology

## 2014-11-24 ENCOUNTER — Encounter: Payer: Self-pay | Admitting: Cardiology

## 2014-11-24 ENCOUNTER — Ambulatory Visit (INDEPENDENT_AMBULATORY_CARE_PROVIDER_SITE_OTHER): Payer: 59 | Admitting: Cardiology

## 2014-11-24 VITALS — BP 120/64 | HR 86 | Ht 67.0 in | Wt 215.0 lb

## 2014-11-24 DIAGNOSIS — I10 Essential (primary) hypertension: Secondary | ICD-10-CM

## 2014-11-24 DIAGNOSIS — E785 Hyperlipidemia, unspecified: Secondary | ICD-10-CM

## 2014-11-24 DIAGNOSIS — I517 Cardiomegaly: Secondary | ICD-10-CM

## 2014-11-24 DIAGNOSIS — R079 Chest pain, unspecified: Secondary | ICD-10-CM

## 2014-11-24 LAB — COMPREHENSIVE METABOLIC PANEL
ALT: 14 U/L (ref 0–35)
AST: 13 U/L (ref 0–37)
Albumin: 4 g/dL (ref 3.5–5.2)
Alkaline Phosphatase: 62 U/L (ref 39–117)
BUN: 12 mg/dL (ref 6–23)
CO2: 30 mEq/L (ref 19–32)
Calcium: 9.4 mg/dL (ref 8.4–10.5)
Chloride: 103 mEq/L (ref 96–112)
Creatinine, Ser: 0.68 mg/dL (ref 0.40–1.20)
GFR: 95.48 mL/min (ref >60.00)
Glucose, Bld: 227 mg/dL — ABNORMAL HIGH (ref 70–99)
Potassium: 3.8 mEq/L (ref 3.5–5.1)
Sodium: 137 mEq/L (ref 135–145)
Total Bilirubin: 0.4 mg/dL (ref 0.2–1.2)
Total Protein: 6.5 g/dL (ref 6.0–8.3)

## 2014-11-24 NOTE — Progress Notes (Signed)
Patient ID: Laurie Krueger, female   DOB: 02-17-1961, 54 y.o.   MRN: [id number]     Cardiology Office Note   Date:  11/24/2014   ID:  Laurie Krueger, DOB 1960-10-31, MRN [id number]  PCP:  [name], PA-C  Cardiologist:   Dorothy Spark, MD   No chief complaint on file.     History of Present Illness: A 54 year old with atypical chest pain, she is very active and asymptomatic during exertion. Troponins negative x 3, ECG is normal. I reviewed her echo, that shows normal LVEF 60-65%, mild LVH with abnormal relaxation. No evidence of MVP or mitral regurgitation. We will perform a treadmill exercise stress test, if negative she can be discharged home. Considering her age, DM, hyperlipidemia she should be started on moderate to high dose of highly efficient statins. Also, considering her LVH if hypertensive response to stress, we will add antihypertensive agent.  ETT: 09/24/2014 1. Resting: BP 148/78 HR 71 Asymptomatic. NSR w/o ST-T wave changes 2. Stress: BP 188/77 HR 144. SBP eventually went >200. Some SOB, no CP through entire test. Reached target heart rate at 5 min, was able to maintain > target heart rate until 7:30 min. Mild J point depression in inferior lead, however only 0.89mm at maximum heart rate 3. Resting: HR 160/92 BP 89. SOB improved, never had any CP. Mild J point depression resolved quickly aftr resting. EKG returned to resting level. no ST-T wave changes Agree with above. Low risk ETT. Candee Furbish, MD Confirmed by Marlou Porch MD, [place] ([id number]) on 09/24/2014 4:51:37 PM  She was started on amlodipine and discharge home. No more chest pain since then.    Past Medical History  Diagnosis Date  . Thyroid disease   . Hyperlipidemia     Had Gastric Sleeve  . Obesity     Had Gastric Sleeve  . MVP (mitral valve prolapse)     hx. of [>30 yrs]  . Hypothyroidism   . History of kidney stones   . Chicken pox   . Diabetes mellitus     Resolved w/Gastric Sleeve  . GERD  (gastroesophageal reflux disease)   . Arthritis     RA  IN KNEE    Past Surgical History  Procedure Laterality Date  . Abdominal hysterectomy    . Tonsillectomy    . Kidney stone surgery    . Knee arthroscopy  02/2012    meniscus repair left 5'13  . Laparoscopic gastric sleeve resection  10/08/2012    Procedure: LAPAROSCOPIC GASTRIC SLEEVE RESECTION;  Surgeon: Madilyn Hook, DO;  Location: [facility];  Service: General;  Laterality: N/A;  . Cesarean section  1995   He is on the radiocarpal All all is going on his own Current Outpatient Prescriptions  Medication Sig Dispense Refill  . amitriptyline (ELAVIL) 10 MG tablet Take 10 mg by mouth at bedtime.    Marland Kitchen amLODipine (NORVASC) 2.5 MG tablet Take 1 tablet (2.5 mg total) by mouth daily. 30 tablet 2  . aspirin 81 MG tablet Take 81 mg by mouth daily.    Marland Kitchen BYDUREON 2 MG SUSR Inject 2 mg into the skin once a week. On Sunday  12  . Calcium Carbonate-Vitamin D (CALCIUM-VITAMIN D3 PO) Take 1 each by mouth every other day.    . celecoxib (CELEBREX) 100 MG capsule Take 100 mg by mouth 2 (two) times daily as needed.  12  . Icosapent Ethyl (VASCEPA) 1 G CAPS Take 1 tablet by mouth  daily. 120 capsule 0  . levothyroxine (SYNTHROID) 175 MCG tablet Take 175 mcg by mouth daily before breakfast. Brand ONLY    . metFORMIN (GLUCOPHAGE) 500 MG tablet Take 1 tablet (500 mg total) by mouth 2 (two) times daily with a meal. 60 tablet 2  . pantoprazole (PROTONIX) 40 MG tablet Take 1 tablet (40 mg total) by mouth daily. 30 tablet 3  . rosuvastatin (CRESTOR) 5 MG tablet Take 1 tablet (5 mg total) by mouth daily. 90 tablet 3   No current facility-administered medications for this visit.    Allergies:   Codeine and Morphine and related    Social History:  The patient  reports that she has never smoked. She has never used smokeless tobacco. She reports that she does not drink alcohol or use illicit drugs.   Family History:  The patient's family history includes  Alzheimer's disease in her maternal aunt and maternal grandmother; Breast cancer in her maternal aunt; Cancer in her other; Colitis in her mother; Berenice Primas' disease in her son; Healthy in her daughter and sister; Heart attack in her maternal grandfather and paternal grandfather; Heart disease in her father and mother; Hypothyroidism in her mother; Lung cancer in her father and mother; Stroke in her paternal aunt; Ulcerative colitis in her other.    ROS:  Please see the history of present illness.   Otherwise, review of systems are positive for none.   All other systems are reviewed and negative.    PHYSICAL EXAM: VS:  BP 120/64 mmHg  Pulse 86  Ht 5\' 7"  (1.702 m)  Wt 215 lb (97.523 kg)  BMI 33.67 kg/m2  SpO2 99% , BMI Body mass index is 33.67 kg/(m^2). GEN: Well nourished, well developed, in no acute distress HEENT: normal Neck: no JVD, carotid bruits, or masses Cardiac: RRR; no murmurs, rubs, or gallops,no edema  Respiratory:  clear to auscultation bilaterally, normal work of breathing GI: soft, nontender, nondistended, + BS MS: no deformity or atrophy Skin: warm and dry, no rash Neuro:  Strength and sensation are intact Psych: euthymic mood, full affect   EKG:  EKG is not ordered today.   Recent Labs: 09/23/2014: Pro B Natriuretic peptide (BNP) <5.0 09/24/2014: Hemoglobin 11.1*; Platelets 191 10/05/2014: ALT 13; BUN 11; Creatinine 0.8; Potassium 3.8; Sodium 139    Lipid Panel    Component Value Date/Time   CHOL 216* 10/08/2013 0802   TRIG 91 10/08/2013 0802   HDL 60 10/08/2013 0802   CHOLHDL 3.6 10/08/2013 0802   VLDL 18 10/08/2013 0802   LDLCALC 138* 10/08/2013 0802      Wt Readings from Last 3 Encounters:  11/24/14 215 lb (97.523 kg)  10/05/14 215 lb 2 oz (97.58 kg)  09/24/14 214 lb 14.4 oz (97.478 kg)     Echo 09/26/2014 Left ventricle: The cavity size was normal. There was mild concentric hypertrophy. Systolic function was normal. The estimated ejection  fraction was in the range of 60% to 65%. Wall motion was normal; there were no regional wall motion abnormalities. Doppler parameters are consistent with abnormal left ventricular relaxation (grade 1 diastolic dysfunction). There was no evidence of elevated ventricular filling pressure by Doppler parameters. - Aortic valve: Trileaflet; normal thickness leaflets. - Mitral valve: Structurally normal valve. - Left atrium: The atrium was normal in size. - Right ventricle: Systolic function was normal. - Right atrium: The atrium was normal in size. - Tricuspid valve: There was mild regurgitation. - Pulmonic valve: There was no regurgitation. - Pulmonary arteries:  Systolic pressure was within the normal range. - Inferior vena cava: The vessel was normal in size. - Pericardium, extracardiac: There was no pericardial effusion.  Impressions:  - Normal biventricular size and systolic function. Abnormal relaxation with normal filling pressures. Mild tricuspid regurgitation. Normal RVSP.  ASSESSMENT AND PLAN:   1. Chest pain: - negative serial trop, no EKG changes. Has been fairly active recently without CP - cardiac risk factor include HLD, DM and obesity. FHx of CAD however no early MI before age 57 - sec to HYPERTENSION, resolved since on amlodipine, mild LVH on echo  2. Hyperlipidemia - started on crestor 5 mg, we will check CMP and NMR lipids and LPa 3. DM  4. GERD s/p gastric sleeve in 2013 5. hypothyroidism  6. long-standing history of mitral valve prolapse - not shown on recent echo, no MR   Follow up in 6 months.  Signed, Dorothy Spark, MD  11/24/2014 8:32 AM    [facility] [place], [place],   [address] Phone: [phone]; Fax: [phone]

## 2014-11-24 NOTE — Patient Instructions (Signed)
Your physician recommends that you continue on your current medications as directed. Please refer to the Current Medication list given to you today.   Your physician recommends that you return for lab work in: TODAY (CMET, NMR WITH LIPIDS, LIPO A)     Your physician wants you to follow-up in: [place] will receive a reminder letter in the mail two months in advance. If you don't receive a letter, please call our office to schedule the follow-up appointment.

## 2014-11-25 LAB — LIPOPROTEIN A (LPA): Lipoprotein (a): 11 mg/dL (ref 0–30)

## 2014-11-26 ENCOUNTER — Telehealth: Payer: Self-pay | Admitting: Cardiology

## 2014-11-26 LAB — NMR LIPOPROFILE WITH LIPIDS
Cholesterol, Total: 153 mg/dL (ref 100–199)
HDL Particle Number: 38.9 umol/L (ref >=30.5)
HDL Size: 10.1 nm (ref >=9.2)
HDL-C: 68 mg/dL (ref >39)
LDL (calc): 50 mg/dL (ref 0–99)
LDL Particle Number: 693 nmol/L (ref <1000)
LDL Size: 21.3 nm (ref >=20.8)
LP-IR Score: 33 (ref <=45)
Large HDL-P: 13.1 umol/L (ref >=4.8)
Large VLDL-P: 5.7 nmol/L — ABNORMAL HIGH (ref <=2.7)
Small LDL Particle Number: <90 nmol/L (ref <=527)
Triglycerides: 174 mg/dL — ABNORMAL HIGH (ref 0–149)
VLDL Size: 54.9 nm — ABNORMAL HIGH (ref <=46.6)

## 2014-11-26 NOTE — Telephone Encounter (Signed)
Spoke with the pt about her lab results per Dr Meda Coffee.  Pt states her Husband endorsed those to her and she appreciates the follow-up.

## 2014-11-26 NOTE — Telephone Encounter (Signed)
New problem ° ° °Pt returning your call. °

## 2014-11-27 ENCOUNTER — Other Ambulatory Visit: Payer: Self-pay | Admitting: Physician Assistant

## 2014-11-27 NOTE — Telephone Encounter (Signed)
Medication Detail      Disp Refills Start End     Icosapent Ethyl (VASCEPA) 1 G CAPS 120 capsule 0 10/05/2014     Sig: Take 1 tablet by mouth daily.    E-Prescribing Status: Receipt confirmed by pharmacy (10/05/2014 4:35 PM EST)   Associated Diagnoses    Hyperlipidemia LDL goal <100 - Primary     Pharmacy    [facility] [place] [facility] [place]     Patient's chart and last office visit note states that pt is to take medication Once Daily; pharmacy is requesting refill for Twice daily [either way should be early request]. Please Advise on dosing instructions and refills/SLS

## 2014-12-16 ENCOUNTER — Telehealth: Payer: Self-pay | Admitting: *Deleted

## 2014-12-16 NOTE — Telephone Encounter (Signed)
Patient unavailable at time of Pre-Visit call

## 2014-12-16 NOTE — Progress Notes (Signed)
error 

## 2014-12-18 ENCOUNTER — Ambulatory Visit (INDEPENDENT_AMBULATORY_CARE_PROVIDER_SITE_OTHER): Payer: 59 | Admitting: Physician Assistant

## 2014-12-18 ENCOUNTER — Other Ambulatory Visit: Payer: Self-pay | Admitting: Physician Assistant

## 2014-12-18 ENCOUNTER — Encounter: Payer: Self-pay | Admitting: Physician Assistant

## 2014-12-18 VITALS — BP 110/83 | HR 75 | Temp 97.9°F | Resp 16 | Ht 67.0 in | Wt 215.5 lb

## 2014-12-18 DIAGNOSIS — Z1231 Encounter for screening mammogram for malignant neoplasm of breast: Secondary | ICD-10-CM

## 2014-12-18 DIAGNOSIS — Z0000 Encounter for general adult medical examination without abnormal findings: Secondary | ICD-10-CM

## 2014-12-18 DIAGNOSIS — Z111 Encounter for screening for respiratory tuberculosis: Secondary | ICD-10-CM

## 2014-12-18 DIAGNOSIS — Z23 Encounter for immunization: Secondary | ICD-10-CM

## 2014-12-18 LAB — BASIC METABOLIC PANEL
BUN: 11 mg/dL (ref 6–23)
CO2: 31 mEq/L (ref 19–32)
Calcium: 9.3 mg/dL (ref 8.4–10.5)
Chloride: 105 mEq/L (ref 96–112)
Creatinine, Ser: 0.69 mg/dL (ref 0.40–1.20)
GFR: 93.86 mL/min (ref >60.00)
Glucose, Bld: 163 mg/dL — ABNORMAL HIGH (ref 70–99)
Potassium: 4 mEq/L (ref 3.5–5.1)
SODIUM: 140 meq/L (ref 135–145)

## 2014-12-18 LAB — URINALYSIS, ROUTINE W REFLEX MICROSCOPIC
Bilirubin Urine: NEGATIVE
Hgb urine dipstick: NEGATIVE
KETONES UR: NEGATIVE
LEUKOCYTES UA: NEGATIVE
Nitrite: NEGATIVE
Specific Gravity, Urine: >=1.03 — AB (ref 1.000–1.030)
TOTAL PROTEIN, URINE-UPE24: NEGATIVE
URINE GLUCOSE: NEGATIVE
UROBILINOGEN UA: 0.2 (ref 0.0–1.0)
pH: 5.5 (ref 5.0–8.0)

## 2014-12-18 LAB — HEPATIC FUNCTION PANEL
ALK PHOS: 51 U/L (ref 39–117)
ALT: 15 U/L (ref 0–35)
AST: 14 U/L (ref 0–37)
Albumin: 3.9 g/dL (ref 3.5–5.2)
Bilirubin, Direct: 0.2 mg/dL (ref 0.0–0.3)
Total Bilirubin: 0.7 mg/dL (ref 0.2–1.2)
Total Protein: 6.4 g/dL (ref 6.0–8.3)

## 2014-12-18 LAB — LIPID PANEL
CHOLESTEROL: 138 mg/dL (ref 0–200)
HDL: 65.9 mg/dL (ref >39.00)
LDL Cholesterol: 59 mg/dL (ref 0–99)
NonHDL: 72.1
Total CHOL/HDL Ratio: 2
Triglycerides: 68 mg/dL (ref 0.0–149.0)
VLDL: 13.6 mg/dL (ref 0.0–40.0)

## 2014-12-18 LAB — CBC
HEMATOCRIT: 34 % — AB (ref 36.0–46.0)
HEMOGLOBIN: 11.6 g/dL — AB (ref 12.0–15.0)
MCHC: 34 g/dL (ref 30.0–36.0)
MCV: 86.6 fl (ref 78.0–100.0)
Platelets: 211 10*3/uL (ref 150.0–400.0)
RBC: 3.93 Mil/uL (ref 3.87–5.11)
RDW: 13 % (ref 11.5–15.5)
WBC: 5.6 10*3/uL (ref 4.0–10.5)

## 2014-12-18 LAB — TSH: TSH: 0.47 u[IU]/mL (ref 0.35–4.50)

## 2014-12-18 MED ORDER — RAMIPRIL 2.5 MG PO CAPS: 2.5000 mg | ORAL_CAPSULE | Freq: Every day | ORAL | Status: DC

## 2014-12-18 MED ORDER — PANTOPRAZOLE SODIUM 40 MG PO TBEC: 40.0000 mg | DELAYED_RELEASE_TABLET | Freq: Every day | ORAL | Status: DC

## 2014-12-18 MED ORDER — LEVOTHYROXINE SODIUM 175 MCG PO TABS: 175.0000 ug | ORAL_TABLET | Freq: Every day | ORAL | Status: DC

## 2014-12-18 NOTE — Assessment & Plan Note (Signed)
I have reviewed the patient's medical history in detail and updated the computerized patient record. Patient up-to-date on most parameters.  Needs Hep B booster today for school and titer was negative. PPD placed. Will obtain fasting labs today. Preventive care discussed.  Handout given.

## 2014-12-18 NOTE — Addendum Note (Signed)
Addended by: Rockwell Germany on: 12/18/2014 02:57 PM   Modules accepted: Orders

## 2014-12-18 NOTE — Progress Notes (Signed)
Patient presents to clinic today for annual exam.  Patient is fasting for labs.  Acute Concerns: No acute concerns today.  Health Maintenance: Dental -- up-to-date Vision -- up-to-date Immunizations -- Needs Tetanus booster today.  Colonoscopy -- Last in 2014.  Due 2019. Mammogram -- Overdue.  Is setting up on her own. PAP -- s/p Hysterectomy.  Last in 2015.  Followed by OB/GYN.  Past Medical History  Diagnosis Date  . Thyroid disease   . Hyperlipidemia     Had Gastric Sleeve  . Obesity     Had Gastric Sleeve  . MVP (mitral valve prolapse)     hx. of [>30 yrs]  . Hypothyroidism   . History of kidney stones   . Chicken pox   . Diabetes mellitus     Resolved w/Gastric Sleeve  . GERD (gastroesophageal reflux disease)   . Arthritis     RA  IN KNEE    Past Surgical History  Procedure Laterality Date  . Abdominal hysterectomy    . Tonsillectomy    . Kidney stone surgery    . Knee arthroscopy  02/2012    meniscus repair left 5'13  . Laparoscopic gastric sleeve resection  10/08/2012    Procedure: LAPAROSCOPIC GASTRIC SLEEVE RESECTION;  Surgeon: Madilyn Hook, DO;  Location: [facility];  Service: General;  Laterality: N/A;  . Cesarean section  1995    Current Outpatient Prescriptions on File Prior to Visit  Medication Sig Dispense Refill  . amitriptyline (ELAVIL) 10 MG tablet Take 10 mg by mouth at bedtime.    Marland Kitchen aspirin 81 MG tablet Take 81 mg by mouth daily.    Marland Kitchen BYDUREON 2 MG SUSR Inject 2 mg into the skin once a week. On Sunday  12  . Calcium Carbonate-Vitamin D (CALCIUM-VITAMIN D3 PO) Take 1 each by mouth every other day.    . celecoxib (CELEBREX) 100 MG capsule Take 100 mg by mouth 2 (two) times daily as needed.  12  . levothyroxine (SYNTHROID) 175 MCG tablet Take 175 mcg by mouth daily before breakfast. Brand ONLY    . metFORMIN (GLUCOPHAGE) 500 MG tablet Take 1 tablet (500 mg total) by mouth 2 (two) times daily with a meal. 60 tablet 2  . pantoprazole (PROTONIX) 40  MG tablet Take 1 tablet (40 mg total) by mouth daily. 30 tablet 3  . rosuvastatin (CRESTOR) 5 MG tablet Take 1 tablet (5 mg total) by mouth daily. 90 tablet 3  . VASCEPA 1 G CAPS TAKE 1 CAPSULE BY MOUTH TWICE DAILY 120 capsule 0   No current facility-administered medications on file prior to visit.    Allergies  Allergen Reactions  . Codeine Nausea And Vomiting  . Morphine And Related Nausea And Vomiting    Family History  Problem Relation Age of Onset  . Cancer Other   . Ulcerative colitis Other   . Lung cancer Mother   . Colitis Mother   . Heart disease Mother   . Lung cancer Father   . Heart disease Father   . Alzheimer's disease Maternal Grandmother   . Heart attack Maternal Grandfather   . Heart attack Paternal Grandfather   . Breast cancer Maternal Aunt   . Alzheimer's disease Maternal Aunt   . Hypothyroidism Mother   . Stroke Paternal Aunt   . Healthy Sister     x3  . Healthy Daughter     x3  . Graves' disease Son     History   Social  History  . Marital Status: Married    Spouse Name: N/A  . Number of Children: N/A  . Years of Education: N/A   Occupational History  . Not on file.   Social History Main Topics  . Smoking status: Never Smoker   . Smokeless tobacco: Never Used  . Alcohol Use: No     Comment: rare occ.  . Drug Use: No  . Sexual Activity: Yes   Other Topics Concern  . Not on file   Social History Narrative   Review of Systems  Constitutional: Negative for fever and weight loss.  HENT: Negative for ear discharge, ear pain, hearing loss and tinnitus.   Eyes: Negative for blurred vision, double vision, photophobia and pain.  Respiratory: Negative for cough and shortness of breath.   Cardiovascular: Negative for chest pain and palpitations.  Gastrointestinal: Negative for heartburn, nausea, vomiting, abdominal pain, diarrhea, constipation, blood in stool and melena.  Genitourinary: Negative for dysuria, urgency, frequency, hematuria and  flank pain.  Musculoskeletal: Negative for falls.  Neurological: Negative for dizziness, loss of consciousness and headaches.  Endo/Heme/Allergies: Negative for environmental allergies.  Psychiatric/Behavioral: Negative for depression, suicidal ideas, hallucinations and substance abuse. The patient is not nervous/anxious and does not have insomnia.    BP 110/83 mmHg  Pulse 75  Temp(Src) 97.9 F (36.6 C) (Oral)  Resp 16  Ht $R'5\' 7"'qf$  (1.702 m)  Wt 215 lb 8 oz (97.75 kg)  BMI 33.74 kg/m2  SpO2 100%  Physical Exam  Constitutional: She is oriented to person, place, and time and well-developed, well-nourished, and in no distress.  HENT:  Head: Normocephalic and atraumatic.  Right Ear: Tympanic membrane, external ear and ear canal normal.  Left Ear: Tympanic membrane, external ear and ear canal normal.  Nose: Nose normal. No mucosal edema.  Mouth/Throat: Uvula is midline, oropharynx is clear and moist and mucous membranes are normal. No oropharyngeal exudate or posterior oropharyngeal erythema.  Eyes: Conjunctivae are normal. Pupils are equal, round, and reactive to light.  Neck: Neck supple. No thyromegaly present.  Cardiovascular: Normal rate, regular rhythm, normal heart sounds and intact distal pulses.   Pulmonary/Chest: Effort normal and breath sounds normal. No respiratory distress. She has no wheezes. She has no rales.  Abdominal: Soft. Bowel sounds are normal. She exhibits no distension and no mass. There is no tenderness. There is no rebound and no guarding.  Lymphadenopathy:    She has no cervical adenopathy.  Neurological: She is alert and oriented to person, place, and time. No cranial nerve deficit.  Skin: Skin is warm and dry. No rash noted.  Psychiatric: Affect normal.  Vitals reviewed.  Recent Results (from the past 2160 hour(s))  Pro b natriuretic peptide (order if patient c/o SOB ONLY)     Status: None   Collection Time: 09/23/14  9:30 PM  Result Value Ref Range   Pro  B Natriuretic peptide (BNP) <5.0 0 - 125 pg/mL  Comprehensive metabolic panel     Status: Abnormal   Collection Time: 09/23/14  9:30 PM  Result Value Ref Range   Sodium 137 137 - 147 mEq/L   Potassium 3.9 3.7 - 5.3 mEq/L   Chloride 99 96 - 112 mEq/L   CO2 24 19 - 32 mEq/L   Glucose, Bld 232 (H) 70 - 99 mg/dL   BUN 11 6 - 23 mg/dL   Creatinine, Ser 0.89 0.50 - 1.10 mg/dL   Calcium 9.3 8.4 - 10.5 mg/dL   Total Protein 6.7 6.0 -  8.3 g/dL   Albumin 3.5 3.5 - 5.2 g/dL   AST 62 (H) 0 - 37 U/L   ALT 30 0 - 35 U/L   Alkaline Phosphatase 92 39 - 117 U/L   Total Bilirubin 0.5 0.3 - 1.2 mg/dL   GFR calc non Af Amer 72 (L) >90 mL/min   GFR calc Af Amer 84 (L) >90 mL/min    Comment: (NOTE) The eGFR has been calculated using the CKD EPI equation. This calculation has not been validated in all clinical situations. eGFR's persistently <90 mL/min signify possible Chronic Kidney Disease.    Anion gap 14 5 - 15  CBC     Status: Abnormal   Collection Time: 09/23/14  9:30 PM  Result Value Ref Range   WBC 7.4 4.0 - 10.5 K/uL   RBC 4.06 3.87 - 5.11 MIL/uL   Hemoglobin 12.0 12.0 - 15.0 g/dL   HCT 35.2 (L) 36.0 - 46.0 %   MCV 86.7 78.0 - 100.0 fL   MCH 29.6 26.0 - 34.0 pg   MCHC 34.1 30.0 - 36.0 g/dL   RDW 12.5 11.5 - 15.5 %   Platelets 197 150 - 400 K/uL  I-stat troponin, ED (not at [facility])     Status: None   Collection Time: 09/23/14  9:34 PM  Result Value Ref Range   Troponin i, poc 0.00 0.00 - 0.08 ng/mL   Comment 3            Comment: Due to the release kinetics of cTnI, a negative result within the first hours of the onset of symptoms does not rule out myocardial infarction with certainty. If myocardial infarction is still suspected, repeat the test at appropriate intervals.   Hemoglobin A1c     Status: Abnormal   Collection Time: 09/24/14  2:08 AM  Result Value Ref Range   Hgb A1c MFr Bld 7.4 (H) <5.7 %    Comment: (NOTE)                                                                        According to the ADA Clinical Practice Recommendations for 2011, when HbA1c is used as a screening test:  >=6.5%   Diagnostic of Diabetes Mellitus           (if abnormal result is confirmed) 5.7-6.4%   Increased risk of developing Diabetes Mellitus References:Diagnosis and Classification of Diabetes Mellitus,Diabetes EFEO,7121,97(JOITG 1):S62-S69 and Standards of Medical Care in         Diabetes - 2011,Diabetes PQDI,2641,58 (Suppl 1):S11-S61.    Mean Plasma Glucose 166 (H) <117 mg/dL    Comment: Performed at [facility]  CBC     Status: Abnormal   Collection Time: 09/24/14  2:08 AM  Result Value Ref Range   WBC 6.2 4.0 - 10.5 K/uL   RBC 3.97 3.87 - 5.11 MIL/uL   Hemoglobin 11.1 (L) 12.0 - 15.0 g/dL   HCT 33.7 (L) 36.0 - 46.0 %   MCV 84.9 78.0 - 100.0 fL   MCH 28.0 26.0 - 34.0 pg   MCHC 32.9 30.0 - 36.0 g/dL   RDW 12.5 11.5 - 15.5 %   Platelets 191 150 - 400 K/uL  Troponin I-serum (0, 3, 6  hours)     Status: None   Collection Time: 09/24/14  2:08 AM  Result Value Ref Range   Troponin I <0.30 <0.30 ng/mL    Comment:        Due to the release kinetics of cTnI, a negative result within the first hours of the onset of symptoms does not rule out myocardial infarction with certainty. If myocardial infarction is still suspected, repeat the test at appropriate intervals.   Glucose, capillary     Status: Abnormal   Collection Time: 09/24/14  4:13 AM  Result Value Ref Range   Glucose-Capillary 131 (H) 70 - 99 mg/dL  Troponin I-serum (0, 3, 6 hours)     Status: None   Collection Time: 09/24/14  5:35 AM  Result Value Ref Range   Troponin I <0.30 <0.30 ng/mL    Comment:        Due to the release kinetics of cTnI, a negative result within the first hours of the onset of symptoms does not rule out myocardial infarction with certainty. If myocardial infarction is still suspected, repeat the test at appropriate intervals.   Troponin I-serum (0, 3, 6 hours)      Status: None   Collection Time: 09/24/14  7:36 AM  Result Value Ref Range   Troponin I <0.30 <0.30 ng/mL    Comment:        Due to the release kinetics of cTnI, a negative result within the first hours of the onset of symptoms does not rule out myocardial infarction with certainty. If myocardial infarction is still suspected, repeat the test at appropriate intervals.   Glucose, capillary     Status: Abnormal   Collection Time: 09/24/14  8:12 AM  Result Value Ref Range   Glucose-Capillary 134 (H) 70 - 99 mg/dL   Comment 1 Notify RN   Glucose, capillary     Status: Abnormal   Collection Time: 09/24/14 11:28 AM  Result Value Ref Range   Glucose-Capillary 143 (H) 70 - 99 mg/dL   Comment 1 Notify RN   Comp Met (CMET)     Status: Abnormal   Collection Time: 10/05/14  4:57 PM  Result Value Ref Range   Sodium 139 135 - 145 mEq/L   Potassium 3.8 3.5 - 5.1 mEq/L   Chloride 110 96 - 112 mEq/L   CO2 24 19 - 32 mEq/L   Glucose, Bld 165 (H) 70 - 99 mg/dL   BUN 11 6 - 23 mg/dL   Creatinine, Ser 0.8 0.4 - 1.2 mg/dL   Total Bilirubin 0.5 0.2 - 1.2 mg/dL   Alkaline Phosphatase 64 39 - 117 U/L   AST 17 0 - 37 U/L   ALT 13 0 - 35 U/L   Total Protein 6.7 6.0 - 8.3 g/dL   Albumin 4.0 3.5 - 5.2 g/dL   Calcium 9.2 8.4 - 10.5 mg/dL   GFR 75.90 >60.00 mL/min  Urine Microalbumin w/creat. ratio     Status: None   Collection Time: 10/05/14  4:57 PM  Result Value Ref Range   Microalb, Ur 0.6 0.0 - 1.9 mg/dL   Creatinine,U 149.8 mg/dL   Microalb Creat Ratio 0.4 0.0 - 30.0 mg/g  Comp Met (CMET)     Status: Abnormal   Collection Time: 11/24/14  8:57 AM  Result Value Ref Range   Sodium 137 135 - 145 mEq/L   Potassium 3.8 3.5 - 5.1 mEq/L   Chloride 103 96 - 112 mEq/L   CO2 30 19 -  32 mEq/L   Glucose, Bld 227 (H) 70 - 99 mg/dL   BUN 12 6 - 23 mg/dL   Creatinine, Ser 0.68 0.40 - 1.20 mg/dL   Total Bilirubin 0.4 0.2 - 1.2 mg/dL   Alkaline Phosphatase 62 39 - 117 U/L   AST 13 0 - 37 U/L   ALT 14  0 - 35 U/L   Total Protein 6.5 6.0 - 8.3 g/dL   Albumin 4.0 3.5 - 5.2 g/dL   Calcium 9.4 8.4 - 10.5 mg/dL   GFR 95.48 >60.00 mL/min  Lipoprotein A (LPA)     Status: None   Collection Time: 11/24/14  8:57 AM  Result Value Ref Range   Lipoprotein (a) 11 0 - 30 mg/dL    Comment:   Several studies indicate that African Americans, in the aggregate, have median Lp(a) values that are two to three times higher than median values in Caucasians. However, there is no information on whether this imparts significantly increased atherosclerotic risk.     NMR Lipoprofile with Lipids     Status: Abnormal   Collection Time: 11/24/14  8:57 AM  Result Value Ref Range   LDL Particle Number 693 <1000 nmol/L    Comment:                           Low                   < 1000                           Moderate         1000 - 1299                           Borderline-High  1300 - 1599                           High             1600 - 2000                           Very High             > 2000    LDL (calc) 50 0 - 99 mg/dL    Comment:                           Optimal               <  100                           Above optimal     100 -  129                           Borderline        130 -  159                           High              160 -  189  Very high             >  189 LDL-C is inaccurate if patient is non-fasting.    HDL-C 68 >39 mg/dL   Triglycerides 615 (H) 0 - 149 mg/dL   Cholesterol, Total 432 100 - 199 mg/dL   HDL Particle Number 08.0 >=30.5 umol/L   Large HDL-P 13.1 >=4.8 umol/L   Large VLDL-P 5.7 (H) <=2.7 nmol/L   Small LDL Particle Number <90 <=527 nmol/L   LDL Size 21.3 >=20.8 nm   HDL Size 10.1 >=9.2 nm   VLDL Size 54.9 (H) <=46.6 nm   LP-IR Score 33 <=45    Comment:  ----------------------------------------------------------              INSULIN RESISTANCE / DIABETES RISK MARKERS            <--Insulin Sensitive      Insulin Resistant-->                        Percentile in Reference Population   Large VLDL-P      Low     25th     50th     75th     High                     <0.9    0.9      2.7      6.9      >6.9   Small LDL-P       Low     25th     50th     75th     High                     <117    117      527      839      >839   Large HDL-P       High    75th     50th     25th     Low                     >7.3    7.3      4.8      3.1      <3.1   VLDL Size         Small   25th     50th     75th     Large                     <42.4   42.4     46.6     52.5     >52.5   LDL Size          Large   70BU     50th     25th     Small                     >21.2   21.2     20.8     20.4     <20.4   HDL Size          Large   32JL     50th     25th     Small                     >9.6    9.6  9.2      8.9      <8.9   Insulin Resistance Score    LP-IR SCORE       Low     25th     50th     75th     High                     <27     27       45       63       >63    __________________________________________________________ LP-IR Score is inaccurate if patient is non-fasting. The LP-IR score is a laboratory developed index that has been associated with insulin resistance and diabetes risk and should be used as one component of a physician's clinical assessment. Neither the LP-IR score nor the subclasses listed above have been cleared by the [place] Food and Drug Administration.     Assessment/Plan: Visit for preventive health examination I have reviewed the patient's medical history in detail and updated the computerized patient record. Patient up-to-date on most parameters.  Needs Hep B booster today for school and titer was negative. PPD placed. Will obtain fasting labs today. Preventive care discussed.  Handout given.

## 2014-12-18 NOTE — Patient Instructions (Signed)
Please go to the lab for blood work.  I will call you with your results. Please return to clinic later today for PPD placement.  You will need to return Monday morning for a reading.  Continue medications as directed. I will send 90-day supplies in after we check your labs, so that we can make sure no dosing changes are needed.  Stop the amlodipine.  Start the ramipril 2.5 mg daily.  Stay well hydrated.   Follow-up in 1 month.   Preventive Care for Adults A healthy lifestyle and preventive care can promote health and wellness. Preventive health guidelines for women include the following key practices.  A routine yearly physical is a good way to check with your health care provider about your health and preventive screening. It is a chance to share any concerns and updates on your health and to receive a thorough exam.  Visit your dentist for a routine exam and preventive care every 6 months. Brush your teeth twice a day and floss once a day. Good oral hygiene prevents tooth decay and gum disease.  The frequency of eye exams is based on your age, health, family medical history, use of contact lenses, and other factors. Follow your health care provider's recommendations for frequency of eye exams.  Eat a healthy diet. Foods like vegetables, fruits, whole grains, low-fat dairy products, and lean protein foods contain the nutrients you need without too many calories. Decrease your intake of foods high in solid fats, added sugars, and salt. Eat the right amount of calories for you.Get information about a proper diet from your health care provider, if necessary.  Regular physical exercise is one of the most important things you can do for your health. Most adults should get at least 150 minutes of moderate-intensity exercise (any activity that increases your heart rate and causes you to sweat) each week. In addition, most adults need muscle-strengthening exercises on 2 or more days a  week.  Maintain a healthy weight. The body mass index (BMI) is a screening tool to identify possible weight problems. It provides an estimate of body fat based on height and weight. Your health care provider can find your BMI and can help you achieve or maintain a healthy weight.For adults 20 years and older:  A BMI below 18.5 is considered underweight.  A BMI of 18.5 to 24.9 is normal.  A BMI of 25 to 29.9 is considered overweight.  A BMI of 30 and above is considered obese.  Maintain normal blood lipids and cholesterol levels by exercising and minimizing your intake of saturated fat. Eat a balanced diet with plenty of fruit and vegetables. Blood tests for lipids and cholesterol should begin at age 57 and be repeated every 5 years. If your lipid or cholesterol levels are high, you are over 50, or you are at high risk for heart disease, you may need your cholesterol levels checked more frequently.Ongoing high lipid and cholesterol levels should be treated with medicines if diet and exercise are not working.  If you smoke, find out from your health care provider how to quit. If you do not use tobacco, do not start.  Lung cancer screening is recommended for adults aged 84-80 years who are at high risk for developing lung cancer because of a history of smoking. A yearly low-dose CT scan of the lungs is recommended for people who have at least a 30-pack-year history of smoking and are a current smoker or have quit within the past 15  years. A pack year of smoking is smoking an average of 1 pack of cigarettes a day for 1 year (for example: 1 pack a day for 30 years or 2 packs a day for 15 years). Yearly screening should continue until the smoker has stopped smoking for at least 15 years. Yearly screening should be stopped for people who develop a health problem that would prevent them from having lung cancer treatment.  If you are pregnant, do not drink alcohol. If you are breastfeeding, be very  cautious about drinking alcohol. If you are not pregnant and choose to drink alcohol, do not have more than 1 drink per day. One drink is considered to be 12 ounces (355 mL) of beer, 5 ounces (148 mL) of wine, or 1.5 ounces (44 mL) of liquor.  Avoid use of street drugs. Do not share needles with anyone. Ask for help if you need support or instructions about stopping the use of drugs.  High blood pressure causes heart disease and increases the risk of stroke. Your blood pressure should be checked at least every 1 to 2 years. Ongoing high blood pressure should be treated with medicines if weight loss and exercise do not work.  If you are 97-67 years old, ask your health care provider if you should take aspirin to prevent strokes.  Diabetes screening involves taking a blood sample to check your fasting blood sugar level. This should be done once every 3 years, after age 19, if you are within normal weight and without risk factors for diabetes. Testing should be considered at a younger age or be carried out more frequently if you are overweight and have at least 1 risk factor for diabetes.  Breast cancer screening is essential preventive care for women. You should practice "breast self-awareness." This means understanding the normal appearance and feel of your breasts and may include breast self-examination. Any changes detected, no matter how small, should be reported to a health care provider. Women in their 76s and 30s should have a clinical breast exam (CBE) by a health care provider as part of a regular health exam every 1 to 3 years. After age 49, women should have a CBE every year. Starting at age 61, women should consider having a mammogram (breast X-ray test) every year. Women who have a family history of breast cancer should talk to their health care provider about genetic screening. Women at a high risk of breast cancer should talk to their health care providers about having an MRI and a mammogram  every year.  Breast cancer gene (BRCA)-related cancer risk assessment is recommended for women who have family members with BRCA-related cancers. BRCA-related cancers include breast, ovarian, tubal, and peritoneal cancers. Having family members with these cancers may be associated with an increased risk for harmful changes (mutations) in the breast cancer genes BRCA1 and BRCA2. Results of the assessment will determine the need for genetic counseling and BRCA1 and BRCA2 testing.  Routine pelvic exams to screen for cancer are no longer recommended for nonpregnant women who are considered low risk for cancer of the pelvic organs (ovaries, uterus, and vagina) and who do not have symptoms. Ask your health care provider if a screening pelvic exam is right for you.  If you have had past treatment for cervical cancer or a condition that could lead to cancer, you need Pap tests and screening for cancer for at least 20 years after your treatment. If Pap tests have been discontinued, your risk factors (such  as having a new sexual partner) need to be reassessed to determine if screening should be resumed. Some women have medical problems that increase the chance of getting cervical cancer. In these cases, your health care provider may recommend more frequent screening and Pap tests.  The HPV test is an additional test that may be used for cervical cancer screening. The HPV test looks for the virus that can cause the cell changes on the cervix. The cells collected during the Pap test can be tested for HPV. The HPV test could be used to screen women aged 33 years and older, and should be used in women of any age who have unclear Pap test results. After the age of 41, women should have HPV testing at the same frequency as a Pap test.  Colorectal cancer can be detected and often prevented. Most routine colorectal cancer screening begins at the age of 72 years and continues through age 5 years. However, your health care  provider may recommend screening at an earlier age if you have risk factors for colon cancer. On a yearly basis, your health care provider may provide home test kits to check for hidden blood in the stool. Use of a small camera at the end of a tube, to directly examine the colon (sigmoidoscopy or colonoscopy), can detect the earliest forms of colorectal cancer. Talk to your health care provider about this at age 46, when routine screening begins. Direct exam of the colon should be repeated every 5-10 years through age 1 years, unless early forms of pre-cancerous polyps or small growths are found.  People who are at an increased risk for hepatitis B should be screened for this virus. You are considered at high risk for hepatitis B if:  You were born in a country where hepatitis B occurs often. Talk with your health care provider about which countries are considered high risk.  Your parents were born in a high-risk country and you have not received a shot to protect against hepatitis B (hepatitis B vaccine).  You have HIV or AIDS.  You use needles to inject street drugs.  You live with, or have sex with, someone who has hepatitis B.  You get hemodialysis treatment.  You take certain medicines for conditions like cancer, organ transplantation, and autoimmune conditions.  Hepatitis C blood testing is recommended for all people born from [birth year] through 1965 and any individual with known risks for hepatitis C.  Practice safe sex. Use condoms and avoid high-risk sexual practices to reduce the spread of sexually transmitted infections (STIs). STIs include gonorrhea, chlamydia, syphilis, trichomonas, herpes, HPV, and human immunodeficiency virus (HIV). Herpes, HIV, and HPV are viral illnesses that have no cure. They can result in disability, cancer, and death.  You should be screened for sexually transmitted illnesses (STIs) including gonorrhea and chlamydia if:  You are sexually active and are  younger than 24 years.  You are older than 24 years and your health care provider tells you that you are at risk for this type of infection.  Your sexual activity has changed since you were last screened and you are at an increased risk for chlamydia or gonorrhea. Ask your health care provider if you are at risk.  If you are at risk of being infected with HIV, it is recommended that you take a prescription medicine daily to prevent HIV infection. This is called preexposure prophylaxis (PrEP). You are considered at risk if:  You are a heterosexual woman, are sexually active,  and are at increased risk for HIV infection.  You take drugs by injection.  You are sexually active with a partner who has HIV.  Talk with your health care provider about whether you are at high risk of being infected with HIV. If you choose to begin PrEP, you should first be tested for HIV. You should then be tested every 3 months for as long as you are taking PrEP.  Osteoporosis is a disease in which the bones lose minerals and strength with aging. This can result in serious bone fractures or breaks. The risk of osteoporosis can be identified using a bone density scan. Women ages 51 years and over and women at risk for fractures or osteoporosis should discuss screening with their health care providers. Ask your health care provider whether you should take a calcium supplement or vitamin D to reduce the rate of osteoporosis.  Menopause can be associated with physical symptoms and risks. Hormone replacement therapy is available to decrease symptoms and risks. You should talk to your health care provider about whether hormone replacement therapy is right for you.  Use sunscreen. Apply sunscreen liberally and repeatedly throughout the day. You should seek shade when your shadow is shorter than you. Protect yourself by wearing long sleeves, pants, a wide-brimmed hat, and sunglasses year round, whenever you are outdoors.  Once a  month, do a whole body skin exam, using a mirror to look at the skin on your back. Tell your health care provider of new moles, moles that have irregular borders, moles that are larger than a pencil eraser, or moles that have changed in shape or color.  Stay current with required vaccines (immunizations).  Influenza vaccine. All adults should be immunized every year.  Tetanus, diphtheria, and acellular pertussis (Td, Tdap) vaccine. Pregnant women should receive 1 dose of Tdap vaccine during each pregnancy. The dose should be obtained regardless of the length of time since the last dose. Immunization is preferred during the 27th-36th week of gestation. An adult who has not previously received Tdap or who does not know her vaccine status should receive 1 dose of Tdap. This initial dose should be followed by tetanus and diphtheria toxoids (Td) booster doses every 10 years. Adults with an unknown or incomplete history of completing a 3-dose immunization series with Td-containing vaccines should begin or complete a primary immunization series including a Tdap dose. Adults should receive a Td booster every 10 years.  Varicella vaccine. An adult without evidence of immunity to varicella should receive 2 doses or a second dose if she has previously received 1 dose. Pregnant females who do not have evidence of immunity should receive the first dose after pregnancy. This first dose should be obtained before leaving the health care facility. The second dose should be obtained 4-8 weeks after the first dose.  Human papillomavirus (HPV) vaccine. Females aged 13-26 years who have not received the vaccine previously should obtain the 3-dose series. The vaccine is not recommended for use in pregnant females. However, pregnancy testing is not needed before receiving a dose. If a female is found to be pregnant after receiving a dose, no treatment is needed. In that case, the remaining doses should be delayed until after the  pregnancy. Immunization is recommended for any person with an immunocompromised condition through the age of 4 years if she did not get any or all doses earlier. During the 3-dose series, the second dose should be obtained 4-8 weeks after the first dose. The third  dose should be obtained 24 weeks after the first dose and 16 weeks after the second dose.  Zoster vaccine. One dose is recommended for adults aged 48 years or older unless certain conditions are present.  Measles, mumps, and rubella (MMR) vaccine. Adults born before [birth year] generally are considered immune to measles and mumps. Adults born in [birth year] or later should have 1 or more doses of MMR vaccine unless there is a contraindication to the vaccine or there is laboratory evidence of immunity to each of the three diseases. A routine second dose of MMR vaccine should be obtained at least 28 days after the first dose for students attending postsecondary schools, health care workers, or international travelers. People who received inactivated measles vaccine or an unknown type of measles vaccine during 1963-1967 should receive 2 doses of MMR vaccine. People who received inactivated mumps vaccine or an unknown type of mumps vaccine before 1979 and are at high risk for mumps infection should consider immunization with 2 doses of MMR vaccine. For females of childbearing age, rubella immunity should be determined. If there is no evidence of immunity, females who are not pregnant should be vaccinated. If there is no evidence of immunity, females who are pregnant should delay immunization until after pregnancy. Unvaccinated health care workers born before [birth year] who lack laboratory evidence of measles, mumps, or rubella immunity or laboratory confirmation of disease should consider measles and mumps immunization with 2 doses of MMR vaccine or rubella immunization with 1 dose of MMR vaccine.  Pneumococcal 13-valent conjugate (PCV13) vaccine. When indicated, a person  who is uncertain of her immunization history and has no record of immunization should receive the PCV13 vaccine. An adult aged 29 years or older who has certain medical conditions and has not been previously immunized should receive 1 dose of PCV13 vaccine. This PCV13 should be followed with a dose of pneumococcal polysaccharide (PPSV23) vaccine. The PPSV23 vaccine dose should be obtained at least 8 weeks after the dose of PCV13 vaccine. An adult aged 28 years or older who has certain medical conditions and previously received 1 or more doses of PPSV23 vaccine should receive 1 dose of PCV13. The PCV13 vaccine dose should be obtained 1 or more years after the last PPSV23 vaccine dose.  Pneumococcal polysaccharide (PPSV23) vaccine. When PCV13 is also indicated, PCV13 should be obtained first. All adults aged 60 years and older should be immunized. An adult younger than age 53 years who has certain medical conditions should be immunized. Any person who resides in a nursing home or long-term care facility should be immunized. An adult smoker should be immunized. People with an immunocompromised condition and certain other conditions should receive both PCV13 and PPSV23 vaccines. People with human immunodeficiency virus (HIV) infection should be immunized as soon as possible after diagnosis. Immunization during chemotherapy or radiation therapy should be avoided. Routine use of PPSV23 vaccine is not recommended for American Indians, [place] Natives, or people younger than 65 years unless there are medical conditions that require PPSV23 vaccine. When indicated, people who have unknown immunization and have no record of immunization should receive PPSV23 vaccine. One-time revaccination 5 years after the first dose of PPSV23 is recommended for people aged 19-64 years who have chronic kidney failure, nephrotic syndrome, asplenia, or immunocompromised conditions. People who received 1-2 doses of PPSV23 before age 50 years  should receive another dose of PPSV23 vaccine at age 52 years or later if at least 5 years have passed since the previous dose. Doses  of PPSV23 are not needed for people immunized with PPSV23 at or after age 55 years.  Meningococcal vaccine. Adults with asplenia or persistent complement component deficiencies should receive 2 doses of quadrivalent meningococcal conjugate (MenACWY-D) vaccine. The doses should be obtained at least 2 months apart. Microbiologists working with certain meningococcal bacteria, [place] recruits, people at risk during an outbreak, and people who travel to or live in countries with a high rate of meningitis should be immunized. A first-year college student up through age 43 years who is living in a residence hall should receive a dose if she did not receive a dose on or after her 16th birthday. Adults who have certain high-risk conditions should receive one or more doses of vaccine.  Hepatitis A vaccine. Adults who wish to be protected from this disease, have certain high-risk conditions, work with hepatitis A-infected animals, work in hepatitis A research labs, or travel to or work in countries with a high rate of hepatitis A should be immunized. Adults who were previously unvaccinated and who anticipate close contact with an international adoptee during the first 60 days after arrival in the [place] from a country with a high rate of hepatitis A should be immunized.  Hepatitis B vaccine. Adults who wish to be protected from this disease, have certain high-risk conditions, may be exposed to blood or other infectious body fluids, are household contacts or sex partners of hepatitis B positive people, are clients or workers in certain care facilities, or travel to or work in countries with a high rate of hepatitis B should be immunized.  Haemophilus influenzae type b (Hib) vaccine. A previously unvaccinated person with asplenia or sickle cell disease or having a scheduled  splenectomy should receive 1 dose of Hib vaccine. Regardless of previous immunization, a recipient of a hematopoietic stem cell transplant should receive a 3-dose series 6-12 months after her successful transplant. Hib vaccine is not recommended for adults with HIV infection. Preventive Services / Frequency Ages 61 to 19 years  Blood pressure check.** / Every 1 to 2 years.  Lipid and cholesterol check.** / Every 5 years beginning at age 43.  Clinical breast exam.** / Every 3 years for women in their 25s and 40s.  BRCA-related cancer risk assessment.** / For women who have family members with a BRCA-related cancer (breast, ovarian, tubal, or peritoneal cancers).  Pap test.** / Every 2 years from ages 63 through 13. Every 3 years starting at age 40 through age 74 or 70 with a history of 3 consecutive normal Pap tests.  HPV screening.** / Every 3 years from ages 12 through ages 4 to 86 with a history of 3 consecutive normal Pap tests.  Hepatitis C blood test.** / For any individual with known risks for hepatitis C.  Skin self-exam. / Monthly.  Influenza vaccine. / Every year.  Tetanus, diphtheria, and acellular pertussis (Tdap, Td) vaccine.** / Consult your health care provider. Pregnant women should receive 1 dose of Tdap vaccine during each pregnancy. 1 dose of Td every 10 years.  Varicella vaccine.** / Consult your health care provider. Pregnant females who do not have evidence of immunity should receive the first dose after pregnancy.  HPV vaccine. / 3 doses over 6 months, if 82 and younger. The vaccine is not recommended for use in pregnant females. However, pregnancy testing is not needed before receiving a dose.  Measles, mumps, rubella (MMR) vaccine.** / You need at least 1 dose of MMR if you were born in 1957  or later. You may also need a 2nd dose. For females of childbearing age, rubella immunity should be determined. If there is no evidence of immunity, females who are not  pregnant should be vaccinated. If there is no evidence of immunity, females who are pregnant should delay immunization until after pregnancy.  Pneumococcal 13-valent conjugate (PCV13) vaccine.** / Consult your health care provider.  Pneumococcal polysaccharide (PPSV23) vaccine.** / 1 to 2 doses if you smoke cigarettes or if you have certain conditions.  Meningococcal vaccine.** / 1 dose if you are age 5 to 36 years and a Market researcher living in a residence hall, or have one of several medical conditions, you need to get vaccinated against meningococcal disease. You may also need additional booster doses.  Hepatitis A vaccine.** / Consult your health care provider.  Hepatitis B vaccine.** / Consult your health care provider.  Haemophilus influenzae type b (Hib) vaccine.** / Consult your health care provider. Ages 19 to 63 years  Blood pressure check.** / Every 1 to 2 years.  Lipid and cholesterol check.** / Every 5 years beginning at age 86 years.  Lung cancer screening. / Every year if you are aged 71-80 years and have a 30-pack-year history of smoking and currently smoke or have quit within the past 15 years. Yearly screening is stopped once you have quit smoking for at least 15 years or develop a health problem that would prevent you from having lung cancer treatment.  Clinical breast exam.** / Every year after age 18 years.  BRCA-related cancer risk assessment.** / For women who have family members with a BRCA-related cancer (breast, ovarian, tubal, or peritoneal cancers).  Mammogram.** / Every year beginning at age 79 years and continuing for as long as you are in good health. Consult with your health care provider.  Pap test.** / Every 3 years starting at age 54 years through age 65 or 40 years with a history of 3 consecutive normal Pap tests.  HPV screening.** / Every 3 years from ages 52 years through ages 66 to 38 years with a history of 3 consecutive normal Pap  tests.  Fecal occult blood test (FOBT) of stool. / Every year beginning at age 45 years and continuing until age 42 years. You may not need to do this test if you get a colonoscopy every 10 years.  Flexible sigmoidoscopy or colonoscopy.** / Every 5 years for a flexible sigmoidoscopy or every 10 years for a colonoscopy beginning at age 33 years and continuing until age 37 years.  Hepatitis C blood test.** / For all people born from [birth year] through 1966 and any individual with known risks for hepatitis C.  Skin self-exam. / Monthly.  Influenza vaccine. / Every year.  Tetanus, diphtheria, and acellular pertussis (Tdap/Td) vaccine.** / Consult your health care provider. Pregnant women should receive 1 dose of Tdap vaccine during each pregnancy. 1 dose of Td every 10 years.  Varicella vaccine.** / Consult your health care provider. Pregnant females who do not have evidence of immunity should receive the first dose after pregnancy.  Zoster vaccine.** / 1 dose for adults aged 21 years or older.  Measles, mumps, rubella (MMR) vaccine.** / You need at least 1 dose of MMR if you were born in 1957 or later. You may also need a 2nd dose. For females of childbearing age, rubella immunity should be determined. If there is no evidence of immunity, females who are not pregnant should be vaccinated. If there is no evidence of  immunity, females who are pregnant should delay immunization until after pregnancy.  Pneumococcal 13-valent conjugate (PCV13) vaccine.** / Consult your health care provider.  Pneumococcal polysaccharide (PPSV23) vaccine.** / 1 to 2 doses if you smoke cigarettes or if you have certain conditions.  Meningococcal vaccine.** / Consult your health care provider.  Hepatitis A vaccine.** / Consult your health care provider.  Hepatitis B vaccine.** / Consult your health care provider.  Haemophilus influenzae type b (Hib) vaccine.** / Consult your health care provider. Ages 2 years and  over  Blood pressure check.** / Every 1 to 2 years.  Lipid and cholesterol check.** / Every 5 years beginning at age 25 years.  Lung cancer screening. / Every year if you are aged 33-80 years and have a 30-pack-year history of smoking and currently smoke or have quit within the past 15 years. Yearly screening is stopped once you have quit smoking for at least 15 years or develop a health problem that would prevent you from having lung cancer treatment.  Clinical breast exam.** / Every year after age 26 years.  BRCA-related cancer risk assessment.** / For women who have family members with a BRCA-related cancer (breast, ovarian, tubal, or peritoneal cancers).  Mammogram.** / Every year beginning at age 4 years and continuing for as long as you are in good health. Consult with your health care provider.  Pap test.** / Every 3 years starting at age 60 years through age 26 or 46 years with 3 consecutive normal Pap tests. Testing can be stopped between 65 and 70 years with 3 consecutive normal Pap tests and no abnormal Pap or HPV tests in the past 10 years.  HPV screening.** / Every 3 years from ages 29 years through ages 71 or 50 years with a history of 3 consecutive normal Pap tests. Testing can be stopped between 65 and 70 years with 3 consecutive normal Pap tests and no abnormal Pap or HPV tests in the past 10 years.  Fecal occult blood test (FOBT) of stool. / Every year beginning at age 3 years and continuing until age 33 years. You may not need to do this test if you get a colonoscopy every 10 years.  Flexible sigmoidoscopy or colonoscopy.** / Every 5 years for a flexible sigmoidoscopy or every 10 years for a colonoscopy beginning at age 58 years and continuing until age 20 years.  Hepatitis C blood test.** / For all people born from [birth year] through 1966 and any individual with known risks for hepatitis C.  Osteoporosis screening.** / A one-time screening for women ages 85 years and over and  women at risk for fractures or osteoporosis.  Skin self-exam. / Monthly.  Influenza vaccine. / Every year.  Tetanus, diphtheria, and acellular pertussis (Tdap/Td) vaccine.** / 1 dose of Td every 10 years.  Varicella vaccine.** / Consult your health care provider.  Zoster vaccine.** / 1 dose for adults aged 36 years or older.  Pneumococcal 13-valent conjugate (PCV13) vaccine.** / Consult your health care provider.  Pneumococcal polysaccharide (PPSV23) vaccine.** / 1 dose for all adults aged 43 years and older.  Meningococcal vaccine.** / Consult your health care provider.  Hepatitis A vaccine.** / Consult your health care provider.  Hepatitis B vaccine.** / Consult your health care provider.  Haemophilus influenzae type b (Hib) vaccine.** / Consult your health care provider. ** Family history and personal history of risk and conditions may change your health care provider's recommendations. Document Released: 12/05/2001 Document Revised: 02/23/2014 Document Reviewed: 03/06/2011 Exit[employer] Patient  Information 2015 [place], [place]. This information is not intended to replace advice given to you by your health care provider. Make sure you discuss any questions you have with your health care provider.

## 2014-12-18 NOTE — Addendum Note (Signed)
Addended by: Rockwell Germany on: 12/18/2014 04:38 PM   Modules accepted: Orders

## 2014-12-18 NOTE — Progress Notes (Signed)
Pre visit review using our clinic review tool, if applicable. No additional management support is needed unless otherwise documented below in the visit note/SLS  

## 2014-12-21 ENCOUNTER — Encounter: Payer: Self-pay | Admitting: *Deleted

## 2014-12-21 LAB — TB SKIN TEST
Induration: 0 mm
TB Skin Test: NEGATIVE

## 2014-12-23 ENCOUNTER — Ambulatory Visit (HOSPITAL_COMMUNITY)
Admission: RE | Admit: 2014-12-23 | Discharge: 2014-12-23 | Disposition: A | Discharge status: Home / Self Care | Payer: 59 | Source: Ambulatory Visit | Attending: Physician Assistant | Admitting: Physician Assistant

## 2014-12-23 DIAGNOSIS — Z1231 Encounter for screening mammogram for malignant neoplasm of breast: Secondary | ICD-10-CM | POA: Insufficient documentation

## 2015-01-12 ENCOUNTER — Ambulatory Visit (INDEPENDENT_AMBULATORY_CARE_PROVIDER_SITE_OTHER): Payer: 59 | Admitting: Physician Assistant

## 2015-01-12 ENCOUNTER — Encounter: Payer: Self-pay | Admitting: Physician Assistant

## 2015-01-12 VITALS — BP 115/72 | HR 63 | Temp 97.7°F | Resp 16 | Ht 67.0 in | Wt 215.0 lb

## 2015-01-12 DIAGNOSIS — E119 Type 2 diabetes mellitus without complications: Secondary | ICD-10-CM | POA: Insufficient documentation

## 2015-01-12 LAB — HEMOGLOBIN A1C: Hgb A1c MFr Bld: 7.3 % — ABNORMAL HIGH (ref 4.6–6.5)

## 2015-01-12 MED ORDER — LOSARTAN POTASSIUM 25 MG PO TABS: 12.5000 mg | ORAL_TABLET | Freq: Every day | ORAL | Status: DC

## 2015-01-12 NOTE — Progress Notes (Signed)
Pre visit review using our clinic review tool, if applicable. No additional management support is needed unless otherwise documented below in the visit note. 

## 2015-01-12 NOTE — Assessment & Plan Note (Signed)
Chronic cough with ACE inhibitor. We'll stop. Start losartan 12.5 mg daily. Continue diabetic regimen for now.  Will change based on A1C obtained today.  Diabetic foot exam performed with evidence of mild neuropathy.  Supportive measures discussed.

## 2015-01-12 NOTE — Patient Instructions (Signed)
Please go to the lab for blood work. I will call you with your results. Start the Losartan as directed. Check BP at home.  Follow-up in 3 months.

## 2015-01-12 NOTE — Progress Notes (Signed)
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  Patient was recently switched from her amlodipine to ramipiril. Endorses a chronic dry cough that is worse 2 hours after taking her medication. Patient also wishes to discuss switching her Bydureon for another diabetes medication.  She is currently on metformin 500 mg twice a day. Is due for repeat A1c.   also due for diabetic foot exam.   Past Medical History  Diagnosis Date  . Thyroid disease   . Hyperlipidemia     Had Gastric Sleeve  . Obesity     Had Gastric Sleeve  . MVP (mitral valve prolapse)     hx. of [>30 yrs]  . Hypothyroidism   . History of kidney stones   . Chicken pox   . Diabetes mellitus     Resolved w/Gastric Sleeve  . GERD (gastroesophageal reflux disease)   . 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 amitriptyline (ELAVIL) 10 MG tablet Take 10 mg by mouth at bedtime.    Marland Kitchen aspirin 81 MG tablet Take 81 mg by mouth daily.    Marland Kitchen BYDUREON 2 MG SUSR Inject 2 mg into the skin once a week. On Sunday  12  . Calcium Carbonate-Vitamin D (CALCIUM-VITAMIN D3 PO) Take 1 each by mouth every other day.    . celecoxib (CELEBREX) 100 MG capsule Take 100 mg by mouth 2 (two) times daily as needed.  12  . levothyroxine (SYNTHROID) 175 MCG tablet Take 1 tablet (175 mcg total) by mouth daily before breakfast. Brand ONLY 90 tablet 1  . metFORMIN (GLUCOPHAGE) 500 MG tablet Take 1 tablet (500 mg total) by mouth 2 (two) times daily with a meal. 60 tablet 2  . pantoprazole (PROTONIX) 40 MG tablet Take 1 tablet (40 mg total) by mouth daily. 90 tablet 3  . rosuvastatin (CRESTOR) 5 MG tablet Take 1 tablet (5 mg total) by mouth daily. 90 tablet 3  . VASCEPA 1 G CAPS TAKE 1 CAPSULE BY MOUTH TWICE DAILY 120 capsule 0   No current facility-administered medications on file prior to  visit.    Allergies  Allergen Reactions  . Codeine Nausea And Vomiting  . Morphine And Related Nausea And Vomiting    Family History  Problem Relation Age of Onset  . Cancer Other   . Ulcerative colitis Other   . Lung cancer Mother   . Colitis Mother   . Heart disease Mother   . Lung cancer Father   . Heart disease Father   . Alzheimer's disease Maternal Grandmother   . Heart attack Maternal Grandfather   . Heart attack Paternal Grandfather   . Breast cancer Maternal Aunt   . Alzheimer's disease Maternal Aunt   . Hypothyroidism Mother   . Stroke Paternal Aunt   . Healthy Sister     x3  . Healthy Daughter     x3  . Graves' disease Son     History   Social History  . Marital Status: Married    Spouse Name: N/A  . Number of Children: N/A  . Years of Education: N/A   Social History Main Topics  . Smoking status: Never Smoker   . Smokeless tobacco: Never Used  . Alcohol Use: No     Comment: rare occ.  . Drug Use: No  .  Sexual Activity: Yes   Other Topics Concern  . None   Social History Narrative   Review of Systems - See HPI.  All other ROS are negative.  BP 115/72 mmHg  Pulse 63  Temp(Src) 97.7 F (36.5 C) (Oral)  Resp 16  Ht $R'5\' 7"'BQ$  (1.702 m)  Wt 215 lb (97.523 kg)  BMI 33.67 kg/m2  SpO2 99%  Physical Exam  Constitutional: She is oriented to person, place, and time and well-developed, well-nourished, and in no distress.  HENT:  Head: Normocephalic and atraumatic.  Cardiovascular: Normal rate.   Pulmonary/Chest: Effort normal and breath sounds normal.  Neurological: She is alert and oriented to person, place, and time.  Skin: Skin is warm. No rash noted.  Vitals reviewed.  Diabetic Foot Form - Detailed   Diabetic Foot Exam - detailed  Diabetic Foot exam was performed with the following findings:  Yes 01/12/2015  7:38 AM  Visual Foot Exam completed.:  Yes  Is there a history of foot ulcer?:  No  Can the patient see the bottom of their feet?:   Yes  Are the shoes appropriate in style and fit?:  Yes  Is there swelling or and abnormal foot shape?:  No  Are the toenails long?:  No  Are the toenails thick?:  No  Do you have pain in calf while walking?:  No  Is there a claw toe deformity?:  No  Is there elevated skin temparature?:  No  Is there limited skin dorsiflexion?:  No  Is there foot or ankle muscle weakness?:  No  Are the toenails ingrown?:  No  Normal Range of Motion:  Yes    Pulse Foot Exam completed.:  Yes  Right posterior Tibialias:  Present Left posterior Tibialias:  Present  Right Dorsalis Pedis:  Present Left Dorsalis Pedis:  Present  Sensory Foot Exam Completed.:  Yes  Semmes-Weinstein Monofilament Test  R Foot Test Control:  Neg L Foot Test Control:  Neg  R Site 1-Great Toe:  Pos L Site 1-Great Toe:  Neg  R Site 4:  Neg L Site 4:  Neg  R Site 5:  Neg L Site 5:  Neg         Recent Results (from the past 2160 hour(s))  Comp Met (CMET)     Status: Abnormal   Collection Time: 11/24/14  8:57 AM  Result Value Ref Range   Sodium 137 135 - 145 mEq/L   Potassium 3.8 3.5 - 5.1 mEq/L   Chloride 103 96 - 112 mEq/L   CO2 30 19 - 32 mEq/L   Glucose, Bld 227 (H) 70 - 99 mg/dL   BUN 12 6 - 23 mg/dL   Creatinine, Ser 0.68 0.40 - 1.20 mg/dL   Total Bilirubin 0.4 0.2 - 1.2 mg/dL   Alkaline Phosphatase 62 39 - 117 U/L   AST 13 0 - 37 U/L   ALT 14 0 - 35 U/L   Total Protein 6.5 6.0 - 8.3 g/dL   Albumin 4.0 3.5 - 5.2 g/dL   Calcium 9.4 8.4 - 10.5 mg/dL   GFR 95.48 >60.00 mL/min  Lipoprotein A (LPA)     Status: None   Collection Time: 11/24/14  8:57 AM  Result Value Ref Range   Lipoprotein (a) 11 0 - 30 mg/dL    Comment:   Several studies indicate that African Americans, in the aggregate, have median Lp(a) values that are two to three times higher than median values in Caucasians. However,  there is no information on whether this imparts significantly increased atherosclerotic risk.     NMR Lipoprofile with  Lipids     Status: Abnormal   Collection Time: 11/24/14  8:57 AM  Result Value Ref Range   LDL Particle Number 693 <1000 nmol/L    Comment:                           Low                   < 1000                           Moderate         1000 - 1299                           Borderline-High  1300 - 1599                           High             1600 - 2000                           Very High             > 2000    LDL (calc) 50 0 - 99 mg/dL    Comment:                           Optimal               <  100                           Above optimal     100 -  129                           Borderline        130 -  159                           High              160 -  189                           Very high             >  189 LDL-C is inaccurate if patient is non-fasting.    HDL-C 68 >39 mg/dL   Triglycerides 174 (H) 0 - 149 mg/dL   Cholesterol, Total 153 100 - 199 mg/dL   HDL Particle Number 38.9 >=30.5 umol/L   Large HDL-P 13.1 >=4.8 umol/L   Large VLDL-P 5.7 (H) <=2.7 nmol/L   Small LDL Particle Number <90 <=527 nmol/L   LDL Size 21.3 >=20.8 nm   HDL Size 10.1 >=9.2 nm   VLDL Size 54.9 (H) <=46.6 nm   LP-IR Score 33 <=45    Comment:  ----------------------------------------------------------              INSULIN RESISTANCE / DIABETES RISK MARKERS            <--Insulin  Sensitive      Insulin Resistant-->                       Percentile in Reference Population   Large VLDL-P      Low     25th     50th     75th     High                     <0.9    0.9      2.7      6.9      >6.9   Small LDL-P       Low     25th     50th     75th     High                     <117    117      527      839      >839   Large HDL-P       High    75th     50th     25th     Low                     >7.3    7.3      4.8      3.1      <3.1   VLDL Size         Small   25th     50th     75th     Large                     <42.4   42.4     46.6     52.5     >52.5   LDL Size          Large   75th     50th      25th     Small                     >21.2   21.2     20.8     20.4     <20.4   HDL Size          Large   75th     50th     25th     Small                     >9.6    9.6      9.2      8.9      <8.9   Insulin Resistance Score    LP-IR SCORE       Low     25th     50th     75th     High                     <27     27       45       63       >63    __________________________________________________________ LP-IR Score is inaccurate if patient is non-fasting. The LP-IR score is a laboratory developed index that has been associated with insulin resistance and diabetes risk and should be used as one component of a physician's clinical assessment. Neither the LP-IR  score nor the subclasses listed above have been cleared by the [place] Food and Drug Administration.   CBC     Status: Abnormal   Collection Time: 12/18/14  8:15 AM  Result Value Ref Range   WBC 5.6 4.0 - 10.5 K/uL   RBC 3.93 3.87 - 5.11 Mil/uL   Platelets 211.0 150.0 - 400.0 K/uL   Hemoglobin 11.6 (L) 12.0 - 15.0 g/dL   HCT 34.0 (L) 36.0 - 46.0 %   MCV 86.6 78.0 - 100.0 fl   MCHC 34.0 30.0 - 36.0 g/dL   RDW 13.0 11.5 - 95.1 %  Basic metabolic panel     Status: Abnormal   Collection Time: 12/18/14  8:15 AM  Result Value Ref Range   Sodium 140 135 - 145 mEq/L   Potassium 4.0 3.5 - 5.1 mEq/L   Chloride 105 96 - 112 mEq/L   CO2 31 19 - 32 mEq/L   Glucose, Bld 163 (H) 70 - 99 mg/dL   BUN 11 6 - 23 mg/dL   Creatinine, Ser 0.69 0.40 - 1.20 mg/dL   Calcium 9.3 8.4 - 10.5 mg/dL   GFR 93.86 >60.00 mL/min  Hepatic function panel     Status: None   Collection Time: 12/18/14  8:15 AM  Result Value Ref Range   Total Bilirubin 0.7 0.2 - 1.2 mg/dL   Bilirubin, Direct 0.2 0.0 - 0.3 mg/dL   Alkaline Phosphatase 51 39 - 117 U/L   AST 14 0 - 37 U/L   ALT 15 0 - 35 U/L   Total Protein 6.4 6.0 - 8.3 g/dL   Albumin 3.9 3.5 - 5.2 g/dL  TSH     Status: None   Collection Time: 12/18/14  8:15 AM  Result Value Ref Range   TSH 0.47 0.35 - 4.50  uIU/mL  Urinalysis, Routine w reflex microscopic     Status: Abnormal   Collection Time: 12/18/14  8:15 AM  Result Value Ref Range   Color, Urine YELLOW Yellow;Lt. Yellow   APPearance CLEAR Clear   Specific Gravity, Urine >=1.030 (A) 1.000 - 1.030   pH 5.5 5.0 - 8.0   Total Protein, Urine NEGATIVE Negative   Urine Glucose NEGATIVE Negative   Ketones, ur NEGATIVE Negative   Bilirubin Urine NEGATIVE Negative   Hgb urine dipstick NEGATIVE Negative   Urobilinogen, UA 0.2 0.0 - 1.0   Leukocytes, UA NEGATIVE Negative   Nitrite NEGATIVE Negative   WBC, UA 0-2/hpf 0-2/hpf   RBC / HPF 0-2/hpf 0-2/hpf   Squamous Epithelial / LPF Rare(0-4/hpf) Rare(0-4/hpf)  Lipid panel     Status: None   Collection Time: 12/18/14  8:15 AM  Result Value Ref Range   Cholesterol 138 0 - 200 mg/dL    Comment: ATP III Classification       Desirable:  < 200 mg/dL               Borderline High:  200 - 239 mg/dL          High:  > = 240 mg/dL   Triglycerides 68.0 0.0 - 149.0 mg/dL    Comment: Normal:  <150 mg/dLBorderline High:  150 - 199 mg/dL   HDL 65.90 >39.00 mg/dL   VLDL 13.6 0.0 - 40.0 mg/dL   LDL Cholesterol 59 0 - 99 mg/dL   Total CHOL/HDL Ratio 2     Comment:                Men  Women1/2 Average Risk     3.4          3.3Average Risk          5.0          4.42X Average Risk          9.6          7.13X Average Risk          15.0          11.0                       NonHDL 72.10     Comment: NOTE:  Non-HDL goal should be 30 mg/dL higher than patient's LDL goal (i.e. LDL goal of < 70 mg/dL, would have non-HDL goal of < 100 mg/dL)  PPD     Status: Normal   Collection Time: 12/21/14  7:31 AM  Result Value Ref Range   TB Skin Test Negative    Induration 0.00 mm    Assessment/Plan: Diabetes mellitus type II, controlled Chronic cough with ACE inhibitor. We'll stop. Start losartan 12.5 mg daily. Continue diabetic regimen for now.  Will change based on A1C obtained today.  Diabetic foot exam performed  with evidence of mild neuropathy.  Supportive measures discussed.

## 2015-02-04 ENCOUNTER — Encounter: Payer: Self-pay | Admitting: Physician Assistant

## 2015-02-10 ENCOUNTER — Other Ambulatory Visit: Payer: Self-pay | Admitting: Physician Assistant

## 2015-04-30 ENCOUNTER — Other Ambulatory Visit: Payer: Self-pay | Admitting: Physician Assistant

## 2015-06-29 ENCOUNTER — Encounter: Payer: Self-pay | Admitting: Physician Assistant

## 2015-06-29 ENCOUNTER — Ambulatory Visit (INDEPENDENT_AMBULATORY_CARE_PROVIDER_SITE_OTHER): Payer: 59 | Admitting: Physician Assistant

## 2015-06-29 VITALS — BP 127/70 | HR 84 | Temp 98.0°F | Resp 16 | Ht 67.0 in | Wt 206.2 lb

## 2015-06-29 DIAGNOSIS — F988 Other specified behavioral and emotional disorders with onset usually occurring in childhood and adolescence: Secondary | ICD-10-CM

## 2015-06-29 DIAGNOSIS — F909 Attention-deficit hyperactivity disorder, unspecified type: Secondary | ICD-10-CM

## 2015-06-29 MED ORDER — AMPHETAMINE-DEXTROAMPHETAMINE 20 MG PO TABS: 20.0000 mg | ORAL_TABLET | Freq: Two times a day (BID) | ORAL | Status: DC

## 2015-06-29 NOTE — Progress Notes (Signed)
Patient presents to clinic today to discuss history of Adult ADD, previously prescribed Adderall by psychiatrist 10 years ago when diagnosis was made. 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 States she was on Adderal with good relief without side effect. 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   Past Medical History  Diagnosis Date  . Thyroid disease   . Hyperlipidemia     Had Gastric Sleeve  . Obesity     Had Gastric Sleeve  . MVP (mitral valve prolapse)     hx. of [>30 yrs]  . Hypothyroidism   . History of kidney stones   . Chicken pox   . Diabetes mellitus     Resolved w/Gastric Sleeve  . GERD (gastroesophageal reflux disease)   . 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 amitriptyline (ELAVIL) 10 MG tablet Take 10 mg by mouth at bedtime.    Marland Kitchen aspirin 81 MG tablet Take 81 mg by mouth daily.    . Calcium Carbonate-Vitamin D (CALCIUM-VITAMIN D3 PO) Take 1 each by mouth every other day.    . celecoxib (CELEBREX) 100 MG capsule Take 100 mg by mouth 2 (two) times daily as needed.  12  . levothyroxine (SYNTHROID) 175 MCG tablet Take 1 tablet (175 mcg total) by mouth daily before breakfast. Brand ONLY 90 tablet 1  . losartan (COZAAR) 25 MG tablet TAKE 1/2 TABLET (12.5 MG TOTAL) BY MOUTH DAILY. 30 tablet 3  . metFORMIN (GLUCOPHAGE) 500 MG tablet TAKE 1 TABLET BY MOUTH TWICE DAILY WITH A MEAL 60 tablet 5  . pantoprazole (PROTONIX) 40 MG tablet Take 1 tablet (40 mg total) by mouth daily. 90 tablet 3  . rosuvastatin (CRESTOR) 5 MG tablet Take 1 tablet (5 mg total) by mouth daily. 90 tablet 3  . VASCEPA 1 G CAPS TAKE 1 CAPSULE BY MOUTH TWICE DAILY 120 capsule 1   No current facility-administered medications on file prior to  visit.    Allergies  Allergen Reactions  . Codeine Nausea And Vomiting  . Morphine And Related Nausea And Vomiting    Family History  Problem Relation Age of Onset  . Cancer Other   . Ulcerative colitis Other   . Lung cancer Mother   . Colitis Mother   . Heart disease Mother   . Lung cancer Father   . Heart disease Father   . Alzheimer's disease Maternal Grandmother   . Heart attack Maternal Grandfather   . Heart attack Paternal Grandfather   . Breast cancer Maternal Aunt   . Alzheimer's disease Maternal Aunt   . Hypothyroidism Mother   . Stroke Paternal Aunt   . Healthy Sister     x3  . Healthy Daughter     x3  . Graves' disease Son     Social History   Social History  . Marital Status: Married    Spouse Name: N/A  . Number of Children: N/A  . Years of Education: N/A   Social History Main Topics  . Smoking status: Never Smoker   . Smokeless tobacco: Never Used  . Alcohol Use: No     Comment: rare occ.  . Drug Use: No  . Sexual Activity: Yes  Other Topics Concern  . None   Social History Narrative   Review of Systems - See HPI.  All other ROS are negative.  BP 127/70 mmHg  Pulse 84  Temp(Src) 98 F (36.7 C) (Oral)  Resp 16  Ht 5\' 7"  (1.702 m)  Wt 206 lb 4 oz (93.554 kg)  BMI 32.30 kg/m2  SpO2 100%  Physical Exam  Constitutional: She is oriented to person, place, and time and well-developed, well-nourished, and in no distress.  HENT:  Head: Normocephalic and atraumatic.  Eyes: Conjunctivae are normal.  Cardiovascular: Normal rate, regular rhythm, normal heart sounds and intact distal pulses.   Pulmonary/Chest: Effort normal.  Neurological: She is alert and oriented to person, place, and time.  Skin: Skin is warm and dry. No rash noted.  Psychiatric: Affect normal.  Vitals reviewed.  Assessment/Plan: ADD (attention deficit disorder) Long-standing history. Controlled on its own usually due to patient's intelligence as this compensates for  attention issues. 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 CSC signed. Will start Adderall 10 mg BID and titrate to 20 mg BID if needed. Follow-up 1 month.

## 2015-06-29 NOTE — Patient Instructions (Signed)
Please start Adderall 1/2 tablet twice daily for 1 week. Then increase to 1 tablet twice daily.  Follow-up in 1 month. Return sooner if needed.

## 2015-06-29 NOTE — Assessment & Plan Note (Signed)
Long-standing history. Controlled on its own usually due to patient's intelligence as this compensates for attention issues. 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 CSC signed. Will start Adderall 10 mg BID and titrate to 20 mg BID if needed. Follow-up 1 month.

## 2015-07-20 ENCOUNTER — Other Ambulatory Visit: Payer: Self-pay | Admitting: *Deleted

## 2015-07-20 MED ORDER — CELECOXIB 100 MG PO CAPS: 100.0000 mg | ORAL_CAPSULE | Freq: Two times a day (BID) | ORAL | Status: DC | PRN

## 2015-07-20 NOTE — Telephone Encounter (Signed)
Refill for Celebrex to pharmacy, [facility] with contact name and number for return call RE: Amitriptyline per VO provider, as this has not been previously px by PCP/SLS

## 2015-07-21 MED ORDER — PANTOPRAZOLE SODIUM 40 MG PO TBEC: 40.0000 mg | DELAYED_RELEASE_TABLET | Freq: Every day | ORAL | Status: DC

## 2015-07-21 MED ORDER — AMITRIPTYLINE HCL 10 MG PO TABS: 10.0000 mg | ORAL_TABLET | Freq: Every day | ORAL | Status: DC

## 2015-07-21 NOTE — Telephone Encounter (Signed)
Refill sent per [facility] refill protocol; Ok per VO providerl/SLS

## 2015-07-27 ENCOUNTER — Telehealth: Payer: Self-pay | Admitting: Physician Assistant

## 2015-07-27 NOTE — Telephone Encounter (Signed)
Pt called in stating she received bill for $25.00 copay. She is a [employer] and she is not supposed to have a copay. Please correct and notify pt.

## 2015-07-28 ENCOUNTER — Ambulatory Visit (INDEPENDENT_AMBULATORY_CARE_PROVIDER_SITE_OTHER): Payer: 59 | Admitting: Physician Assistant

## 2015-07-28 ENCOUNTER — Encounter: Payer: Self-pay | Admitting: Physician Assistant

## 2015-07-28 VITALS — BP 118/59 | HR 76 | Temp 97.9°F | Resp 16 | Ht 67.0 in | Wt 208.0 lb

## 2015-07-28 DIAGNOSIS — F988 Other specified behavioral and emotional disorders with onset usually occurring in childhood and adolescence: Secondary | ICD-10-CM

## 2015-07-28 DIAGNOSIS — E119 Type 2 diabetes mellitus without complications: Secondary | ICD-10-CM | POA: Diagnosis not present

## 2015-07-28 DIAGNOSIS — F909 Attention-deficit hyperactivity disorder, unspecified type: Secondary | ICD-10-CM | POA: Diagnosis not present

## 2015-07-28 MED ORDER — METFORMIN HCL 500 MG PO TABS: ORAL_TABLET | ORAL | Status: DC

## 2015-07-28 MED ORDER — RAMIPRIL 2.5 MG PO CAPS: 2.5000 mg | ORAL_CAPSULE | Freq: Every day | ORAL | Status: DC

## 2015-07-28 MED ORDER — AMPHETAMINE-DEXTROAMPHETAMINE 20 MG PO TABS: 20.0000 mg | ORAL_TABLET | Freq: Two times a day (BID) | ORAL | Status: DC

## 2015-07-28 MED ORDER — CALCIUM-VITAMIN D3 600-400 MG-UNIT PO CAPS: ORAL_CAPSULE | ORAL | Status: DC

## 2015-07-28 MED ORDER — PANTOPRAZOLE SODIUM 40 MG PO TBEC: 40.0000 mg | DELAYED_RELEASE_TABLET | Freq: Every day | ORAL | Status: DC

## 2015-07-28 NOTE — Progress Notes (Signed)
Pre visit review using our clinic review tool, if applicable. No additional management support is needed unless otherwise documented below in the visit note/SLS  

## 2015-07-28 NOTE — Patient Instructions (Signed)
Please continue medications as directed with the following exceptions: (1) Stop the losartan and start the Ramipril daily as directed. (2) Increase PM dose of Metformin to 1000 mg (2 tablets)  Follow-up in 3 months.

## 2015-07-28 NOTE — Progress Notes (Signed)
Patient presents to clinic today for follow-up of ADD. Is taking Adderall 10 mg BID most days but only once daily if just working. Does not take on weekends. Endorses improvement in focus and attention.  Patient currently on losartan 12.5 mg daily. Is noting low BP at home and mild lightheadedness. Denies chest pain, palpitations or SOB.    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 Is taking medication as directed but starting to note morning sugars increasing above goal.  Past Medical History  Diagnosis Date  . Thyroid disease   . Hyperlipidemia     Had Gastric Sleeve  . Obesity     Had Gastric Sleeve  . MVP (mitral valve prolapse)     hx. of [>30 yrs]  . Hypothyroidism   . History of kidney stones   . Chicken pox   . Diabetes mellitus     Resolved w/Gastric Sleeve  . GERD (gastroesophageal reflux disease)   . 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 amitriptyline (ELAVIL) 10 MG tablet Take 1 tablet (10 mg total) by mouth at bedtime. 90 tablet 0  . aspirin 81 MG tablet Take 81 mg by mouth daily.    . celecoxib (CELEBREX) 100 MG capsule Take 1 capsule (100 mg total) by mouth 2 (two) times daily as needed. 60 capsule 1  . levothyroxine (SYNTHROID) 175 MCG tablet Take 1 tablet (175 mcg total) by mouth daily before breakfast. Brand ONLY 90 tablet 1  . rosuvastatin (CRESTOR) 5 MG tablet Take 1 tablet (5 mg total) by mouth daily. 90 tablet 3  . VASCEPA 1 G CAPS TAKE 1 CAPSULE BY MOUTH TWICE DAILY 120 capsule 1   No current facility-administered medications on file prior to visit.    Allergies  Allergen Reactions  . Codeine Nausea And Vomiting  . Morphine And Related Nausea And Vomiting    Family History  Problem Relation Age of Onset  . Cancer Other   . Ulcerative colitis Other   . Lung cancer Mother   . Colitis Mother   . Heart disease Mother   . Lung  cancer Father   . Heart disease Father   . Alzheimer's disease Maternal Grandmother   . Heart attack Maternal Grandfather   . Heart attack Paternal Grandfather   . Breast cancer Maternal Aunt   . Alzheimer's disease Maternal Aunt   . Hypothyroidism Mother   . Stroke Paternal Aunt   . Healthy Sister     x3  . Healthy Daughter     x3  . Graves' disease Son     Social History   Social History  . Marital Status: Married    Spouse Name: N/A  . Number of Children: N/A  . Years of Education: N/A   Social History Main Topics  . Smoking status: Never Smoker   . Smokeless tobacco: Never Used  . Alcohol Use: No     Comment: rare occ.  . Drug Use: No  . Sexual Activity: Yes   Other Topics Concern  . None   Social History Narrative   Review of Systems - See HPI.  All other ROS are negative.  BP 118/59 mmHg  Pulse 76  Temp(Src) 97.9 F (36.6 C) (Oral)  Resp 16  Ht 5\' 7"  (1.702 m)  Wt 208 lb (94.348 kg)  BMI  32.57 kg/m2  SpO2 100%  Physical Exam  Constitutional: She is oriented to person, place, and time and well-developed, well-nourished, and in no distress.  HENT:  Head: Normocephalic and atraumatic.  Cardiovascular: Normal rate, regular rhythm, normal heart sounds and intact distal pulses.   Pulmonary/Chest: Effort normal and breath sounds normal. No respiratory distress. She has no wheezes. She has no rales. She exhibits no tenderness.  Neurological: She is alert and oriented to person, place, and time.  Skin: Skin is warm and dry. No rash noted.  Psychiatric: Affect normal.  Vitals reviewed.   No results found for this or any previous visit (from the past 2160 hour(s)).  Assessment/Plan: Diabetes mellitus type II, controlled Will increase PM Metformin to 1000 to keep glycemic control. Dietary and exercise measures discussed. BP normotensive without BP medication. Will stop losartan and begin ramipril 2.5 for renovascular protection. Follow-up in 3 months.  ADD  (attention deficit disorder) Doing well. Will continue current regimen.

## 2015-07-28 NOTE — Assessment & Plan Note (Signed)
Doing well. Will continue current regimen.  

## 2015-07-28 NOTE — Assessment & Plan Note (Addendum)
Will increase PM Metformin to 1000 to keep glycemic control. Dietary and exercise measures discussed. BP normotensive without BP medication. Will stop losartan and begin ramipril 2.5 for renovascular protection. Follow-up in 3 months.

## 2015-08-23 LAB — HEMOGLOBIN A1C: Hgb A1c MFr Bld: 7 % — AB (ref 4.0–6.0)

## 2015-08-24 ENCOUNTER — Ambulatory Visit (INDEPENDENT_AMBULATORY_CARE_PROVIDER_SITE_OTHER): Payer: 59 | Admitting: Physician Assistant

## 2015-08-24 ENCOUNTER — Encounter: Payer: Self-pay | Admitting: Physician Assistant

## 2015-08-24 VITALS — BP 111/67 | HR 72 | Temp 97.9°F | Resp 16 | Ht 67.0 in | Wt 202.2 lb

## 2015-08-24 DIAGNOSIS — R252 Cramp and spasm: Secondary | ICD-10-CM | POA: Diagnosis not present

## 2015-08-24 DIAGNOSIS — E119 Type 2 diabetes mellitus without complications: Secondary | ICD-10-CM

## 2015-08-24 DIAGNOSIS — M5090 Cervical disc disorder, unspecified, unspecified cervical region: Secondary | ICD-10-CM

## 2015-08-24 LAB — BASIC METABOLIC PANEL
BUN: 12 mg/dL (ref 6–23)
CO2: 28 meq/L (ref 19–32)
Calcium: 9.6 mg/dL (ref 8.4–10.5)
Chloride: 102 mEq/L (ref 96–112)
Creatinine, Ser: 0.76 mg/dL (ref 0.40–1.20)
GFR: 83.75 mL/min (ref >60.00)
GLUCOSE: 138 mg/dL — AB (ref 70–99)
POTASSIUM: 3.8 meq/L (ref 3.5–5.1)
Sodium: 138 mEq/L (ref 135–145)

## 2015-08-24 LAB — VITAMIN B12: Vitamin B-12: 186 pg/mL — ABNORMAL LOW (ref 211–911)

## 2015-08-24 MED ORDER — AMPHETAMINE-DEXTROAMPHETAMINE 20 MG PO TABS: 20.0000 mg | ORAL_TABLET | Freq: Two times a day (BID) | ORAL | Status: DC

## 2015-08-24 MED ORDER — METHYLPREDNISOLONE 4 MG PO TBPK: ORAL_TABLET | ORAL | Status: DC

## 2015-08-24 MED ORDER — EXENATIDE ER 2 MG ~~LOC~~ PEN: PEN_INJECTOR | SUBCUTANEOUS | Status: DC

## 2015-08-24 NOTE — Assessment & Plan Note (Signed)
Noted by patient as afterthought at end of visit. Will check electrolytes and vitamin levels today.

## 2015-08-24 NOTE — Progress Notes (Signed)
Patient presents to clinic today c/o R sided neck pain with radiation into shoulder x 2 weeks after an injury at work on 08/16/15 after a large patient pulled on her right arm when she was helping to move him. Endorses pain is pulling and tight in nature but becomes sharp if she abducts her arm > 90 degrees. Denies weakness or numbness. Endorses some tingling in RUE. Has history of cervical disc disease.  Patient also wishing to restart her Bydureon to help optimize A1C. Sugars have been stable with last A1C at 7.0. No signs of diabetes complications.   Past Medical History  Diagnosis Date  . Thyroid disease   . Hyperlipidemia     Had Gastric Sleeve  . Obesity     Had Gastric Sleeve  . MVP (mitral valve prolapse)     hx. of [>30 yrs]  . Hypothyroidism   . History of kidney stones   . Chicken pox   . Diabetes mellitus     Resolved w/Gastric Sleeve  . GERD (gastroesophageal reflux disease)   . 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 amitriptyline (ELAVIL) 10 MG tablet Take 1 tablet (10 mg total) by mouth at bedtime. 90 tablet 0  . aspirin 81 MG tablet Take 81 mg by mouth daily.    . celecoxib (CELEBREX) 100 MG capsule Take 1 capsule (100 mg total) by mouth 2 (two) times daily as needed. 60 capsule 1  . levothyroxine (SYNTHROID) 175 MCG tablet Take 1 tablet (175 mcg total) by mouth daily before breakfast. Brand ONLY 90 tablet 1  . metFORMIN (GLUCOPHAGE) 500 MG tablet Take 1 tablet each AM and 2 tablets each PM 90 tablet 5  . pantoprazole (PROTONIX) 40 MG tablet Take 1 tablet (40 mg total) by mouth daily. 90 tablet 1  . ramipril (ALTACE) 2.5 MG capsule Take 1 capsule (2.5 mg total) by mouth daily. 30 capsule 3  . rosuvastatin (CRESTOR) 5 MG tablet Take 1 tablet (5 mg total) by mouth daily. 90 tablet 3  . VASCEPA 1 G CAPS TAKE 1 CAPSULE BY MOUTH TWICE DAILY 120 capsule 1  . Calcium Carb-Cholecalciferol  (CALCIUM-VITAMIN D3) 600-400 MG-UNIT CAPS Take 2 tablets daily as directed (Patient not taking: Reported on 08/24/2015) 60 capsule 5   No current facility-administered medications on file prior to visit.    Allergies  Allergen Reactions  . Codeine Nausea And Vomiting  . Morphine And Related Nausea And Vomiting    Family History  Problem Relation Age of Onset  . Cancer Other   . Ulcerative colitis Other   . Lung cancer Mother   . Colitis Mother   . Heart disease Mother   . Lung cancer Father   . Heart disease Father   . Alzheimer's disease Maternal Grandmother   . Heart attack Maternal Grandfather   . Heart attack Paternal Grandfather   . Breast cancer Maternal Aunt   . Alzheimer's disease Maternal Aunt   . Hypothyroidism Mother   . Stroke Paternal Aunt   . Healthy Sister     x3  . Healthy Daughter     x3  . Graves' disease Son     Social History   Social History  . Marital Status: Married    Spouse Name: N/A  . Number of Children: N/A  . Years of Education: N/A   Social History Main Topics  .  Smoking status: Never Smoker   . Smokeless tobacco: Never Used  . Alcohol Use: No     Comment: rare occ.  . Drug Use: No  . Sexual Activity: Yes   Other Topics Concern  . None   Social History Narrative   Review of Systems - See HPI.  All other ROS are negative.  BP 111/67 mmHg  Pulse 72  Temp(Src) 97.9 F (36.6 C) (Oral)  Resp 16  Ht 5\' 7"  (1.702 m)  Wt 202 lb 4 oz (91.74 kg)  BMI 31.67 kg/m2  SpO2 100%  Physical Exam  Constitutional: She is oriented to person, place, and time and well-developed, well-nourished, and in no distress.  HENT:  Head: Normocephalic and atraumatic.  Neck: Normal range of motion. Muscular tenderness present. No spinous process tenderness present. No rigidity. Normal range of motion present.  Cardiovascular: Normal rate, regular rhythm, normal heart sounds and intact distal pulses.   Pulmonary/Chest: Effort normal and breath sounds  normal. No respiratory distress. She has no wheezes. She has no rales. She exhibits no tenderness.  Musculoskeletal:       Right shoulder: She exhibits tenderness and pain. She exhibits normal range of motion and normal strength.       Left shoulder: Normal.       Cervical back: She exhibits tenderness and pain. She exhibits no bony tenderness.       Thoracic back: Normal.  Neurological: She is alert and oriented to person, place, and time.  Skin: Skin is warm and dry. No rash noted.  Psychiatric: Affect normal.  Vitals reviewed.   No results found for this or any previous visit (from the past 2160 hour(s)).  Assessment/Plan: Muscle cramps Noted by patient as afterthought at end of visit. Will check electrolytes and vitamin levels today.  Diabetes mellitus type II, controlled Will allow Bydureon once weekly as patient is faithful in checking sugars daily. Will follow-up 3 months.  Cervical neck pain with evidence of disc disease With mild rotator cuff tendinopathy. Rx Medrol pack. ES Tylenol for breakthrough pain. Supportive measures reviewed including RICE. Follow-up if symptoms are not resolving.

## 2015-08-24 NOTE — Assessment & Plan Note (Signed)
With mild rotator cuff tendinopathy. Rx Medrol pack. ES Tylenol for breakthrough pain. Supportive measures reviewed including RICE. Follow-up if symptoms are not resolving.

## 2015-08-24 NOTE — Patient Instructions (Signed)
Please go to the lab for blood work. I will call you with your results. Please continue medications as directed. Take the steroid pack as directed. Limit heavy lifting. Alternate ice/heat to the neck and shoulder.  No tennis until you are feeling better!  Please start the Bydureon once weekly as directed.  Follow-up if symptoms are not resolving.

## 2015-08-24 NOTE — Assessment & Plan Note (Signed)
Will allow Bydureon once weekly as patient is faithful in checking sugars daily. Will follow-up 3 months.

## 2015-08-24 NOTE — Progress Notes (Signed)
Pre visit review using our clinic review tool, if applicable. No additional management support is needed unless otherwise documented below in the visit note/SLS  

## 2015-08-25 ENCOUNTER — Telehealth: Payer: Self-pay

## 2015-08-25 NOTE — Telephone Encounter (Signed)
Note Copy & Pasted in to Result note/SLS

## 2015-08-25 NOTE — Telephone Encounter (Signed)
Returning your phone call regarding lab results/ Please call cell phone back [phone]

## 2015-08-27 ENCOUNTER — Other Ambulatory Visit: Payer: Self-pay | Admitting: Physician Assistant

## 2015-08-27 MED ORDER — AMITRIPTYLINE HCL 10 MG PO TABS: 10.0000 mg | ORAL_TABLET | Freq: Every day | ORAL | Status: DC

## 2015-08-28 LAB — VITAMIN D 1,25 DIHYDROXY
VITAMIN D 1, 25 (OH) TOTAL: 58 pg/mL (ref 18–72)
VITAMIN D2 1, 25 (OH): 18 pg/mL
Vitamin D3 1, 25 (OH)2: 40 pg/mL

## 2015-09-01 ENCOUNTER — Encounter: Payer: Self-pay | Admitting: Physician Assistant

## 2015-09-06 ENCOUNTER — Other Ambulatory Visit: Payer: Self-pay | Admitting: Physician Assistant

## 2015-09-06 MED ORDER — AMITRIPTYLINE HCL 10 MG PO TABS: ORAL_TABLET | ORAL | Status: DC

## 2015-09-28 ENCOUNTER — Other Ambulatory Visit: Payer: Self-pay | Admitting: Physician Assistant

## 2015-09-29 ENCOUNTER — Other Ambulatory Visit: Payer: Self-pay | Admitting: *Deleted

## 2015-09-29 DIAGNOSIS — E785 Hyperlipidemia, unspecified: Secondary | ICD-10-CM

## 2015-09-29 MED ORDER — AMPHETAMINE-DEXTROAMPHETAMINE 20 MG PO TABS: 20.0000 mg | ORAL_TABLET | Freq: Two times a day (BID) | ORAL | Status: DC

## 2015-09-29 MED ORDER — ROSUVASTATIN CALCIUM 5 MG PO TABS: 5.0000 mg | ORAL_TABLET | Freq: Every day | ORAL | Status: DC

## 2015-09-29 NOTE — Telephone Encounter (Signed)
Rx sent to the pharmacy by e-script.//AB/CMA 

## 2015-10-29 ENCOUNTER — Other Ambulatory Visit: Payer: Self-pay | Admitting: Physician Assistant

## 2015-10-29 MED FILL — VASCEPA 1 GM CAPSULE: 1 | 60 days supply | Qty: 120 | Fill #0

## 2015-11-03 ENCOUNTER — Other Ambulatory Visit: Payer: Self-pay | Admitting: Physician Assistant

## 2015-11-03 MED FILL — AMPHETAMINE SALTS 20 MG TAB: 20 | 30 days supply | Qty: 60 | Fill #0

## 2015-11-03 MED FILL — AMITRIPTYLINE HCL 10 MG TAB: 10 | 30 days supply | Qty: 90 | Fill #1

## 2015-11-03 MED FILL — metFORMIN HCL 500 MG TABS: 500 | 90 days supply | Qty: 270 | Fill #1

## 2015-11-04 MED FILL — SYNTHROID 175 MCG TABLET: 175 | 90 days supply | Qty: 90 | Fill #0

## 2015-11-10 MED FILL — FLUCONAZOLE 150 MG TABLET: 150 | 3 days supply | Qty: 3 | Fill #0

## 2015-11-10 MED FILL — CYANOCOBALAMIN 1,000 MCG/ML: 1000 | 90 days supply | Qty: 10 | Fill #0

## 2015-11-18 MED FILL — BYDUREON 2 MG PEN INJECT: 2 | 28 days supply | Qty: 4 | Fill #3

## 2015-12-08 ENCOUNTER — Other Ambulatory Visit: Payer: Self-pay | Admitting: Physician Assistant

## 2015-12-08 MED ORDER — AMPHETAMINE-DEXTROAMPHETAMINE 20 MG PO TABS: 20.0000 mg | ORAL_TABLET | Freq: Two times a day (BID) | ORAL | Status: DC

## 2015-12-08 MED ORDER — PANTOPRAZOLE SODIUM 40 MG PO TBEC: 40.0000 mg | DELAYED_RELEASE_TABLET | Freq: Every day | ORAL | Status: DC

## 2015-12-08 MED FILL — AMPHETAMINE SALTS 20 MG TAB: 20 | 30 days supply | Qty: 60 | Fill #0

## 2015-12-08 MED FILL — PANTOPRAZOLE SOD DR 40 MG T: 40 | 90 days supply | Qty: 90 | Fill #0

## 2015-12-15 ENCOUNTER — Other Ambulatory Visit: Payer: Self-pay | Admitting: Physician Assistant

## 2015-12-15 MED FILL — BYDUREON 2 MG PEN INJECT: 2 | 28 days supply | Qty: 4 | Fill #0

## 2015-12-15 MED FILL — AMITRIPTYLINE HCL 10 MG TAB: 10 | 30 days supply | Qty: 90 | Fill #2

## 2016-01-10 ENCOUNTER — Telehealth: Payer: Self-pay | Admitting: Physician Assistant

## 2016-01-10 ENCOUNTER — Ambulatory Visit: Payer: 59 | Admitting: Physician Assistant

## 2016-01-10 NOTE — Telephone Encounter (Signed)
No charge. Thank you 

## 2016-01-10 NOTE — Telephone Encounter (Signed)
Pt sent mychart msg late last night to notify us that she wanted to change appt to this morning because she had something come up and not able to come this afternoon. It was accidentally sent as a mychart appt request so it had not been seen. Charge or no charge?

## 2016-01-10 NOTE — Telephone Encounter (Signed)
Pt aware.//AB/CMA 

## 2016-01-10 NOTE — Telephone Encounter (Signed)
Patient called to inform PCP that she will not be here for her appt today at 1:15. Did not state why, was attempting to cancel the appt on MyChart and could not.

## 2016-01-11 ENCOUNTER — Other Ambulatory Visit: Payer: Self-pay | Admitting: Physician Assistant

## 2016-01-11 MED ORDER — AMPHETAMINE-DEXTROAMPHETAMINE 20 MG PO TABS: 20.0000 mg | ORAL_TABLET | Freq: Two times a day (BID) | ORAL | Status: DC

## 2016-01-11 MED FILL — AMPHETAMINE SALTS 20 MG TAB: 20 | 30 days supply | Qty: 60 | Fill #0

## 2016-01-21 ENCOUNTER — Other Ambulatory Visit: Payer: Self-pay | Admitting: Physician Assistant

## 2016-01-21 MED FILL — VASCEPA 1 GM CAPSULE: 1 | 60 days supply | Qty: 120 | Fill #1

## 2016-01-24 ENCOUNTER — Encounter: Payer: Self-pay | Admitting: Physician Assistant

## 2016-01-24 ENCOUNTER — Ambulatory Visit (INDEPENDENT_AMBULATORY_CARE_PROVIDER_SITE_OTHER): Payer: 59 | Admitting: Physician Assistant

## 2016-01-24 VITALS — BP 108/62 | HR 66 | Temp 97.5°F | Ht 67.0 in | Wt 189.8 lb

## 2016-01-24 DIAGNOSIS — F909 Attention-deficit hyperactivity disorder, unspecified type: Secondary | ICD-10-CM | POA: Diagnosis not present

## 2016-01-24 DIAGNOSIS — F988 Other specified behavioral and emotional disorders with onset usually occurring in childhood and adolescence: Secondary | ICD-10-CM

## 2016-01-24 DIAGNOSIS — E119 Type 2 diabetes mellitus without complications: Secondary | ICD-10-CM

## 2016-01-24 DIAGNOSIS — E038 Other specified hypothyroidism: Secondary | ICD-10-CM | POA: Diagnosis not present

## 2016-01-24 LAB — BASIC METABOLIC PANEL
BUN: 12 mg/dL (ref 6–23)
CALCIUM: 9.9 mg/dL (ref 8.4–10.5)
CHLORIDE: 104 meq/L (ref 96–112)
CO2: 29 meq/L (ref 19–32)
CREATININE: 0.78 mg/dL (ref 0.40–1.20)
GFR: 81.15 mL/min (ref >60.00)
Glucose, Bld: 118 mg/dL — ABNORMAL HIGH (ref 70–99)
Potassium: 4.2 mEq/L (ref 3.5–5.1)
Sodium: 140 mEq/L (ref 135–145)

## 2016-01-24 LAB — TSH: TSH: 1.41 u[IU]/mL (ref 0.35–4.50)

## 2016-01-24 LAB — HEMOGLOBIN A1C: Hgb A1c MFr Bld: 6.6 % — ABNORMAL HIGH (ref 4.6–6.5)

## 2016-01-24 MED ORDER — AMPHETAMINE-DEXTROAMPHETAMINE 20 MG PO TABS: 20.0000 mg | ORAL_TABLET | Freq: Two times a day (BID) | ORAL | Status: DC

## 2016-01-24 MED ORDER — CELECOXIB 100 MG PO CAPS: 100.0000 mg | ORAL_CAPSULE | Freq: Two times a day (BID) | ORAL | Status: DC

## 2016-01-24 MED ORDER — METFORMIN HCL 500 MG PO TABS: ORAL_TABLET | ORAL | Status: DC

## 2016-01-24 MED FILL — CELECOXIB 100 MG CAPSULE: 100 | 30 days supply | Qty: 60 | Fill #0

## 2016-01-24 MED FILL — metFORMIN HCL 500 MG TABS: 500 | 30 days supply | Qty: 90 | Fill #0

## 2016-01-24 NOTE — Assessment & Plan Note (Signed)
Will obtain repeat TSH level today.

## 2016-01-24 NOTE — Assessment & Plan Note (Signed)
Will repeat BMP and A1C today. Patient endorses recent normal eye exam. Will request records. Foot exam updated today with mild decreased sensation in left 5th toe. No other abnormal sensation noted. Will continue current regimen. Will alter based on results.

## 2016-01-24 NOTE — Assessment & Plan Note (Signed)
Vitals stable. Doing well. Medications refilled.

## 2016-01-24 NOTE — Progress Notes (Signed)
Pre visit review using our clinic review tool, if applicable. No additional management support is needed unless otherwise documented below in the visit note. 

## 2016-01-24 NOTE — Progress Notes (Signed)
Patient presents to clinic today for follow-up of DM II. Patient is currently on Metformin 500 mg AM and 1000 mg PM. Is also using Bydureon as directed. Endorses fasting sugars averaging around 100-110. Is playing tennis and exercising daily. Last A1C on 08/23/15 at 7.0. Last Ophthalmology examination on 09/2015 and normal per patient.   Is also due for repeat TSH level due to hypothyroidism. Is taking medications as directed.  Past Medical History  Diagnosis Date  . Thyroid disease   . Hyperlipidemia     Had Gastric Sleeve  . Obesity     Had Gastric Sleeve  . MVP (mitral valve prolapse)     hx. of [>30 yrs]  . Hypothyroidism   . History of kidney stones   . Chicken pox   . Diabetes mellitus     Resolved w/Gastric Sleeve  . GERD (gastroesophageal reflux disease)   . 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 amitriptyline (ELAVIL) 10 MG tablet Take 1-3 tablets at bedtime PRN 270 tablet 0  . aspirin 81 MG tablet Take 81 mg by mouth daily.    Marland Kitchen BYDUREON 2 MG PEN INJECT 2 MG ONCE WEEKLY 4 each 3  . Calcium Carb-Cholecalciferol (CALCIUM-VITAMIN D3) 600-400 MG-UNIT CAPS Take 2 tablets daily as directed 60 capsule 5  . pantoprazole (PROTONIX) 40 MG tablet Take 1 tablet (40 mg total) by mouth daily. 90 tablet 1  . SYNTHROID 175 MCG tablet TAKE 1 TABLET (175 MCG) BY MOUTH DAILY BEFORE BREAKFAST 90 tablet 1  . VASCEPA 1 g CAPS TAKE 1 CAPSULE BY MOUTH TWICE DAILY 120 capsule 1   No current facility-administered medications on file prior to visit.    Allergies  Allergen Reactions  . Codeine Nausea And Vomiting  . Morphine And Related Nausea And Vomiting    Family History  Problem Relation Age of Onset  . Cancer Other   . Ulcerative colitis Other   . Lung cancer Mother   . Colitis Mother   . Heart disease Mother   . Lung cancer Father   . Heart disease Father   . Alzheimer's disease Maternal  Grandmother   . Heart attack Maternal Grandfather   . Heart attack Paternal Grandfather   . Breast cancer Maternal Aunt   . Alzheimer's disease Maternal Aunt   . Hypothyroidism Mother   . Stroke Paternal Aunt   . Healthy Sister     x3  . Healthy Daughter     x3  . Graves' disease Son     Social History   Social History  . Marital Status: Married    Spouse Name: N/A  . Number of Children: N/A  . Years of Education: N/A   Social History Main Topics  . Smoking status: Never Smoker   . Smokeless tobacco: Never Used  . Alcohol Use: No     Comment: rare occ.  . Drug Use: No  . Sexual Activity: Yes   Other Topics Concern  . None   Social History Narrative   Review of Systems - See HPI.  All other ROS are negative.  BP 108/62 mmHg  Pulse 66  Temp(Src) 97.5 F (36.4 C) (Oral)  Ht 5\' 7"  (1.702 m)  Wt 189 lb 12.8 oz (86.093 kg)  BMI 29.72 kg/m2  SpO2 95%  Physical Exam  Constitutional: She is oriented to person, place, and time and well-developed,  well-nourished, and in no distress.  HENT:  Head: Normocephalic and atraumatic.  Eyes: Conjunctivae are normal.  Cardiovascular: Normal rate, regular rhythm, normal heart sounds and intact distal pulses.   Pulmonary/Chest: Effort normal and breath sounds normal. No respiratory distress. She has no wheezes. She has no rales. She exhibits no tenderness.  Neurological: She is alert and oriented to person, place, and time.  Vitals reviewed.  Diabetic Foot Form - Detailed   Diabetic Foot Exam - detailed  Diabetic Foot exam was performed with the following findings:  Yes 01/24/2016  9:01 AM  Visual Foot Exam completed.:  Yes  Is there a history of foot ulcer?:  No  Can the patient see the bottom of their feet?:  Yes  Are the shoes appropriate in style and fit?:  Yes  Is there swelling or and abnormal foot shape?:  No  Are the toenails long?:  No  Are the toenails thick?:  No  Do you have pain in calf while walking?:  No  Is  there a claw toe deformity?:  No  Is there elevated skin temparature?:  No  Is there limited skin dorsiflexion?:  No  Is there foot or ankle muscle weakness?:  No  Are the toenails ingrown?:  No  Normal Range of Motion:  Yes    Pulse Foot Exam completed.:  Yes  Right posterior Tibialias:  Present Left posterior Tibialias:  Present  Right Dorsalis Pedis:  Present Left Dorsalis Pedis:  Present  Semmes-Weinstein Monofilament Test  R Foot Test Control:  Neg L Foot Test Control:  Neg  R Site 1-Great Toe:  Neg L Site 1-Great Toe:  Neg  R Site 4:  Neg L Site 4:  Neg  R Site 5:  Pos L Site 5:  Neg        Assessment/Plan: Hypothyroidism Will obtain repeat TSH level today.  Diabetes mellitus type II, controlled Will repeat BMP and A1C today. Patient endorses recent normal eye exam. Will request records. Foot exam updated today with mild decreased sensation in left 5th toe. No other abnormal sensation noted. Will continue current regimen. Will alter based on results.  ADD (attention deficit disorder) Vitals stable. Doing well. Medications refilled.

## 2016-01-24 NOTE — Patient Instructions (Signed)
Please go to the lab for blood work. I will call you with your results. Please continue medications as directed. Increase fluids and stay active.   Follow-up with me in 6 months.

## 2016-01-25 MED FILL — IBUPROFEN 800 MG TABLET: 800 | 5 days supply | Qty: 20 | Fill #0

## 2016-01-25 MED FILL — CHLORHEXIDINE 0.12% RINSE: 0.12 | 30 days supply | Qty: 473 | Fill #0

## 2016-01-25 MED FILL — DOXYCYCLINE HYC 100 MG CAP: 100 | 4 days supply | Qty: 4 | Fill #0

## 2016-02-02 ENCOUNTER — Other Ambulatory Visit: Payer: Self-pay | Admitting: Physician Assistant

## 2016-02-03 MED ORDER — AMITRIPTYLINE HCL 10 MG PO TABS: ORAL_TABLET | ORAL | Status: DC

## 2016-02-03 MED FILL — AMITRIPTYLINE HCL 10 MG TAB: 10 | 90 days supply | Qty: 270 | Fill #0

## 2016-02-08 MED FILL — DEXTROAMP-AMPHETAMIN 20 MG: 20 | 30 days supply | Qty: 60 | Fill #0

## 2016-03-13 ENCOUNTER — Other Ambulatory Visit: Payer: Self-pay | Admitting: Physician Assistant

## 2016-03-13 MED FILL — BYDUREON 2 MG PEN INJECT: 2 | 28 days supply | Qty: 4 | Fill #1

## 2016-03-13 MED FILL — DEXTROAMP-AMPHETAMIN 20 MG: 20 | 30 days supply | Qty: 60 | Fill #0

## 2016-03-13 MED FILL — metFORMIN HCL 500 MG TABS: 500 | 30 days supply | Qty: 90 | Fill #1

## 2016-03-13 MED FILL — CELECOXIB 100 MG CAPSULE: 100 | 30 days supply | Qty: 60 | Fill #1

## 2016-03-13 MED FILL — PANTOPRAZOLE SOD DR 40 MG T: 40 | 90 days supply | Qty: 90 | Fill #1

## 2016-04-21 MED FILL — metFORMIN HCL 500 MG TABS: 500 | 30 days supply | Qty: 90 | Fill #2

## 2016-04-21 MED FILL — SYNTHROID 175 MCG TABLET: 175 | 90 days supply | Qty: 90 | Fill #1

## 2016-04-21 MED FILL — DEXTROAMP-AMPHETAMIN 20 MG: 20 | 30 days supply | Qty: 60 | Fill #0

## 2016-05-15 MED FILL — ACYCLOVIR 200 MG/5 ML SUSP: 200 | 5 days supply | Qty: 125 | Fill #0

## 2016-05-17 ENCOUNTER — Other Ambulatory Visit: Payer: Self-pay | Admitting: Physician Assistant

## 2016-05-17 MED FILL — metFORMIN HCL 500 MG TABS: 500 | 30 days supply | Qty: 90 | Fill #3

## 2016-05-17 MED FILL — CYANOCOBALAMIN 1,000 MCG/ML: 1000 | 90 days supply | Qty: 10 | Fill #1

## 2016-05-17 MED FILL — AMITRIPTYLINE HCL 10 MG TAB: 10 | 90 days supply | Qty: 270 | Fill #0

## 2016-05-17 MED FILL — BYDUREON 2 MG PEN INJECT: 2 | 28 days supply | Qty: 4 | Fill #2

## 2016-05-18 ENCOUNTER — Other Ambulatory Visit: Payer: Self-pay | Admitting: Physician Assistant

## 2016-05-18 ENCOUNTER — Encounter: Payer: Self-pay | Admitting: Physician Assistant

## 2016-05-19 ENCOUNTER — Telehealth: Payer: Self-pay | Admitting: *Deleted

## 2016-05-19 MED ORDER — AMPHETAMINE-DEXTROAMPHETAMINE 20 MG PO TABS: 20.0000 mg | ORAL_TABLET | Freq: Two times a day (BID) | ORAL | 0 refills | Status: DC

## 2016-05-19 MED ORDER — CELECOXIB 100 MG PO CAPS: 100.0000 mg | ORAL_CAPSULE | Freq: Two times a day (BID) | ORAL | 1 refills | Status: DC

## 2016-05-19 MED FILL — CELECOXIB 100 MG CAPSULE: 100 | 60 days supply | Qty: 60 | Fill #0

## 2016-05-19 NOTE — Telephone Encounter (Signed)
Rx request to pharmacy/SLS  

## 2016-05-22 MED FILL — DEXTROAMP-AMPHETAMIN 20 MG: 20 | 30 days supply | Qty: 60 | Fill #0

## 2016-06-12 ENCOUNTER — Encounter: Payer: Self-pay | Admitting: Physician Assistant

## 2016-06-19 ENCOUNTER — Encounter: Payer: Self-pay | Admitting: Physician Assistant

## 2016-06-19 MED ORDER — ONDANSETRON HCL 4 MG PO TABS: 4.0000 mg | ORAL_TABLET | Freq: Three times a day (TID) | ORAL | 0 refills | Status: DC | PRN

## 2016-06-19 MED FILL — ONDANSETRON HCL 4 MG TABLET: 4 | 6 days supply | Qty: 20 | Fill #0

## 2016-06-27 MED FILL — DEXTROAMP-AMPHETAMIN 20 MG: 20 | 30 days supply | Qty: 60 | Fill #0

## 2016-06-29 MED FILL — metFORMIN HCL 500 MG TABS: 500 | 30 days supply | Qty: 90 | Fill #4

## 2016-08-07 ENCOUNTER — Other Ambulatory Visit: Payer: Self-pay | Admitting: Physician Assistant

## 2016-08-07 MED FILL — metFORMIN HCL 500 MG TABS: 500 | 30 days supply | Qty: 90 | Fill #5

## 2016-08-07 MED FILL — PANTOPRAZOLE SOD DR 40 MG T: 40 | 31 days supply | Qty: 31 | Fill #0

## 2016-08-07 NOTE — Telephone Encounter (Signed)
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

## 2016-08-08 MED FILL — AMITRIPTYLINE HCL 10 MG TAB: 10 | 30 days supply | Qty: 90 | Fill #0

## 2016-09-02 IMAGING — DX DG CHEST 2V
2 series · 2 of 2 positions shown · non-contrast
Comparison: None.

CLINICAL DATA: Midsternal chest pain, shortness of breast starting
today

EXAM:
CHEST  2 VIEW

[chest pa]
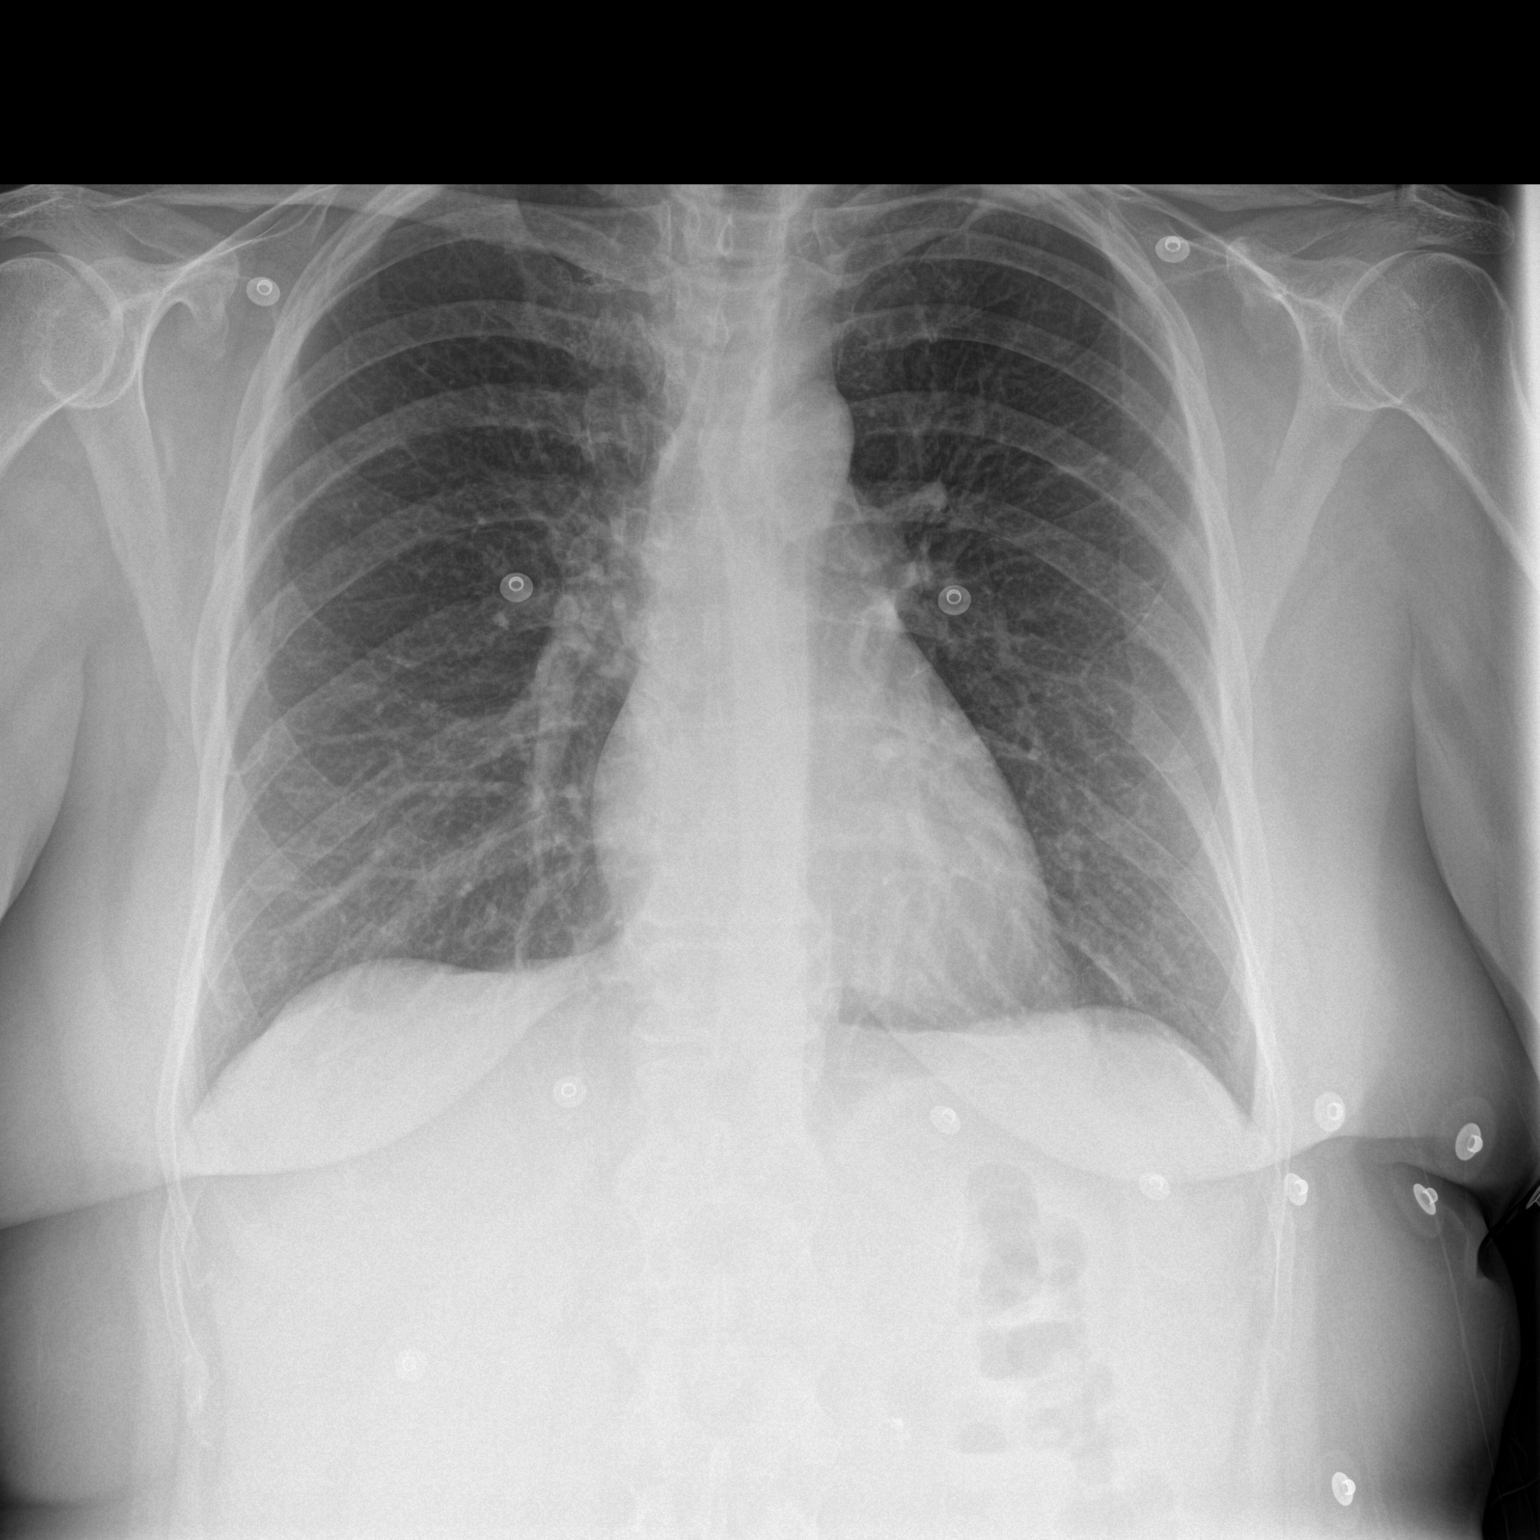

[chest lat]
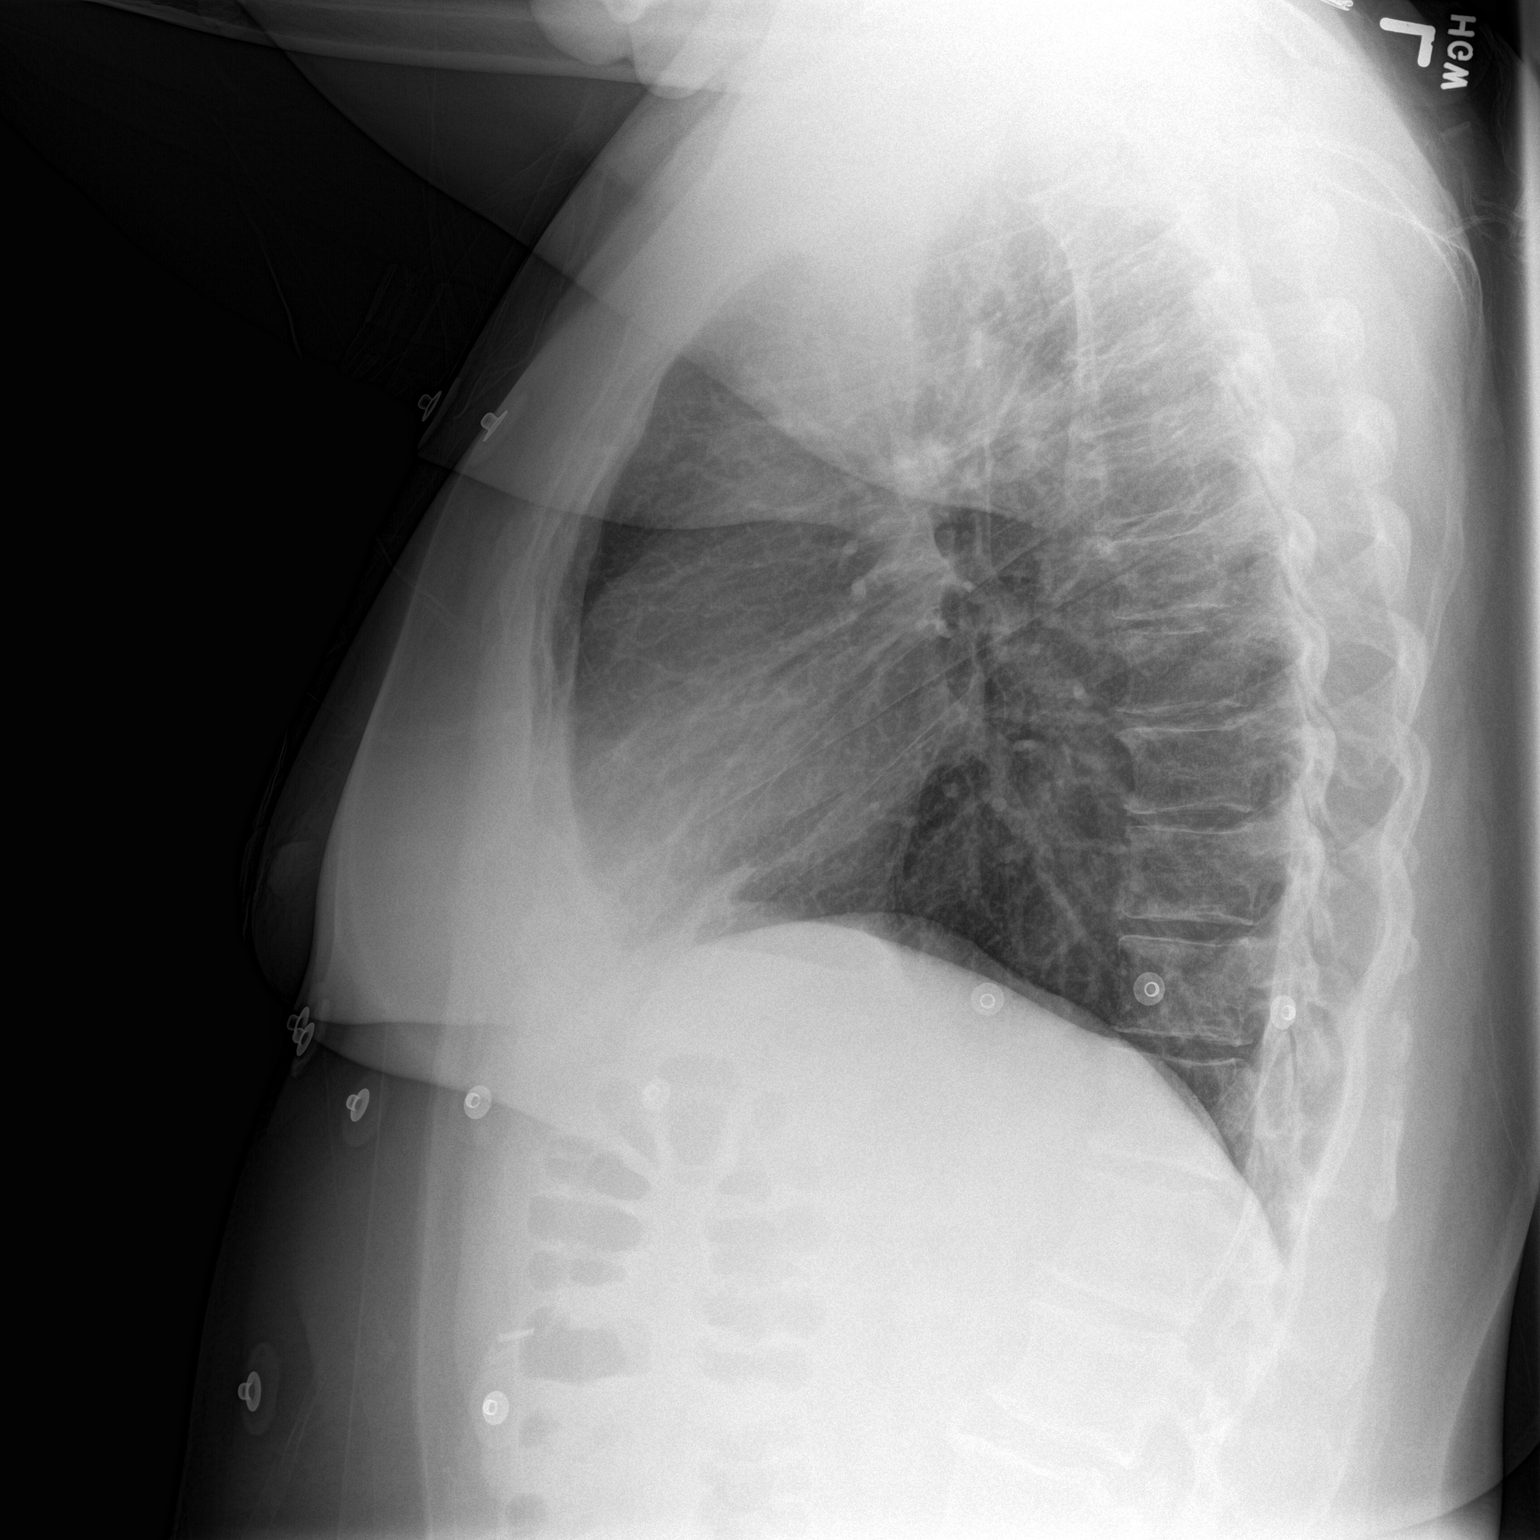

[2 of 2 positions shown; findings below may reference images not displayed]

FINDINGS: Cardiomediastinal silhouette is unremarkable. No acute infiltrate or
pleural effusion. No pulmonary edema. Minimal degenerative changes
mid thoracic spine.
IMPRESSION: No active cardiopulmonary disease.

## 2016-09-06 MED FILL — AMITRIPTYLINE HCL 10 MG TAB: 10 | 30 days supply | Qty: 90 | Fill #1

## 2016-09-06 MED FILL — PANTOPRAZOLE SOD DR 40 MG T: 40 | 31 days supply | Qty: 31 | Fill #1

## 2016-09-07 ENCOUNTER — Other Ambulatory Visit: Payer: Self-pay | Admitting: Emergency Medicine

## 2016-09-07 ENCOUNTER — Encounter (HOSPITAL_COMMUNITY): Payer: Self-pay

## 2016-09-07 MED ORDER — SYNTHROID 175 MCG PO TABS: ORAL_TABLET | ORAL | 0 refills | Status: DC

## 2016-09-07 MED ORDER — METFORMIN HCL 500 MG PO TABS: ORAL_TABLET | ORAL | 3 refills | Status: DC

## 2016-09-07 MED FILL — SYNTHROID 175 MCG TABLET: 175 | 30 days supply | Qty: 30 | Fill #0

## 2016-09-07 MED FILL — metFORMIN HCL 500 MG TABS: 500 | 30 days supply | Qty: 90 | Fill #0

## 2016-09-08 ENCOUNTER — Other Ambulatory Visit: Payer: Self-pay | Admitting: Physician Assistant

## 2016-09-11 ENCOUNTER — Encounter: Payer: Self-pay | Admitting: Emergency Medicine

## 2016-09-11 ENCOUNTER — Encounter: Payer: Self-pay | Admitting: Physician Assistant

## 2016-09-11 MED ORDER — AMPHETAMINE-DEXTROAMPHETAMINE 20 MG PO TABS: 20.0000 mg | ORAL_TABLET | Freq: Two times a day (BID) | ORAL | 0 refills | Status: DC

## 2016-09-11 MED FILL — DEXTROAMP-AMPHETAMIN 20 MG: 20 | 30 days supply | Qty: 60 | Fill #0

## 2016-09-11 NOTE — Telephone Encounter (Signed)
Will grant Rx. Needs to give UDS and sign new CSC before further refills.

## 2016-09-11 NOTE — Telephone Encounter (Signed)
Notified patient husband Timmothy Sours that rx is ready for pick up. My chart message sent to patient that rx is ready for pick up and will need UDS and to sign a new CSC for more refills.

## 2016-09-11 NOTE — Telephone Encounter (Signed)
Last Rx: 05/19/16 #60 0RF Last Ov: 01/24/16 Pending Ov: 10/03/16  Please advise for refill

## 2016-09-11 NOTE — Telephone Encounter (Signed)
Patient calling to request refill of amphetamine-dextroamphetamine (ADDERALL) 20 MG tablet.  Please call husband Timmothy Sours) at [phone] when rx ready for pickup.

## 2016-10-03 ENCOUNTER — Encounter: Payer: Self-pay | Admitting: Emergency Medicine

## 2016-10-03 ENCOUNTER — Encounter: Payer: Self-pay | Admitting: Physician Assistant

## 2016-10-03 ENCOUNTER — Ambulatory Visit (INDEPENDENT_AMBULATORY_CARE_PROVIDER_SITE_OTHER): Payer: 59 | Admitting: Physician Assistant

## 2016-10-03 VITALS — BP 110/76 | HR 87 | Temp 97.9°F | Resp 14 | Ht 67.0 in | Wt 200.0 lb

## 2016-10-03 DIAGNOSIS — F909 Attention-deficit hyperactivity disorder, unspecified type: Secondary | ICD-10-CM | POA: Diagnosis not present

## 2016-10-03 DIAGNOSIS — E119 Type 2 diabetes mellitus without complications: Secondary | ICD-10-CM

## 2016-10-03 DIAGNOSIS — E038 Other specified hypothyroidism: Secondary | ICD-10-CM

## 2016-10-03 DIAGNOSIS — E785 Hyperlipidemia, unspecified: Secondary | ICD-10-CM | POA: Diagnosis not present

## 2016-10-03 LAB — MICROALBUMIN / CREATININE URINE RATIO
CREATININE, U: 96.8 mg/dL
MICROALB/CREAT RATIO: 0.7 mg/g (ref 0.0–30.0)

## 2016-10-03 LAB — COMPREHENSIVE METABOLIC PANEL
ALBUMIN: 4.1 g/dL (ref 3.5–5.2)
ALK PHOS: 48 U/L (ref 39–117)
ALT: 11 U/L (ref 0–35)
AST: 13 U/L (ref 0–37)
BUN: 10 mg/dL (ref 6–23)
CALCIUM: 9.4 mg/dL (ref 8.4–10.5)
CO2: 29 mEq/L (ref 19–32)
Chloride: 104 mEq/L (ref 96–112)
Creatinine, Ser: 0.77 mg/dL (ref 0.40–1.20)
GFR: 82.17 mL/min (ref >60.00)
GLUCOSE: 140 mg/dL — AB (ref 70–99)
POTASSIUM: 4.3 meq/L (ref 3.5–5.1)
Sodium: 139 mEq/L (ref 135–145)
TOTAL PROTEIN: 6.6 g/dL (ref 6.0–8.3)
Total Bilirubin: 0.7 mg/dL (ref 0.2–1.2)

## 2016-10-03 LAB — HEMOGLOBIN A1C: Hgb A1c MFr Bld: 6.8 % — ABNORMAL HIGH (ref 4.6–6.5)

## 2016-10-03 LAB — LIPID PANEL
CHOLESTEROL: 221 mg/dL — AB (ref 0–200)
HDL: 86.9 mg/dL (ref >39.00)
LDL CALC: 124 mg/dL — AB (ref 0–99)
NonHDL: 134.53
Total CHOL/HDL Ratio: 3
Triglycerides: 54 mg/dL (ref 0.0–149.0)
VLDL: 10.8 mg/dL (ref 0.0–40.0)

## 2016-10-03 LAB — TSH: TSH: 1.15 u[IU]/mL (ref 0.35–4.50)

## 2016-10-03 MED ORDER — AMPHETAMINE-DEXTROAMPHETAMINE 20 MG PO TABS: 20.0000 mg | ORAL_TABLET | Freq: Two times a day (BID) | ORAL | 0 refills | Status: DC

## 2016-10-03 NOTE — Progress Notes (Signed)
History of Present Illness: Patient is a 55 y.o. female who presents to clinic today for follow-up of Diabetes Mellitus II, well-controlled.  Patient currently on medication regimen of Metformin 500 mg AM and 1000 mg PM. Is taking medications as directed. Endorses staying active and eating a well-balanced diet.  Denies polyuria, polydipsia, polyphagia. Denies vision changes. Denies numbness or tingling of extremities. . Is not checking blood glucose.  Latest Maintenance: A1C --  Lab Results  Component Value Date   HGBA1C 6.6 (H) 01/24/2016   Diabetic Eye Exam -- Scheduled with Dr. Anders Simmonds on January 3rd Urine Microalbumin -- overdue. Will check today.  Foot Exam -- up-to-date. No concerns today.   Patient also with history of hypothyroidism, currently on levothyroxine 175 mcg daily. Endorses taking as directed. Thyroid has been well-controlled on this regimen. Patient is due for recheck.  Lab Results  Component Value Date   TSH 1.41 01/24/2016   Lastly, patient following up for ADD. Is currently on Adderall 20 mg. Takes once to twice daily on weekdays depending on work schedule. Does not take on weekends. Denies side effects of medication. Is resting well currently. Denies anorexia.   Past Medical History:  Diagnosis Date  . 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 Chicken pox   . Diabetes mellitus    Resolved w/Gastric Sleeve  . GERD (gastroesophageal reflux disease)   . History of kidney stones   . Hyperlipidemia    Had Gastric Sleeve  . Hypothyroidism   . MVP (mitral valve prolapse)    hx. of [>30 yrs]  . Obesity    Had Gastric Sleeve  . Thyroid disease     Current Outpatient Prescriptions on File Prior to Visit  Medication Sig Dispense Refill  . amitriptyline (ELAVIL) 10 MG tablet TAKE 1 TO 3 TABLETS AT BEDTIME AS NEEDED 270 tablet 0  . BYDUREON 2 MG PEN INJECT 2 MG ONCE WEEKLY 4 each 3  . celecoxib (CELEBREX) 100 MG capsule Take 1 capsule (100 mg total) by mouth 2 (two) times  daily. 60 capsule 1  . cyanocobalamin (,VITAMIN B-12,) 1000 MCG/ML injection INJECT 1000MCG INTO THE SKIN MONTHLY.  4  . metFORMIN (GLUCOPHAGE) 500 MG tablet Take 1 tablet each AM and 2 tablets each PM 90 tablet 3  . ondansetron (ZOFRAN) 4 MG tablet Take 1 tablet (4 mg total) by mouth every 8 (eight) hours as needed for nausea or vomiting. 20 tablet 0  . pantoprazole (PROTONIX) 40 MG tablet TAKE 1 TABLET BY MOUTH DAILY 90 tablet 0  . SYNTHROID 175 MCG tablet TAKE 1 TABLET (175 MCG) BY MOUTH DAILY BEFORE BREAKFAST 90 tablet 0  . VASCEPA 1 g CAPS TAKE 1 CAPSULE BY MOUTH TWICE DAILY 120 capsule 1  . aspirin 81 MG tablet Take 81 mg by mouth daily.    . Calcium Carb-Cholecalciferol (CALCIUM-VITAMIN D3) 600-400 MG-UNIT CAPS Take 2 tablets daily as directed (Patient not taking: Reported on 10/03/2016) 60 capsule 5   No current facility-administered medications on file prior to visit.     Allergies  Allergen Reactions  . Codeine Nausea And Vomiting  . Morphine And Related Nausea And Vomiting    Family History  Problem Relation Age of Onset  . Cancer Other   . Ulcerative colitis Other   . Lung cancer Mother   . Colitis Mother   . Heart disease Mother   . Lung cancer Father   . Heart disease Father   . Alzheimer's disease  Maternal Grandmother   . Heart attack Maternal Grandfather   . Heart attack Paternal Grandfather   . Breast cancer Maternal Aunt   . Alzheimer's disease Maternal Aunt   . Hypothyroidism Mother   . Stroke Paternal Aunt   . Healthy Sister     x3  . Healthy Daughter     x3  . Graves' disease Son     Social History   Social History  . Marital status: Married    Spouse name: N/A  . Number of children: N/A  . Years of education: N/A   Social History Main Topics  . Smoking status: Never Smoker  . Smokeless tobacco: Never Used  . Alcohol use No     Comment: rare occ.  . Drug use: No  . Sexual activity: Yes   Other Topics Concern  . None   Social History  Narrative  . None    Review of Systems: Pertinent ROS are listed in HPI  Physical Examination: BP 110/76   Pulse 87   Temp 97.9 F (36.6 C) (Oral)   Resp 14   Ht 5\' 7"  (1.702 m)   Wt 200 lb (90.7 kg)   SpO2 99%   BMI 31.32 kg/m  General appearance: alert, cooperative, appears stated age and no distress Head: Normocephalic, without obvious abnormality, atraumatic Nose: Nares normal. Septum midline. Mucosa normal. No drainage or sinus tenderness. Throat: lips, mucosa, and tongue normal; teeth and gums normal Lungs: clear to auscultation bilaterally Heart: regular rate and rhythm, S1, S2 normal, no murmur, click, rub or gallop Extremities: extremities normal, atraumatic, no cyanosis or edema Pulses: 2+ and symmetric Neurologic: Alert and oriented X 3, normal strength and tone. Normal symmetric reflexes. Normal coordination and gait  Assessment/Plan: Hypothyroidism Repeat labs today. Will alter medication dose accordingly.   Hyperlipidemia LDL goal <100 In patient with DM II. Check lipids today. Work towards LDL goal < 70. Patient taking 81 mg ASA daily.   Diabetes mellitus type II, controlled Foot exam up-to-date. Eye exam scheduled. BP normotensive. Will check CMP, A1C, Lipids and Urine Microalbumin today. Patient up-to-date on Flu shot.  Will alter regimen based on results.   ADD (attention deficit disorder) Doing very well. Will update CSC today and get UDS. Medication refilled.

## 2016-10-03 NOTE — Patient Instructions (Signed)
Please go to the lab for blood work. I will call you with your results.  Please continue current medication regimen for now. I will alter if indicated based on your lab results.  Please continue good diet and exercise.  Follow-up will be based on results.   Diabetes Mellitus and Exercise Exercising regularly is important for your overall health, especially when you have diabetes (diabetes mellitus). Exercising is not only about losing weight. It has many health benefits, such as increasing muscle strength and bone density and reducing body fat and stress. This leads to improved fitness, flexibility, and endurance, all of which result in better overall health. Exercise has additional benefits for people with diabetes, including:  Reducing appetite.  Helping to lower and control blood glucose.  Lowering blood pressure.  Helping to control amounts of fatty substances (lipids) in the blood, such as cholesterol and triglycerides.  Helping the body to respond better to insulin (improving insulin sensitivity).  Reducing how much insulin the body needs.  Decreasing the risk for heart disease by:  Lowering cholesterol and triglyceride levels.  Increasing the levels of good cholesterol.  Lowering blood glucose levels. What is my activity plan? Your health care provider or certified diabetes educator can help you make a plan for the type and frequency of exercise (activity plan) that works for you. Make sure that you:  Do at least 150 minutes of moderate-intensity or vigorous-intensity exercise each week. This could be brisk walking, biking, or water aerobics.  Do stretching and strength exercises, such as yoga or weightlifting, at least 2 times a week.  Spread out your activity over at least 3 days of the week.  Get some form of physical activity every day.  Do not go more than 2 days in a row without some kind of physical activity.  Avoid being inactive for more than 90 minutes  at a time. Take frequent breaks to walk or stretch.  Choose a type of exercise or activity that you enjoy, and set realistic goals.  Start slowly, and gradually increase the intensity of your exercise over time. What do I need to know about managing my diabetes?  Check your blood glucose before and after exercising.  If your blood glucose is higher than 240 mg/dL (13.3 mmol/L) before you exercise, check your urine for ketones. If you have ketones in your urine, do not exercise until your blood glucose returns to normal.  Know the symptoms of low blood glucose (hypoglycemia) and how to treat it. Your risk for hypoglycemia increases during and after exercise. Common symptoms of hypoglycemia can include:  Hunger.  Anxiety.  Sweating and feeling clammy.  Confusion.  Dizziness or feeling light-headed.  Increased heart rate or palpitations.  Blurry vision.  Tingling or numbness around the mouth, lips, or tongue.  Tremors or shakes.  Irritability.  Keep a rapid-acting carbohydrate snack available before, during, and after exercise to help prevent or treat hypoglycemia.  Avoid injecting insulin into areas of the body that are going to be exercised. For example, avoid injecting insulin into:  The arms, when playing tennis.  The legs, when jogging.  Keep records of your exercise habits. Doing this can help you and your health care provider adjust your diabetes management plan as needed. Write down:  Food that you eat before and after you exercise.  Blood glucose levels before and after you exercise.  The type and amount of exercise you have done.  When your insulin is expected to peak, if you  use insulin. Avoid exercising at times when your insulin is peaking.  When you start a new exercise or activity, work with your health care provider to make sure the activity is safe for you, and to adjust your insulin, medicines, or food intake as needed.  Drink plenty of water while  you exercise to prevent dehydration or heat stroke. Drink enough fluid to keep your urine clear or pale yellow. This information is not intended to replace advice given to you by your health care provider. Make sure you discuss any questions you have with your health care provider. Document Released: 12/30/2003 Document Revised: 04/28/2016 Document Reviewed: 03/20/2016 Elsevier Interactive Patient Education  2017 [employer].

## 2016-10-03 NOTE — Assessment & Plan Note (Signed)
Doing very well. Will update CSC today and get UDS. Medication refilled.

## 2016-10-03 NOTE — Assessment & Plan Note (Signed)
Foot exam up-to-date. Eye exam scheduled. BP normotensive. Will check CMP, A1C, Lipids and Urine Microalbumin today. Patient up-to-date on Flu shot.  Will alter regimen based on results.

## 2016-10-03 NOTE — Progress Notes (Signed)
Pre visit review using our clinic review tool, if applicable. No additional management support is needed unless otherwise documented below in the visit note. 

## 2016-10-03 NOTE — Assessment & Plan Note (Signed)
Repeat labs today. Will alter medication dose accordingly.

## 2016-10-03 NOTE — Assessment & Plan Note (Signed)
In patient with DM II. Check lipids today. Work towards LDL goal < 70. Patient taking 81 mg ASA daily.

## 2016-10-04 ENCOUNTER — Encounter: Payer: Self-pay | Admitting: Physician Assistant

## 2016-10-05 MED ORDER — METFORMIN HCL 500 MG PO TABS: ORAL_TABLET | ORAL | 3 refills | Status: DC

## 2016-10-06 ENCOUNTER — Encounter: Payer: Self-pay | Admitting: Emergency Medicine

## 2016-10-12 ENCOUNTER — Other Ambulatory Visit: Payer: Self-pay | Admitting: Emergency Medicine

## 2016-10-12 DIAGNOSIS — E785 Hyperlipidemia, unspecified: Secondary | ICD-10-CM

## 2016-10-12 MED ORDER — ATORVASTATIN CALCIUM 10 MG PO TABS: 10.0000 mg | ORAL_TABLET | Freq: Every day | ORAL | 0 refills | Status: DC

## 2016-10-12 NOTE — Progress Notes (Signed)
Sent in Lipitor 10 mg daily and ordered hepatic function panel for recheck after LFT's

## 2016-10-27 ENCOUNTER — Telehealth: Payer: 59 | Admitting: Family

## 2016-10-27 DIAGNOSIS — J069 Acute upper respiratory infection, unspecified: Secondary | ICD-10-CM

## 2016-10-27 MED ORDER — BENZONATATE 100 MG PO CAPS: 100.0000 mg | ORAL_CAPSULE | Freq: Two times a day (BID) | ORAL | 0 refills | Status: DC | PRN

## 2016-10-27 MED ORDER — ALBUTEROL SULFATE HFA 108 (90 BASE) MCG/ACT IN AERS: 2.0000 | INHALATION_SPRAY | Freq: Four times a day (QID) | RESPIRATORY_TRACT | 0 refills | Status: DC | PRN

## 2016-10-27 NOTE — Progress Notes (Signed)
We are sorry that you are not feeling well.  Here is how we plan to help!  Based on what you have shared with me it looks like you have upper respiratory tract inflammation that has resulted in a significant cough.  Inflammation and infection in the upper respiratory tract is commonly called bronchitis and has four common causes:  Allergies, Viral Infections, Acid Reflux and Bacterial Infections.  Allergies, viruses and acid reflux are treated by controlling symptoms or eliminating the cause. An example might be a cough caused by taking certain blood pressure medications. You stop the cough by changing the medication. Another example might be a cough caused by acid reflux. Controlling the reflux helps control the cough.  Based on your presentation I believe you most likely have A cough due to bacteria.  When patients have a fever and a productive cough with a change in color or increased sputum production, we are concerned about bacterial bronchitis.  If left untreated it can progress to pneumonia.  If your symptoms do not improve with your treatment plan it is important that you contact your provider.  Continue your antibiotic.    In addition you may use A non-prescription cough medication called Robitussin DAC. Take 2 teaspoons every 8 hours or Delsym: take 2 teaspoons every 12 hours., A non-prescription cough medication called Mucinex DM: take 2 tablets every 12 hours. and A prescription cough medication called Tessalon Perles 100mg . You may take 1-2 capsules every 8 hours as needed for your cough.    USE OF BRONCHODILATOR ("RESCUE") INHALERS: There is a risk from using your bronchodilator too frequently.  The risk is that over-reliance on a medication which only relaxes the muscles surrounding the breathing tubes can reduce the effectiveness of medications prescribed to reduce swelling and congestion of the tubes themselves.  Although you feel brief relief from the bronchodilator inhaler, your asthma  may actually be worsening with the tubes becoming more swollen and filled with mucus.  This can delay other crucial treatments, such as oral steroid medications. If you need to use a bronchodilator inhaler daily, several times per day, you should discuss this with your provider.  There are probably better treatments that could be used to keep your asthma under control.     HOME CARE . Only take medications as instructed by your medical team. . Complete the entire course of an antibiotic. . Drink plenty of fluids and get plenty of rest. . Avoid close contacts especially the very young and the elderly . Cover your mouth if you cough or cough into your sleeve. . Always remember to wash your hands . A steam or ultrasonic humidifier can help congestion.   GET HELP RIGHT AWAY IF: . You develop worsening fever. . You become short of breath . You cough up blood. . Your symptoms persist after you have completed your treatment plan MAKE SURE YOU   Understand these instructions.  Will watch your condition.  Will get help right away if you are not doing well or get worse.  Your e-visit answers were reviewed by a board certified advanced clinical practitioner to complete your personal care plan.  Depending on the condition, your plan could have included both over the counter or prescription medications. If there is a problem please reply  once you have received a response from your provider. Your safety is important to [place].  If you have drug allergies check your prescription carefully.    You can use MyChart to ask questions  about today's visit, request a non-urgent call back, or ask for a work or school excuse for 24 hours related to this e-Visit. If it has been greater than 24 hours you will need to follow up with your provider, or enter a new e-Visit to address those concerns. You will get an e-mail in the next two days asking about your experience.  I hope that your e-visit has been valuable and  will speed your recovery. Thank you for using e-visits.

## 2016-11-07 ENCOUNTER — Encounter: Payer: Self-pay | Admitting: Physician Assistant

## 2016-11-09 ENCOUNTER — Other Ambulatory Visit: Payer: Self-pay | Admitting: Physician Assistant

## 2016-11-09 ENCOUNTER — Encounter: Payer: Self-pay | Admitting: Physician Assistant

## 2016-11-10 ENCOUNTER — Other Ambulatory Visit: Payer: Self-pay | Admitting: Emergency Medicine

## 2016-11-10 MED ORDER — ICOSAPENT ETHYL 1 G PO CAPS: 1.0000 | ORAL_CAPSULE | Freq: Two times a day (BID) | ORAL | 1 refills | Status: DC

## 2016-11-10 MED ORDER — SYNTHROID 175 MCG PO TABS: ORAL_TABLET | ORAL | 1 refills | Status: DC

## 2016-11-10 MED ORDER — ONDANSETRON HCL 4 MG PO TABS: 4.0000 mg | ORAL_TABLET | Freq: Three times a day (TID) | ORAL | 0 refills | Status: DC | PRN

## 2016-11-10 MED ORDER — AMPHETAMINE-DEXTROAMPHETAMINE 20 MG PO TABS: 20.0000 mg | ORAL_TABLET | Freq: Two times a day (BID) | ORAL | 0 refills | Status: DC

## 2016-11-10 MED ORDER — PANTOPRAZOLE SODIUM 40 MG PO TBEC: 40.0000 mg | DELAYED_RELEASE_TABLET | Freq: Every day | ORAL | 1 refills | Status: DC

## 2016-11-10 MED ORDER — CELECOXIB 100 MG PO CAPS: 100.0000 mg | ORAL_CAPSULE | Freq: Two times a day (BID) | ORAL | 1 refills | Status: DC

## 2016-11-10 MED ORDER — AMITRIPTYLINE HCL 10 MG PO TABS: ORAL_TABLET | ORAL | 1 refills | Status: DC

## 2016-11-10 NOTE — Telephone Encounter (Signed)
Patient requesting 90 rx for the Adderall to be sent to [facility] filled 10/03/16 #60

## 2016-11-10 NOTE — Telephone Encounter (Signed)
Dr. Birdie Riddle,  Would you be willing to sign for 90-day supply to go to mail order pharmacy? Patient low risk. [place] database reviewed today.

## 2016-11-10 NOTE — Telephone Encounter (Signed)
Advised patient the rx is ready for pick up. She wanted the rx mailed to [facility].

## 2016-11-10 NOTE — Progress Notes (Signed)
Patient wants 90 rx for the Adderall Last filled 10/03/16 #60

## 2016-11-10 NOTE — Telephone Encounter (Signed)
Dr. Birdie Riddle agreed to write for the medication.  Has printed and signed. I placed up front for patient to pick up as she will have to mail in to her mail-order pharmacy.

## 2016-11-15 ENCOUNTER — Encounter: Payer: Self-pay | Admitting: Physician Assistant

## 2016-11-17 ENCOUNTER — Encounter: Payer: Self-pay | Admitting: Physician Assistant

## 2016-11-28 ENCOUNTER — Telehealth: Payer: Self-pay | Admitting: *Deleted

## 2016-11-28 NOTE — Telephone Encounter (Signed)
Richardson Landry from [facility] called to say that patient was sent a RX for Adderall 20 mg - this is on backorder and they are asking if you can prescribe 10 mg for patient to be able to get her medication.   If so, he asked that it just be sent in electronically with directions.

## 2016-11-28 NOTE — Telephone Encounter (Signed)
Spoke with patient and she did not want to try the Adderall XR because she wanted the medication out of her system at bedtime. She is agreeable with the Adderall 10 mg taking 2 tablet bid. They will give it to her at the same price.

## 2016-11-28 NOTE — Telephone Encounter (Signed)
Dr. Birdie Riddle will have to agree to write since it is a 90-day supply of a controlled substance.   Are you willing to write the Rx? Patient is very reliable with medications and only takes as directed.

## 2016-11-28 NOTE — Telephone Encounter (Signed)
Would speak with patient to see if she would like to try Adderall Xr 20 mg instead.

## 2016-11-29 ENCOUNTER — Other Ambulatory Visit: Payer: Self-pay | Admitting: Physician Assistant

## 2016-11-29 NOTE — Telephone Encounter (Signed)
Please inform patient that we will have to consider 30-day supply as MD is not comfortable for writing for that many tablets of Adderall.

## 2016-11-29 NOTE — Telephone Encounter (Signed)
360 pills is more than I am comfortable with at one time.  Unfortunately, she will have to get her scripts monthly

## 2016-11-30 ENCOUNTER — Encounter: Payer: Self-pay | Admitting: Emergency Medicine

## 2016-11-30 NOTE — Telephone Encounter (Signed)
Ok to print 30-day of adderall 10 mg -- take 2 tablets (20 mg) by mouth twice daily. Quantity 60 with 0 refill. Her local pharmacy may have her regular dose if she wants to check first.

## 2016-11-30 NOTE — Addendum Note (Signed)
Addended by: Leonidas Romberg on: 11/30/2016 01:08 PM   Modules accepted: Orders

## 2016-11-30 NOTE — Telephone Encounter (Signed)
Spoke with patient about Dr. Birdie Riddle not comfortable with prescribing 360 pills for 90 days supply. Einar Pheasant is agreeable with doing 30 day supply of the Adderall to local pharmacy if patient is agreeable. Not sure that the mail order will take 30 days supply rx of the Adderall.

## 2016-12-20 ENCOUNTER — Encounter: Payer: Self-pay | Admitting: Physician Assistant

## 2016-12-21 MED ORDER — METFORMIN HCL 500 MG PO TABS: ORAL_TABLET | ORAL | 1 refills | Status: DC

## 2017-04-16 ENCOUNTER — Other Ambulatory Visit: Payer: Self-pay | Admitting: Physician Assistant

## 2017-04-23 ENCOUNTER — Other Ambulatory Visit: Payer: Self-pay | Admitting: Family Medicine

## 2017-04-23 ENCOUNTER — Other Ambulatory Visit: Payer: Self-pay | Admitting: Physician Assistant

## 2017-04-23 ENCOUNTER — Encounter: Payer: Self-pay | Admitting: Physician Assistant

## 2017-04-23 MED ORDER — AMPHETAMINE-DEXTROAMPHETAMINE 20 MG PO TABS: 20.0000 mg | ORAL_TABLET | Freq: Two times a day (BID) | ORAL | 0 refills | Status: DC

## 2017-04-23 MED ORDER — AMITRIPTYLINE HCL 10 MG PO TABS: ORAL_TABLET | ORAL | 1 refills | Status: DC

## 2017-04-23 MED FILL — DEXTROAMP-AMPHETAMIN 20 MG: 20 | 30 days supply | Qty: 60 | Fill #0

## 2017-04-23 NOTE — Telephone Encounter (Signed)
Pt called and stated that her husband would be in [place] this afternoon and would like to come by and pick this up

## 2017-04-23 NOTE — Telephone Encounter (Signed)
30-day Rx for Adderall printed for pick up. I have sent refill of Amitriptyline to her mail-order pharmacy. She is overdue for a follow-up and will need before further prescriptions can be given.

## 2017-05-11 ENCOUNTER — Encounter: Payer: Self-pay | Admitting: Physician Assistant

## 2017-05-11 ENCOUNTER — Other Ambulatory Visit: Payer: Self-pay | Admitting: Emergency Medicine

## 2017-05-11 ENCOUNTER — Ambulatory Visit (INDEPENDENT_AMBULATORY_CARE_PROVIDER_SITE_OTHER): Payer: 59 | Admitting: Physician Assistant

## 2017-05-11 VITALS — BP 108/62 | HR 82 | Temp 97.8°F | Resp 14 | Ht 67.0 in | Wt 206.0 lb

## 2017-05-11 DIAGNOSIS — E038 Other specified hypothyroidism: Secondary | ICD-10-CM | POA: Diagnosis not present

## 2017-05-11 DIAGNOSIS — Z23 Encounter for immunization: Secondary | ICD-10-CM | POA: Diagnosis not present

## 2017-05-11 DIAGNOSIS — E1169 Type 2 diabetes mellitus with other specified complication: Secondary | ICD-10-CM

## 2017-05-11 DIAGNOSIS — E119 Type 2 diabetes mellitus without complications: Secondary | ICD-10-CM

## 2017-05-11 DIAGNOSIS — F909 Attention-deficit hyperactivity disorder, unspecified type: Secondary | ICD-10-CM | POA: Diagnosis not present

## 2017-05-11 DIAGNOSIS — E785 Hyperlipidemia, unspecified: Secondary | ICD-10-CM | POA: Diagnosis not present

## 2017-05-11 LAB — HEMOGLOBIN A1C: Hgb A1c MFr Bld: 6.9 % — ABNORMAL HIGH (ref 4.6–6.5)

## 2017-05-11 LAB — COMPREHENSIVE METABOLIC PANEL
ALBUMIN: 4.1 g/dL (ref 3.5–5.2)
ALK PHOS: 47 U/L (ref 39–117)
ALT: 10 U/L (ref 0–35)
AST: 14 U/L (ref 0–37)
BILIRUBIN TOTAL: 0.6 mg/dL (ref 0.2–1.2)
BUN: 14 mg/dL (ref 6–23)
CALCIUM: 9.9 mg/dL (ref 8.4–10.5)
CO2: 29 mEq/L (ref 19–32)
Chloride: 101 mEq/L (ref 96–112)
Creatinine, Ser: 1 mg/dL (ref 0.40–1.20)
GFR: 60.64 mL/min (ref >60.00)
GLUCOSE: 205 mg/dL — AB (ref 70–99)
Potassium: 3.9 mEq/L (ref 3.5–5.1)
Sodium: 138 mEq/L (ref 135–145)
TOTAL PROTEIN: 6.6 g/dL (ref 6.0–8.3)

## 2017-05-11 LAB — TSH: TSH: 22.09 u[IU]/mL — AB (ref 0.35–4.50)

## 2017-05-11 LAB — LIPID PANEL
CHOL/HDL RATIO: 3
Cholesterol: 205 mg/dL — ABNORMAL HIGH (ref 0–200)
HDL: 81 mg/dL (ref >39.00)
LDL CALC: 111 mg/dL — AB (ref 0–99)
NonHDL: 124.26
Triglycerides: 66 mg/dL (ref 0.0–149.0)
VLDL: 13.2 mg/dL (ref 0.0–40.0)

## 2017-05-11 MED ORDER — AMPHETAMINE-DEXTROAMPHETAMINE 20 MG PO TABS: 20.0000 mg | ORAL_TABLET | Freq: Two times a day (BID) | ORAL | 0 refills | Status: DC

## 2017-05-11 NOTE — Progress Notes (Signed)
Pre visit review using our clinic review tool, if applicable. No additional management support is needed unless otherwise documented below in the visit note. 

## 2017-05-11 NOTE — Assessment & Plan Note (Signed)
Previously well-controlled on Metformin. Eye exam up-to-date. Foot exam updated today noting chronic decreased sensation of great toes bilaterally without new findings. Labs today to further assess. Pneumovax given today.

## 2017-05-11 NOTE — Addendum Note (Signed)
Addended by: Leonidas Romberg on: 05/11/2017 12:01 PM   Modules accepted: Orders

## 2017-05-11 NOTE — Assessment & Plan Note (Signed)
Previously declined statin. Is taking Vascepa. Voucher card given. Discussed dietary and exercise recommendations. Will alter regimen based on results -- repeat labs today.

## 2017-05-11 NOTE — Patient Instructions (Signed)
Please go to the lab for blood work. We will message you with results and next steps.  Please continue current medication regimen. Schedule appointment for your mammogram. We need to keep up-to-date on this.  We will alter your medications based on results.

## 2017-05-11 NOTE — Progress Notes (Signed)
History of Present Illness: Patient is a 56 y.o. female who presents to clinic today for follow-up of Diabetes Mellitus II, previously well-controlled.  Patient currently on medication regimen of Metformin 500 mg AM and 1000 mg PM.  Previously on Bydureon but this was stopped due to A1C levels.  Is taking medications as directed. Endorses staying very active and trying to eat a well-balanced diet.  Denies vision changes, tingling of extremities. Is not checking blood glucose as directed.   Is also overdue for follow-up of hyperlipidemia and hypothyroidism. Is currently on a regimen of Vascepa for hyperlipidemia (refuses statin therapy). Is also on levothyroxine 175 mcg. Is taking as directed.   Patient also in need of refill of her Adderall 20 mg tablets. Is taking QD on weekends and BID during the work week. Endorses great focus with this medication regimen. Denies side effect of medication.    Latest Maintenance: A1C --  Lab Results  Component Value Date   HGBA1C 6.8 (H) 10/03/2016   Diabetic Eye Exam -- Patient endorses earlier this year. She will bring in records.  Foot Exam -- Due today. Denies concerns.  Past Medical History:  Diagnosis Date  . 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 Chicken pox   . Diabetes mellitus    Resolved w/Gastric Sleeve  . GERD (gastroesophageal reflux disease)   . History of kidney stones   . Hyperlipidemia    Had Gastric Sleeve  . Hypothyroidism   . MVP (mitral valve prolapse)    hx. of [>30 yrs]  . Obesity    Had Gastric Sleeve  . Thyroid disease     Current Outpatient Prescriptions on File Prior to Visit  Medication Sig Dispense Refill  . amitriptyline (ELAVIL) 10 MG tablet TAKE 1 TO 3 TABLETS AT BEDTIME AS NEEDED 270 tablet 1  . amphetamine-dextroamphetamine (ADDERALL) 20 MG tablet Take 1 tablet (20 mg total) by mouth 2 (two) times daily. 60 tablet 0  . aspirin 81 MG tablet Take 81 mg by mouth daily.    . Calcium Carb-Cholecalciferol  (CALCIUM-VITAMIN D3) 600-400 MG-UNIT CAPS Take 2 tablets daily as directed 60 capsule 5  . celecoxib (CELEBREX) 100 MG capsule Take 1 capsule (100 mg total) by mouth 2 (two) times daily. 180 capsule 1  . cyanocobalamin (,VITAMIN B-12,) 1000 MCG/ML injection INJECT 1000MCG INTO THE SKIN MONTHLY.  4  . Icosapent Ethyl (VASCEPA) 1 g CAPS Take 1 capsule by mouth 2 (two) times daily. 180 capsule 1  . metFORMIN (GLUCOPHAGE) 500 MG tablet Take 1 tablet each AM and 2 tablets each PM 270 tablet 1  . ondansetron (ZOFRAN) 4 MG tablet Take 1 tablet (4 mg total) by mouth every 8 (eight) hours as needed for nausea or vomiting. 90 tablet 0  . pantoprazole (PROTONIX) 40 MG tablet TAKE 1 TABLET BY MOUTH  DAILY 90 tablet 0  . SYNTHROID 175 MCG tablet TAKE 1 TABLET BY MOUTH  DAILY BEFORE BREAKFAST 90 tablet 0   No current facility-administered medications on file prior to visit.     Allergies  Allergen Reactions  . Codeine Nausea And Vomiting  . Morphine And Related Nausea And Vomiting    Family History  Problem Relation Age of Onset  . Cancer Other   . Ulcerative colitis Other   . Lung cancer Mother   . Colitis Mother   . Heart disease Mother   . Lung cancer Father   . Heart disease Father   . Alzheimer's  disease Maternal Grandmother   . Heart attack Maternal Grandfather   . Heart attack Paternal Grandfather   . Breast cancer Maternal Aunt   . Alzheimer's disease Maternal Aunt   . Hypothyroidism Mother   . Stroke Paternal Aunt   . Healthy Sister        x3  . Healthy Daughter        x3  . Graves' disease Son     Social History   Social History  . Marital status: Married    Spouse name: N/A  . Number of children: N/A  . Years of education: N/A   Social History Main Topics  . Smoking status: Never Smoker  . Smokeless tobacco: Never Used  . Alcohol use No     Comment: rare occ.  . Drug use: No  . Sexual activity: Yes   Other Topics Concern  . None   Social History Narrative  .  None   Review of Systems: Pertinent ROS are listed in HPI  Physical Exam  Constitutional: She is oriented to person, place, and time and well-developed, well-nourished, and in no distress.  HENT:  Head: Normocephalic and atraumatic.  Eyes: Conjunctivae are normal.  Neck: Neck supple.  Cardiovascular: Normal rate, regular rhythm, normal heart sounds and intact distal pulses.   Pulmonary/Chest: Effort normal and breath sounds normal. No respiratory distress. She has no wheezes. She has no rales. She exhibits no tenderness.  Abdominal: Soft. Bowel sounds are normal. She exhibits no distension and no mass. There is no tenderness. There is no rebound and no guarding.  Neurological: She is alert and oriented to person, place, and time.  Skin: Skin is warm and dry. No rash noted.  Psychiatric: Affect normal.  Vitals reviewed.  Diabetic Foot Exam - Simple   Simple Foot Form Diabetic Foot exam was performed with the following findings:  Yes 05/11/2017  9:54 AM  Visual Inspection No deformities, no ulcerations, no other skin breakdown bilaterally:  Yes Sensation Testing See comments:  Yes Pulse Check Posterior Tibialis and Dorsalis pulse intact bilaterally:  Yes Comments Absent sensation of great toes bilaterally. Otherwise within normal limits.      Assessment/Plan: Hypothyroidism Repeat TSH levels today to assess. Will alter medication dose accordingly.   Hyperlipidemia associated with type 2 diabetes mellitus ([place]) Previously declined statin. Is taking Vascepa. Voucher card given. Discussed dietary and exercise recommendations. Will alter regimen based on results -- repeat labs today.  Diabetes mellitus type II, controlled Previously well-controlled on Metformin. Eye exam up-to-date. Foot exam updated today noting chronic decreased sensation of great toes bilaterally without new findings. Labs today to further assess. Pneumovax given today.  ADD (attention deficit disorder) Doing  very well. Continue current regimen. CSC on file. UDS up-to-date.     [name], PA-C

## 2017-05-11 NOTE — Assessment & Plan Note (Signed)
Repeat TSH levels today to assess. Will alter medication dose accordingly.

## 2017-05-11 NOTE — Assessment & Plan Note (Signed)
Doing very well. Continue current regimen. CSC on file. UDS up-to-date.

## 2017-05-15 ENCOUNTER — Encounter (HOSPITAL_COMMUNITY): Payer: Self-pay

## 2017-05-15 ENCOUNTER — Ambulatory Visit: Payer: Self-pay | Admitting: Physician Assistant

## 2017-06-12 ENCOUNTER — Other Ambulatory Visit: Payer: Self-pay | Admitting: Physician Assistant

## 2017-06-26 MED FILL — CYANOCOBALAMIN 1,000 MCG/ML: 1000 | 21 days supply | Qty: 14 | Fill #0

## 2017-07-09 ENCOUNTER — Other Ambulatory Visit: Payer: Self-pay | Admitting: Physician Assistant

## 2017-07-11 ENCOUNTER — Encounter: Payer: Self-pay | Admitting: Emergency Medicine

## 2017-07-11 ENCOUNTER — Other Ambulatory Visit: Payer: Self-pay | Admitting: Physician Assistant

## 2017-07-11 DIAGNOSIS — E119 Type 2 diabetes mellitus without complications: Secondary | ICD-10-CM

## 2017-07-11 DIAGNOSIS — E038 Other specified hypothyroidism: Secondary | ICD-10-CM

## 2017-09-19 ENCOUNTER — Other Ambulatory Visit: Payer: Self-pay

## 2017-09-19 ENCOUNTER — Encounter: Payer: Self-pay | Admitting: Physician Assistant

## 2017-09-19 ENCOUNTER — Other Ambulatory Visit: Payer: Self-pay | Admitting: General Practice

## 2017-09-19 ENCOUNTER — Ambulatory Visit (INDEPENDENT_AMBULATORY_CARE_PROVIDER_SITE_OTHER): Payer: 59 | Admitting: Physician Assistant

## 2017-09-19 VITALS — BP 118/70 | HR 70 | Temp 98.3°F | Resp 14 | Ht 67.0 in | Wt 209.0 lb

## 2017-09-19 DIAGNOSIS — E785 Hyperlipidemia, unspecified: Secondary | ICD-10-CM

## 2017-09-19 DIAGNOSIS — E038 Other specified hypothyroidism: Secondary | ICD-10-CM | POA: Diagnosis not present

## 2017-09-19 DIAGNOSIS — Z1211 Encounter for screening for malignant neoplasm of colon: Secondary | ICD-10-CM | POA: Diagnosis not present

## 2017-09-19 DIAGNOSIS — F909 Attention-deficit hyperactivity disorder, unspecified type: Secondary | ICD-10-CM

## 2017-09-19 DIAGNOSIS — J208 Acute bronchitis due to other specified organisms: Secondary | ICD-10-CM | POA: Diagnosis not present

## 2017-09-19 DIAGNOSIS — E1169 Type 2 diabetes mellitus with other specified complication: Secondary | ICD-10-CM | POA: Diagnosis not present

## 2017-09-19 DIAGNOSIS — B9689 Other specified bacterial agents as the cause of diseases classified elsewhere: Secondary | ICD-10-CM

## 2017-09-19 LAB — LIPID PANEL
Cholesterol: 200 mg/dL (ref 0–200)
HDL: 65.4 mg/dL (ref >39.00)
LDL Cholesterol: 120 mg/dL — ABNORMAL HIGH (ref 0–99)
NONHDL: 134.28
Total CHOL/HDL Ratio: 3
Triglycerides: 71 mg/dL (ref 0.0–149.0)
VLDL: 14.2 mg/dL (ref 0.0–40.0)

## 2017-09-19 LAB — COMPREHENSIVE METABOLIC PANEL
ALT: 11 U/L (ref 0–35)
AST: 13 U/L (ref 0–37)
Albumin: 3.9 g/dL (ref 3.5–5.2)
Alkaline Phosphatase: 50 U/L (ref 39–117)
BUN: 9 mg/dL (ref 6–23)
CHLORIDE: 105 meq/L (ref 96–112)
CO2: 29 meq/L (ref 19–32)
CREATININE: 0.84 mg/dL (ref 0.40–1.20)
Calcium: 9.3 mg/dL (ref 8.4–10.5)
GFR: 74.06 mL/min (ref >60.00)
GLUCOSE: 144 mg/dL — AB (ref 70–99)
Potassium: 4.3 mEq/L (ref 3.5–5.1)
SODIUM: 139 meq/L (ref 135–145)
Total Bilirubin: 0.6 mg/dL (ref 0.2–1.2)
Total Protein: 6.5 g/dL (ref 6.0–8.3)

## 2017-09-19 LAB — TSH: TSH: 7.36 u[IU]/mL — AB (ref 0.35–4.50)

## 2017-09-19 MED ORDER — AMPHETAMINE-DEXTROAMPHETAMINE 20 MG PO TABS: 20.0000 mg | ORAL_TABLET | Freq: Two times a day (BID) | ORAL | 0 refills | Status: DC

## 2017-09-19 MED ORDER — BENZONATATE 200 MG PO CAPS: 200.0000 mg | ORAL_CAPSULE | Freq: Two times a day (BID) | ORAL | 0 refills | Status: DC | PRN

## 2017-09-19 MED ORDER — AZITHROMYCIN 250 MG PO TABS: ORAL_TABLET | ORAL | 0 refills | Status: DC

## 2017-09-19 MED FILL — BENZONATATE 200 MG CAP: 200 | 10 days supply | Qty: 20 | Fill #0

## 2017-09-19 MED FILL — AZITHROMYCIN 250 MG TABLET: 250 | 5 days supply | Qty: 6 | Fill #0

## 2017-09-19 NOTE — Patient Instructions (Signed)
Please go to the lab today for blood work.  I will call you with your results. We will alter treatment regimen(s) if indicated by your results.   You will be contacted by Gastroenterology to schedule your colonoscopy.  Take antibiotic (Azithromycin) as directed.  Increase fluids.  Get plenty of rest. Use Mucinex for congestion. Tessalon as directed. Take a daily probiotic (I recommend Align or Culturelle, but even Activia Yogurt may be beneficial).  A humidifier placed in the bedroom may offer some relief for a dry, scratchy throat of nasal irritation.  Read information below on acute bronchitis. Please call or return to clinic if symptoms are not improving.  Acute Bronchitis Bronchitis is when the airways that extend from the windpipe into the lungs get red, puffy, and painful (inflamed). Bronchitis often causes thick spit (mucus) to develop. This leads to a cough. A cough is the most common symptom of bronchitis. In acute bronchitis, the condition usually begins suddenly and goes away over time (usually in 2 weeks). Smoking, allergies, and asthma can make bronchitis worse. Repeated episodes of bronchitis may cause more lung problems.  HOME CARE  Rest.  Drink enough fluids to keep your pee (urine) clear or pale yellow (unless you need to limit fluids as told by your doctor).  Only take over-the-counter or prescription medicines as told by your doctor.  Avoid smoking and secondhand smoke. These can make bronchitis worse. If you are a smoker, think about using nicotine gum or skin patches. Quitting smoking will help your lungs heal faster.  Reduce the chance of getting bronchitis again by:  Washing your hands often.  Avoiding people with cold symptoms.  Trying not to touch your hands to your mouth, nose, or eyes.  Follow up with your doctor as told.  GET HELP IF: Your symptoms do not improve after 1 week of treatment. Symptoms include:  Cough.  Fever.  Coughing up thick  spit.  Body aches.  Chest congestion.  Chills.  Shortness of breath.  Sore throat.  GET HELP RIGHT AWAY IF:   You have an increased fever.  You have chills.  You have severe shortness of breath.  You have bloody thick spit (sputum).  You throw up (vomit) often.  You lose too much body fluid (dehydration).  You have a severe headache.  You faint.  MAKE SURE YOU:   Understand these instructions.  Will watch your condition.  Will get help right away if you are not doing well or get worse. Document Released: 03/27/2008 Document Revised: 06/11/2013 Document Reviewed: 04/01/2013 [facility] Patient Information 2015 [place], [place]. This information is not intended to replace advice given to you by your health care provider. Make sure you discuss any questions you have with your health care provider.

## 2017-09-19 NOTE — Progress Notes (Signed)
Patient presents to clinic today c/o week of scratchy throat, voice hoarseness, chest congestion and cough that has now become productive of green sputum. Also noting new onset of fever starting yesterday evening. Has taken Nyquil, Dayquil, albuterol inhaler. Denies recent travel or sick contact outside of work.   Patient also overdue for repeat check of lipids, thyroid and BMP. Is fasting for labs today.  Past Medical History:  Diagnosis Date  . 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 Chicken pox   . Diabetes mellitus    Resolved w/Gastric Sleeve  . GERD (gastroesophageal reflux disease)   . History of kidney stones   . Hyperlipidemia    Had Gastric Sleeve  . Hypothyroidism   . MVP (mitral valve prolapse)    hx. of [>30 yrs]  . Obesity    Had Gastric Sleeve  . Thyroid disease     Current Outpatient Medications on File Prior to Visit  Medication Sig Dispense Refill  . amitriptyline (ELAVIL) 10 MG tablet TAKE 1 TO 3 TABLETS AT BEDTIME AS NEEDED 270 tablet 1  . aspirin 81 MG tablet Take 81 mg by mouth daily.    . celecoxib (CELEBREX) 100 MG capsule Take 1 capsule (100 mg total) by mouth 2 (two) times daily. 180 capsule 1  . cyanocobalamin (,VITAMIN B-12,) 1000 MCG/ML injection INJECT 1000MCG INTO THE SKIN MONTHLY.  4  . metFORMIN (GLUCOPHAGE) 500 MG tablet TAKE 1 TABLET EACH AM AND 2 TABLETS EACH PM 270 tablet 1  . ondansetron (ZOFRAN) 4 MG tablet TAKE 1 TABLET BY MOUTH  EVERY 8 HOURS AS NEEDED FOR NAUSEA AND VOMITING 90 tablet 0  . pantoprazole (PROTONIX) 40 MG tablet TAKE 1 TABLET BY MOUTH  DAILY 90 tablet 1  . SYNTHROID 175 MCG tablet TAKE 1 TABLET BY MOUTH  DAILY BEFORE BREAKFAST 90 tablet 1  . Calcium Carb-Cholecalciferol (CALCIUM-VITAMIN D3) 600-400 MG-UNIT CAPS Take 2 tablets daily as directed (Patient not taking: Reported on 09/19/2017) 60 capsule 5  . Icosapent Ethyl (VASCEPA) 1 g CAPS Take 1 capsule by mouth 2 (two) times daily. (Patient not taking: Reported on 09/19/2017) 180  capsule 1   No current facility-administered medications on file prior to visit.     Allergies  Allergen Reactions  . Codeine Nausea And Vomiting  . Morphine And Related Nausea And Vomiting    Family History  Problem Relation Age of Onset  . Cancer Other   . Ulcerative colitis Other   . Lung cancer Mother   . Colitis Mother   . Heart disease Mother   . Lung cancer Father   . Heart disease Father   . Alzheimer's disease Maternal Grandmother   . Heart attack Maternal Grandfather   . Heart attack Paternal Grandfather   . Breast cancer Maternal Aunt   . Alzheimer's disease Maternal Aunt   . Hypothyroidism Mother   . Stroke Paternal Aunt   . Healthy Sister        x3  . Healthy Daughter        x3  . Graves' disease Son     Social History   Socioeconomic History  . Marital status: Married    Spouse name: None  . Number of children: None  . Years of education: None  . Highest education level: None  Social Needs  . Financial resource strain: None  . Food insecurity - worry: None  . Food insecurity - inability: None  . Transportation needs - medical: None  .  Transportation needs - non-medical: None  Occupational History  . None  Tobacco Use  . Smoking status: Never Smoker  . Smokeless tobacco: Never Used  Substance and Sexual Activity  . Alcohol use: No    Comment: rare occ.  . Drug use: No  . Sexual activity: Yes  Other Topics Concern  . None  Social History Narrative  . None   Review of Systems - See HPI.  All other ROS are negative.  BP 118/70   Pulse 70   Temp 98.3 F (36.8 C) (Oral)   Resp 14   Ht 5\' 7"  (1.702 m)   Wt 209 lb (94.8 kg)   SpO2 96%   BMI 32.73 kg/m   Physical Exam  Constitutional: She is oriented to person, place, and time and well-developed, well-nourished, and in no distress.  HENT:  Head: Normocephalic and atraumatic.  Right Ear: External ear normal.  Left Ear: External ear normal.  Nose: Nose normal.  Mouth/Throat:  Oropharynx is clear and moist. No oropharyngeal exudate.  TM within normal limits bilaterally.  Eyes: Conjunctivae are normal.  Neck: Neck supple.  Cardiovascular: Normal rate, regular rhythm, normal heart sounds and intact distal pulses.  Pulmonary/Chest: Effort normal and breath sounds normal. No respiratory distress. She has no wheezes. She has no rales. She exhibits no tenderness.  Neurological: She is alert and oriented to person, place, and time.  Skin: Skin is warm and dry. No rash noted.  Psychiatric: Affect normal.  Vitals reviewed.  Assessment/Plan: Hypothyroidism TSH today.  Hyperlipidemia associated with type 2 diabetes mellitus ([place]) Repeat fasting lipid panel today along with metabolic panel.  Colon cancer screening Referral to gastroenterology placed.  Acute bacterial bronchitis Rx Doxycycline.  Increase fluids.  Rest.  Saline nasal spray.  Probiotic.  Mucinex as directed.  Humidifier in bedroom. Continue allergy medications. Tessalon per orders.  Call or return to clinic if symptoms are not improving.     [name], PA-C

## 2017-09-19 NOTE — Progress Notes (Signed)
Pre visit review using our clinic review tool, if applicable. No additional management support is needed unless otherwise documented below in the visit note. 

## 2017-09-19 NOTE — Assessment & Plan Note (Signed)
TSH today 

## 2017-09-19 NOTE — Assessment & Plan Note (Signed)
Repeat fasting lipid panel today along with metabolic panel.

## 2017-09-19 NOTE — Assessment & Plan Note (Signed)
Referral to gastroenterology placed.

## 2017-09-19 NOTE — Assessment & Plan Note (Signed)
Rx Doxycycline.  Increase fluids.  Rest.  Saline nasal spray.  Probiotic.  Mucinex as directed.  Humidifier in bedroom. Continue allergy medications. Tessalon per orders.  Call or return to clinic if symptoms are not improving.

## 2017-09-20 NOTE — Addendum Note (Signed)
Addended by: Leonidas Romberg on: 09/20/2017 12:56 PM   Modules accepted: Orders

## 2017-09-29 ENCOUNTER — Other Ambulatory Visit: Payer: Self-pay | Admitting: Physician Assistant

## 2017-10-08 ENCOUNTER — Other Ambulatory Visit: Payer: Self-pay | Admitting: Physician Assistant

## 2017-12-16 ENCOUNTER — Other Ambulatory Visit: Payer: Self-pay | Admitting: Physician Assistant

## 2018-01-17 ENCOUNTER — Other Ambulatory Visit: Payer: Self-pay | Admitting: Physician Assistant

## 2018-02-06 ENCOUNTER — Telehealth: Payer: 59 | Admitting: Family

## 2018-02-06 DIAGNOSIS — R399 Unspecified symptoms and signs involving the genitourinary system: Secondary | ICD-10-CM

## 2018-02-06 MED ORDER — CEPHALEXIN 500 MG PO CAPS: 500.0000 mg | ORAL_CAPSULE | Freq: Two times a day (BID) | ORAL | 0 refills | Status: DC

## 2018-02-06 MED ORDER — CIPROFLOXACIN HCL 500 MG PO TABS: 500.0000 mg | ORAL_TABLET | Freq: Two times a day (BID) | ORAL | 0 refills | Status: DC

## 2018-02-06 NOTE — Progress Notes (Signed)

## 2018-02-06 NOTE — Addendum Note (Signed)
Addended by: Evelina Dun A on: 02/06/2018 03:50 PM   Modules accepted: Orders

## 2018-02-19 ENCOUNTER — Ambulatory Visit (INDEPENDENT_AMBULATORY_CARE_PROVIDER_SITE_OTHER): Payer: 59 | Admitting: Physician Assistant

## 2018-02-19 ENCOUNTER — Encounter: Payer: Self-pay | Admitting: Physician Assistant

## 2018-02-19 ENCOUNTER — Other Ambulatory Visit: Payer: Self-pay

## 2018-02-19 VITALS — BP 112/80 | HR 74 | Temp 97.9°F | Resp 16 | Ht 67.0 in | Wt 210.0 lb

## 2018-02-19 DIAGNOSIS — E119 Type 2 diabetes mellitus without complications: Secondary | ICD-10-CM

## 2018-02-19 DIAGNOSIS — E785 Hyperlipidemia, unspecified: Secondary | ICD-10-CM

## 2018-02-19 DIAGNOSIS — E1169 Type 2 diabetes mellitus with other specified complication: Secondary | ICD-10-CM

## 2018-02-19 DIAGNOSIS — Z1231 Encounter for screening mammogram for malignant neoplasm of breast: Secondary | ICD-10-CM | POA: Diagnosis not present

## 2018-02-19 DIAGNOSIS — R399 Unspecified symptoms and signs involving the genitourinary system: Secondary | ICD-10-CM

## 2018-02-19 DIAGNOSIS — F988 Other specified behavioral and emotional disorders with onset usually occurring in childhood and adolescence: Secondary | ICD-10-CM

## 2018-02-19 DIAGNOSIS — Z0184 Encounter for antibody response examination: Secondary | ICD-10-CM

## 2018-02-19 DIAGNOSIS — E039 Hypothyroidism, unspecified: Secondary | ICD-10-CM | POA: Diagnosis not present

## 2018-02-19 DIAGNOSIS — Z1239 Encounter for other screening for malignant neoplasm of breast: Secondary | ICD-10-CM

## 2018-02-19 LAB — HEMOGLOBIN A1C: Hgb A1c MFr Bld: 7.3 % — ABNORMAL HIGH (ref 4.6–6.5)

## 2018-02-19 LAB — POCT URINALYSIS DIPSTICK
Glucose, UA: NEGATIVE
KETONES UA: NEGATIVE
Nitrite, UA: NEGATIVE
PH UA: 5 (ref 5.0–8.0)
Protein, UA: 15
Spec Grav, UA: 1.015 (ref 1.010–1.025)
UROBILINOGEN UA: 0.2 U/dL

## 2018-02-19 LAB — TSH: TSH: 3.01 u[IU]/mL (ref 0.35–4.50)

## 2018-02-19 LAB — LIPID PANEL
Cholesterol: 223 mg/dL — ABNORMAL HIGH (ref 0–200)
HDL: 70.3 mg/dL (ref >39.00)
LDL Cholesterol: 136 mg/dL — ABNORMAL HIGH (ref 0–99)
NONHDL: 152.3
Total CHOL/HDL Ratio: 3
Triglycerides: 84 mg/dL (ref 0.0–149.0)
VLDL: 16.8 mg/dL (ref 0.0–40.0)

## 2018-02-19 LAB — COMPREHENSIVE METABOLIC PANEL
ALBUMIN: 3.8 g/dL (ref 3.5–5.2)
ALK PHOS: 45 U/L (ref 39–117)
ALT: 12 U/L (ref 0–35)
AST: 13 U/L (ref 0–37)
BILIRUBIN TOTAL: 0.6 mg/dL (ref 0.2–1.2)
BUN: 11 mg/dL (ref 6–23)
CO2: 28 mEq/L (ref 19–32)
CREATININE: 0.83 mg/dL (ref 0.40–1.20)
Calcium: 9.2 mg/dL (ref 8.4–10.5)
Chloride: 103 mEq/L (ref 96–112)
GFR: 74.98 mL/min (ref >60.00)
GLUCOSE: 126 mg/dL — AB (ref 70–99)
Potassium: 4.2 mEq/L (ref 3.5–5.1)
SODIUM: 138 meq/L (ref 135–145)
TOTAL PROTEIN: 6.5 g/dL (ref 6.0–8.3)

## 2018-02-19 LAB — MICROALBUMIN / CREATININE URINE RATIO
Creatinine,U: 137.8 mg/dL
MICROALB/CREAT RATIO: 1.5 mg/g (ref 0.0–30.0)
Microalb, Ur: 2.1 mg/dL — ABNORMAL HIGH (ref 0.0–1.9)

## 2018-02-19 LAB — VITAMIN B12: Vitamin B-12: 319 pg/mL (ref 211–911)

## 2018-02-19 NOTE — Assessment & Plan Note (Signed)
Doing well. Continue current regimen.  

## 2018-02-19 NOTE — Assessment & Plan Note (Signed)
Taking medication as directed. Repeat labs today. Discussed proper diet and exercise regimen.

## 2018-02-19 NOTE — Patient Instructions (Signed)
Please go to the lab today for blood work.  I will call you with your results. We will alter treatment regimen(s) if indicated by your results.   I have sent urine for culture. We will treat based on results. Keep well-hydrated and take a daily probiotic.   We are rechecking b12 level today so we can refill injections.   Make sure to take your medications as directed.  Keep up with exercise. Work [employer]!   Diabetes Mellitus and Exercise Exercising regularly is important for your overall health, especially when you have diabetes (diabetes mellitus). Exercising is not only about losing weight. It has many health benefits, such as increasing muscle strength and bone density and reducing body fat and stress. This leads to improved fitness, flexibility, and endurance, all of which result in better overall health. Exercise has additional benefits for people with diabetes, including:  Reducing appetite.  Helping to lower and control blood glucose.  Lowering blood pressure.  Helping to control amounts of fatty substances (lipids) in the blood, such as cholesterol and triglycerides.  Helping the body to respond better to insulin (improving insulin sensitivity).  Reducing how much insulin the body needs.  Decreasing the risk for heart disease by: ? Lowering cholesterol and triglyceride levels. ? Increasing the levels of good cholesterol. ? Lowering blood glucose levels.  What is my activity plan? Your health care provider or certified diabetes educator can help you make a plan for the type and frequency of exercise (activity plan) that works for you. Make sure that you:  Do at least 150 minutes of moderate-intensity or vigorous-intensity exercise each week. This could be brisk walking, biking, or water aerobics. ? Do stretching and strength exercises, such as yoga or weightlifting, at least 2 times a week. ? Spread out your activity over at least 3 days of the week.  Get some  form of physical activity every day. ? Do not go more than 2 days in a row without some kind of physical activity. ? Avoid being inactive for more than 90 minutes at a time. Take frequent breaks to walk or stretch.  Choose a type of exercise or activity that you enjoy, and set realistic goals.  Start slowly, and gradually increase the intensity of your exercise over time.  What do I need to know about managing my diabetes?  Check your blood glucose before and after exercising. ? If your blood glucose is higher than 240 mg/dL (13.3 mmol/L) before you exercise, check your urine for ketones. If you have ketones in your urine, do not exercise until your blood glucose returns to normal.  Know the symptoms of low blood glucose (hypoglycemia) and how to treat it. Your risk for hypoglycemia increases during and after exercise. Common symptoms of hypoglycemia can include: ? Hunger. ? Anxiety. ? Sweating and feeling clammy. ? Confusion. ? Dizziness or feeling light-headed. ? Increased heart rate or palpitations. ? Blurry vision. ? Tingling or numbness around the mouth, lips, or tongue. ? Tremors or shakes. ? Irritability.  Keep a rapid-acting carbohydrate snack available before, during, and after exercise to help prevent or treat hypoglycemia.  Avoid injecting insulin into areas of the body that are going to be exercised. For example, avoid injecting insulin into: ? The arms, when playing tennis. ? The legs, when jogging.  Keep records of your exercise habits. Doing this can help you and your health care provider adjust your diabetes management plan as needed. Write down: ? Food that you eat  before and after you exercise. ? Blood glucose levels before and after you exercise. ? The type and amount of exercise you have done. ? When your insulin is expected to peak, if you use insulin. Avoid exercising at times when your insulin is peaking.  When you start a new exercise or activity, work  with your health care provider to make sure the activity is safe for you, and to adjust your insulin, medicines, or food intake as needed.  Drink plenty of water while you exercise to prevent dehydration or heat stroke. Drink enough fluid to keep your urine clear or pale yellow. This information is not intended to replace advice given to you by your health care provider. Make sure you discuss any questions you have with your health care provider. Document Released: 12/30/2003 Document Revised: 04/28/2016 Document Reviewed: 03/20/2016 Elsevier Interactive Patient Education  2018 [employer].

## 2018-02-19 NOTE — Assessment & Plan Note (Signed)
Not taking medication daily as directed. Discussed importance of this. Recommend she set an alarm daily. Will check TSH level today.

## 2018-02-19 NOTE — Assessment & Plan Note (Signed)
Repeat labs today -- A1C, CMP, Lipids. Continue current regimen for now. Patient to schedule eye examination. Foot exam up-to-date. Immunizations are up-to-date. Will alter regimen according to results.

## 2018-02-19 NOTE — Progress Notes (Signed)
Patient presents to clinic today for follow-up of chronic medical issues. Patient with history of DM II, Hyperlipidemia, Hypothyroidism, ADD. Is overdue for follow-up by several months.   Diabetes Mellitus II -- Patient is currently on a regimen of 500 mg AM and 100 mg PM. Is taking medications as directed. Is not checking sugars. Is due for urine microalbumin. Foot exam up-to-date. Last eye exam 11/2016. Is scheduling a follow-up appointment.   Lab Results  Component Value Date   HGBA1C 6.9 (H) 05/11/2017   Hypothyroidism -- Patient is currently taking levothyroxine 175 mcg daily. Is taking but forgets to take everyday. Overdue for TSH level. She was supposed to return in December for this but had not.   Lab Results  Component Value Date   TSH 7.36 (H) 09/19/2017   ADD -- Is currently on a regimen of Adderall 20 mg BID. Has done very well on this regimen. Is needing refills to mail-order pharmacy.   [facility] also [facility]. Was given 5 day course of Macrobid. Did take as directed without improvement. Had an e-visit for which she was given Keflex. Took as directed with resolution of most symptoms. Notes very mild frequency but all other symptoms have resolved. Has cut back on caffeine intake. Marland Kitchen    Past Medical History:  Diagnosis Date  . 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 Chicken pox   . Diabetes mellitus    Resolved w/Gastric Sleeve  . GERD (gastroesophageal reflux disease)   . History of kidney stones   . Hyperlipidemia    Had Gastric Sleeve  . Hypothyroidism   . MVP (mitral valve prolapse)    hx. of [>30 yrs]  . Obesity    Had Gastric Sleeve  . Thyroid disease     Current Outpatient Medications on File Prior to Visit  Medication Sig Dispense Refill  . amitriptyline (ELAVIL) 10 MG tablet TAKE 1 TO 3 TABLETS AT  BEDTIME AS NEEDED 270 tablet 1  . amphetamine-dextroamphetamine (ADDERALL) 20 MG tablet Take 1 tablet (20 mg total) by mouth 2 (two)  times daily. 180 tablet 0  . aspirin 81 MG tablet Take 325 mg by mouth daily.     . Calcium Carb-Cholecalciferol (CALCIUM-VITAMIN D3) 600-400 MG-UNIT CAPS Take 2 tablets daily as directed 60 capsule 5  . celecoxib (CELEBREX) 100 MG capsule TAKE 1 CAPSULE BY MOUTH TWO TIMES DAILY 180 capsule 1  . cyanocobalamin (,VITAMIN B-12,) 1000 MCG/ML injection INJECT 1000MCG INTO THE SKIN MONTHLY.  4  . metFORMIN (GLUCOPHAGE) 500 MG tablet TAKE 1 TABLET EACH AM AND 2 TABLETS EACH PM 270 tablet 1  . ondansetron (ZOFRAN) 4 MG tablet TAKE 1 TABLET BY MOUTH  EVERY 8 HOURS AS NEEDED FOR NAUSEA AND VOMITING 90 tablet 0  . pantoprazole (PROTONIX) 40 MG tablet TAKE 1 TABLET BY MOUTH  DAILY 90 tablet 1  . SYNTHROID 175 MCG tablet TAKE 1 TABLET BY MOUTH  DAILY BEFORE BREAKFAST 90 tablet 0  . Icosapent Ethyl (VASCEPA) 1 g CAPS Take 1 capsule by mouth 2 (two) times daily. (Patient not taking: Reported on 02/19/2018) 180 capsule 1   No current facility-administered medications on file prior to visit.     Allergies  Allergen Reactions  . Codeine Nausea And Vomiting  . Morphine And Related Nausea And Vomiting    Family History  Problem Relation Age of Onset  . Lung cancer Mother   . Colitis Mother   . Heart  disease Mother   . Hypothyroidism Mother   . Lung cancer Father   . Heart disease Father   . Cancer Other   . Ulcerative colitis Other   . Alzheimer's disease Maternal Grandmother   . Heart attack Maternal Grandfather   . Heart attack Paternal Grandfather   . Breast cancer Maternal Aunt   . Alzheimer's disease Maternal Aunt   . Stroke Paternal Aunt   . Healthy Sister        x3  . Healthy Daughter        x3  . Graves' disease Son     Social History   Socioeconomic History  . Marital status: Married    Spouse name: Not on file  . Number of children: Not on file  . Years of education: Not on file  . Highest education level: Not on file  Occupational History  . Not on file  Social Needs  .  Financial resource strain: Not on file  . Food insecurity:    Worry: Not on file    Inability: Not on file  . Transportation needs:    Medical: Not on file    Non-medical: Not on file  Tobacco Use  . Smoking status: Never Smoker  . Smokeless tobacco: Never Used  Substance and Sexual Activity  . Alcohol use: No    Comment: rare occ.  . Drug use: No  . Sexual activity: Yes  Lifestyle  . Physical activity:    Days per week: Not on file    Minutes per session: Not on file  . Stress: Not on file  Relationships  . Social connections:    Talks on phone: Not on file    Gets together: Not on file    Attends religious service: Not on file    Active member of club or organization: Not on file    Attends meetings of clubs or organizations: Not on file    Relationship status: Not on file  Other Topics Concern  . Not on file  Social History Narrative  . Not on file   Review of Systems - See HPI.  All other ROS are negative.  BP 112/80   Pulse 74   Temp 97.9 F (36.6 C) (Oral)   Resp 16   Ht '5\' 7"'$  (1.702 m)   Wt 210 lb (95.3 kg)   SpO2 94%   BMI 32.89 kg/m   Physical Exam  Constitutional: She appears well-developed and well-nourished.  HENT:  Head: Normocephalic and atraumatic.  Eyes: Conjunctivae are normal.  Cardiovascular: Normal rate, regular rhythm, normal heart sounds and intact distal pulses.  Pulmonary/Chest: Effort normal and breath sounds normal. No stridor. No respiratory distress. She has no wheezes. She has no rales. She exhibits no tenderness.  Skin: Skin is warm.  Nursing note and vitals reviewed.  Assessment/Plan: Diabetes mellitus type II, controlled Repeat labs today -- A1C, CMP, Lipids. Continue current regimen for now. Patient to schedule eye examination. Foot exam up-to-date. Immunizations are up-to-date. Will alter regimen according to results.   Hyperlipidemia associated with type 2 diabetes mellitus ([place]) Taking medication as directed. Repeat labs  today. Discussed proper diet and exercise regimen.  Hypothyroidism Not taking medication daily as directed. Discussed importance of this. Recommend she set an alarm daily. Will check TSH level today.  ADD (attention deficit disorder) Doing well. Continue current regimen.   UTI symptoms Resolving symptoms. Urine dip with trace LE. Will check culture before deciding on ongoing treatment.  - POCT  Urinalysis Dipstick - Urine Culture  Breast cancer screening Order placed for screening mammogram.  - MM DIGITAL SCREENING BILATERAL; Future - MM DIGITAL SCREENING BILATERAL; Future  Immunity status testing MMR needed.  - Measles/Mumps/Rubella Immunity   [name], PA-C

## 2018-02-20 ENCOUNTER — Other Ambulatory Visit: Payer: Self-pay

## 2018-02-20 ENCOUNTER — Telehealth: Payer: Self-pay

## 2018-02-20 DIAGNOSIS — F909 Attention-deficit hyperactivity disorder, unspecified type: Secondary | ICD-10-CM

## 2018-02-20 LAB — MEASLES/MUMPS/RUBELLA IMMUNITY
Mumps IgG: >300 AU/mL
RUBELLA: 8.96 {index}

## 2018-02-20 MED ORDER — AMPHETAMINE-DEXTROAMPHETAMINE 20 MG PO TABS: 20.0000 mg | ORAL_TABLET | Freq: Two times a day (BID) | ORAL | 0 refills | Status: DC

## 2018-02-20 MED ORDER — CYANOCOBALAMIN 1000 MCG/ML IJ SOLN: INTRAMUSCULAR | 4 refills | Status: DC

## 2018-02-20 MED ORDER — EZETIMIBE 10 MG PO TABS: 10.0000 mg | ORAL_TABLET | Freq: Every day | ORAL | 0 refills | Status: DC

## 2018-02-20 MED FILL — EZETIMIBE 10 MG TABLET: 10 | 30 days supply | Qty: 30 | Fill #0

## 2018-02-20 MED FILL — CYANOCOBALAMIN 1,000 MCG/ML: 1000 | 30 days supply | Qty: 1 | Fill #0

## 2018-02-20 NOTE — Telephone Encounter (Signed)
Medication is pending for you

## 2018-02-20 NOTE — Telephone Encounter (Signed)
Called patient and she requested Adderall refill to be sent to [facility].  Please see note from [facility] from result note:  Dr. Birdie Riddle has been filling her Adderall to mail-order pharmacy. I will forward this to her to see if she is still willing to refill.   Last OV 02/19/18, No future OV yet  Last filled 09/19/17, #180 with 0 refills

## 2018-02-20 NOTE — Addendum Note (Signed)
Addended by: Midge Minium on: 02/20/2018 01:06 PM   Modules accepted: Orders

## 2018-02-20 NOTE — Telephone Encounter (Signed)
[place] for #[id number].  Please pend for me to sign

## 2018-02-22 ENCOUNTER — Other Ambulatory Visit: Payer: Self-pay

## 2018-02-22 LAB — URINE CULTURE
MICRO NUMBER: 90525349
SPECIMEN QUALITY: ADEQUATE

## 2018-02-22 MED ORDER — AMOXICILLIN-POT CLAVULANATE 500-125 MG PO TABS: 1.0000 | ORAL_TABLET | Freq: Two times a day (BID) | ORAL | 0 refills | Status: DC

## 2018-02-26 ENCOUNTER — Other Ambulatory Visit: Payer: Self-pay | Admitting: Physician Assistant

## 2018-03-05 ENCOUNTER — Other Ambulatory Visit: Payer: Self-pay | Admitting: Physician Assistant

## 2018-03-05 DIAGNOSIS — Z1231 Encounter for screening mammogram for malignant neoplasm of breast: Secondary | ICD-10-CM

## 2018-03-08 ENCOUNTER — Other Ambulatory Visit: Payer: Self-pay | Admitting: Physician Assistant

## 2018-03-20 ENCOUNTER — Encounter: Payer: Self-pay | Admitting: Physician Assistant

## 2018-03-21 ENCOUNTER — Ambulatory Visit: Payer: 59 | Admitting: Physician Assistant

## 2018-03-21 ENCOUNTER — Ambulatory Visit
Admission: RE | Admit: 2018-03-21 | Discharge: 2018-03-21 | Disposition: A | Discharge status: Home / Self Care | Payer: 59 | Source: Ambulatory Visit | Attending: Physician Assistant | Admitting: Physician Assistant

## 2018-03-21 DIAGNOSIS — Z1231 Encounter for screening mammogram for malignant neoplasm of breast: Secondary | ICD-10-CM

## 2018-03-28 ENCOUNTER — Ambulatory Visit (INDEPENDENT_AMBULATORY_CARE_PROVIDER_SITE_OTHER): Payer: 59 | Admitting: Physician Assistant

## 2018-03-28 ENCOUNTER — Other Ambulatory Visit: Payer: Self-pay | Admitting: Emergency Medicine

## 2018-03-28 ENCOUNTER — Encounter: Payer: Self-pay | Admitting: Physician Assistant

## 2018-03-28 ENCOUNTER — Other Ambulatory Visit: Payer: Self-pay

## 2018-03-28 ENCOUNTER — Telehealth: Payer: Self-pay | Admitting: Physician Assistant

## 2018-03-28 VITALS — BP 118/72 | HR 95 | Resp 16 | Ht 67.0 in | Wt 208.0 lb

## 2018-03-28 DIAGNOSIS — R300 Dysuria: Secondary | ICD-10-CM | POA: Diagnosis not present

## 2018-03-28 DIAGNOSIS — E538 Deficiency of other specified B group vitamins: Secondary | ICD-10-CM

## 2018-03-28 DIAGNOSIS — J3489 Other specified disorders of nose and nasal sinuses: Secondary | ICD-10-CM

## 2018-03-28 LAB — POCT URINALYSIS DIPSTICK
Blood, UA: NEGATIVE
GLUCOSE UA: NEGATIVE
KETONES UA: NEGATIVE
Leukocytes, UA: NEGATIVE
Nitrite, UA: NEGATIVE
Protein, UA: POSITIVE — AB
Urobilinogen, UA: 0.2 E.U./dL
pH, UA: 5.5 (ref 5.0–8.0)

## 2018-03-28 MED ORDER — CYANOCOBALAMIN 1000 MCG/ML IJ SOLN: INTRAMUSCULAR | 2 refills | Status: DC

## 2018-03-28 MED ORDER — MUPIROCIN CALCIUM 2 % NA OINT: 1.0000 "application " | TOPICAL_OINTMENT | Freq: Two times a day (BID) | NASAL | 0 refills | Status: DC

## 2018-03-28 NOTE — Telephone Encounter (Signed)
Copied from [place] [id number]. Topic: Quick Communication - See Telephone Encounter >> Mar 28, 2018  2:19 PM Percell Belt A wrote: CRM for notification. See Telephone encounter for: 03/28/18.  [facility] called in and state that the mupirocin nasal ointment (BACTROBAN) 2 % [[id number]] is to high of a copay and would like to know if the topical ointment.  The med is $170.00  [phone]

## 2018-03-28 NOTE — Telephone Encounter (Signed)
See CRM and Advise.   Doloris Hall,  LPN

## 2018-03-28 NOTE — Patient Instructions (Signed)
We will hold off on antibiotic giving negative urine dip, until culture results. I am concerned there may be some mild interstitial cystitis present.  Please follow the recommendations below to see if this helps.  We may need Urology input if not improving and culture negative.   Please use the nasal mupirocin as directed.    Interstitial Cystitis Interstitial cystitis is a condition that causes inflammation of the bladder. The bladder is a hollow organ in the lower part of your abdomen. It stores urine after the urine is made by your kidneys. With interstitial cystitis, you may have pain in the bladder area. You may also have a frequent and urgent need to urinate. The severity of interstitial cystitis can vary from person to person. You may have flare-ups of the condition, and then it may go away for a while. For many people who have this condition, it becomes a long-term problem. What are the causes? The cause of this condition is not known. What increases the risk? This condition is more likely to develop in women. What are the signs or symptoms? Symptoms of interstitial cystitis vary, and they can change over time. Symptoms may include:  Discomfort or pain in the bladder area. This can range from mild to severe. The pain may change in intensity as the bladder fills with urine or as it empties.  Pelvic pain.  An urgent need to urinate.  Frequent urination.  Pain during sexual intercourse.  Pinpoint bleeding on the bladder wall.  For women, the symptoms often get worse during menstruation. How is this diagnosed? This condition is diagnosed by evaluating your symptoms and ruling out other causes. A physical exam will be done. Various tests may be done to rule out other conditions. Common tests include:  Urine tests.  Cystoscopy. In this test, a tool that is like a very thin telescope is used to look into your bladder.  Biopsy. This involves taking a sample of tissue from the  bladder wall to be examined under a microscope.  How is this treated? There is no cure for interstitial cystitis, but treatment methods are available to control your symptoms. Work closely with your health care provider to find the treatments that will be most effective for you. Treatment options may include:  Medicines to relieve pain and to help reduce the number of times that you feel the need to urinate.  Bladder training. This involves learning ways to control when you urinate, such as: ? Urinating at scheduled times. ? Training yourself to delay urination. ? Doing exercises (Kegel exercises) to strengthen the muscles that control urine flow.  Lifestyle changes, such as changing your diet or taking steps to control stress.  Use of a device that provides electrical stimulation in order to reduce pain.  A procedure that stretches your bladder by filling it with air or fluid.  Surgery. This is rare. It is only done for extreme cases if other treatments do not help.  Follow these instructions at home:  Take medicines only as directed by your health care provider.  Use bladder training techniques as directed. ? Keep a bladder diary to find out which foods, liquids, or activities make your symptoms worse. ? Use your bladder diary to schedule bathroom trips. If you are away from home, plan to be near a bathroom at each of your scheduled times. ? Make sure you urinate just before you leave the house and just before you go to bed.  Do Kegel exercises as directed  by your health care provider.  Do not drink alcohol.  Do not use any tobacco products, including cigarettes, chewing tobacco, or electronic cigarettes. If you need help quitting, ask your health care provider.  Make dietary changes as directed by your health care provider. You may need to avoid spicy foods and foods that contain a high amount of potassium.  Limit your drinking of beverages that stimulate urination. These  include soda, coffee, and tea.  Keep all follow-up visits as directed by your health care provider. This is important. Contact a health care provider if:  Your symptoms do not get better after treatment.  Your pain and discomfort are getting worse.  You have more frequent urges to urinate.  You have a fever. Get help right away if:  You are not able to control your bladder at all. This information is not intended to replace advice given to you by your health care provider. Make sure you discuss any questions you have with your health care provider. Document Released: 06/09/2004 Document Revised: 03/16/2016 Document Reviewed: 06/16/2014 Elsevier Interactive Patient Education  [employer].

## 2018-03-28 NOTE — Progress Notes (Signed)
Patient presents to clinic today to discuss ongoing urinary symptoms. Patient with history of UTI, most recently 3 months ago, was treated based on culture results. Patient notes that dysuria resolved but still having the occasional frequency, urgency and sensation of incomplete bladder emptying. Notes occasional bladder pain but no true dysuria. Denies nausea/vomiting, fever, chills, hematuria. Denies change to bowel habits. Denies change to diet and tries to keep well-hydrated.  Patient also noted tenderness, redness and swelling in her L nares. Would like this assessed today.   Past Medical History:  Diagnosis Date  . 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 Chicken pox   . Diabetes mellitus    Resolved w/Gastric Sleeve  . GERD (gastroesophageal reflux disease)   . History of kidney stones   . Hyperlipidemia    Had Gastric Sleeve  . Hypothyroidism   . MVP (mitral valve prolapse)    hx. of [>30 yrs]  . Obesity    Had Gastric Sleeve  . Thyroid disease     Current Outpatient Medications on File Prior to Visit  Medication Sig Dispense Refill  . amitriptyline (ELAVIL) 10 MG tablet TAKE 1 TO 3 TABLETS AT  BEDTIME AS NEEDED 270 tablet 1  . amphetamine-dextroamphetamine (ADDERALL) 20 MG tablet Take 1 tablet (20 mg total) by mouth 2 (two) times daily. 180 tablet 0  . aspirin 81 MG tablet Take 325 mg by mouth daily.     . Calcium Carb-Cholecalciferol (CALCIUM-VITAMIN D3) 600-400 MG-UNIT CAPS Take 2 tablets daily as directed 60 capsule 5  . celecoxib (CELEBREX) 100 MG capsule TAKE 1 CAPSULE BY MOUTH TWO TIMES DAILY 180 capsule 1  . cyanocobalamin (,VITAMIN B-12,) 1000 MCG/ML injection INJECT 1000MCG INTO THE SKIN MONTHLY. 1 mL 4  . ezetimibe (ZETIA) 10 MG tablet Take 1 tablet (10 mg total) by mouth daily. 30 tablet 0  . metFORMIN (GLUCOPHAGE) 500 MG tablet TAKE 1 TABLET EACH AM AND 2 TABLETS EACH PM 270 tablet 1  . ondansetron (ZOFRAN) 4 MG tablet TAKE 1 TABLET BY MOUTH  EVERY 8 HOURS AS NEEDED FOR  NAUSEA AND VOMITING 90 tablet 0  . pantoprazole (PROTONIX) 40 MG tablet TAKE 1 TABLET BY MOUTH  DAILY 90 tablet 1  . SYNTHROID 175 MCG tablet TAKE 1 TABLET BY MOUTH  DAILY BEFORE BREAKFAST 90 tablet 0   No current facility-administered medications on file prior to visit.     Allergies  Allergen Reactions  . Codeine Nausea And Vomiting  . Morphine And Related Nausea And Vomiting    Family History  Problem Relation Age of Onset  . Lung cancer Mother   . Colitis Mother   . Heart disease Mother   . Hypothyroidism Mother   . Lung cancer Father   . Heart disease Father   . Cancer Other   . Ulcerative colitis Other   . Alzheimer's disease Maternal Grandmother   . Heart attack Maternal Grandfather   . Heart attack Paternal Grandfather   . Breast cancer Maternal Aunt   . Alzheimer's disease Maternal Aunt   . Stroke Paternal Aunt   . Healthy Sister        x3  . Healthy Daughter        x3  . Graves' disease Son     Social History   Socioeconomic History  . Marital status: Married    Spouse name: Not on file  . Number of children: Not on file  . Years of education: Not on file  .  Highest education level: Not on file  Occupational History  . Not on file  Social Needs  . Financial resource strain: Not on file  . Food insecurity:    Worry: Not on file    Inability: Not on file  . Transportation needs:    Medical: Not on file    Non-medical: Not on file  Tobacco Use  . Smoking status: Never Smoker  . Smokeless tobacco: Never Used  Substance and Sexual Activity  . Alcohol use: No    Comment: rare occ.  . Drug use: No  . Sexual activity: Yes  Lifestyle  . Physical activity:    Days per week: Not on file    Minutes per session: Not on file  . Stress: Not on file  Relationships  . Social connections:    Talks on phone: Not on file    Gets together: Not on file    Attends religious service: Not on file    Active member of club or organization: Not on file     Attends meetings of clubs or organizations: Not on file    Relationship status: Not on file  Other Topics Concern  . Not on file  Social History Narrative  . Not on file   Review of Systems - See HPI.  All other ROS are negative.  BP 118/72   Pulse 95   Resp 16   Ht 5\' 7"  (1.702 m)   Wt 208 lb (94.3 kg)   SpO2 99%   BMI 32.58 kg/m   Physical Exam  Constitutional: She is oriented to person, place, and time. She appears well-developed and well-nourished.  HENT:  Head: Normocephalic and atraumatic.  Nose: Sinus tenderness present.    Eyes: Conjunctivae are normal.  Cardiovascular: Normal rate, regular rhythm, normal heart sounds and intact distal pulses.  Pulmonary/Chest: Effort normal.  Abdominal: Soft. Bowel sounds are normal. She exhibits no distension and no mass. There is no tenderness. There is no rebound and no guarding.  Negative CVA tenderness  Neurological: She is alert and oriented to person, place, and time.  Vitals reviewed.  Recent Results (from the past 2160 hour(s))  POCT Urinalysis Dipstick     Status: Abnormal   Collection Time: 02/19/18  8:36 AM  Result Value Ref Range   Color, UA Yellow    Clarity, UA Clear    Glucose, UA Negative    Bilirubin, UA 1(17) +    Ketones, UA Negative    Spec Grav, UA 1.015 1.010 - 1.025   Blood, UA 5-10    pH, UA 5.0 5.0 - 8.0   Protein, UA 15 (0.15)    Urobilinogen, UA 0.2 0.2 or 1.0 E.U./dL   Nitrite, UA Negative    Leukocytes, UA Small (1+) (A) Negative   Appearance     Odor    Urine Culture     Status: Abnormal   Collection Time: 02/19/18  8:40 AM  Result Value Ref Range   MICRO NUMBER: [id number]    SPECIMEN QUALITY: ADEQUATE    Sample Source NOT GIVEN    STATUS: FINAL    ISOLATE 1: Escherichia coli (A)     Comment: Greater than 100,000 CFU/mL of Escherichia coli      Susceptibility   Escherichia coli - URINE CULTURE, REFLEX    AMOX/CLAVULANIC <=2 Sensitive     AMPICILLIN <=2 Sensitive      AMPICILLIN/SULBACTAM <=2 Sensitive     CEFAZOLIN* <=4 Not Reportable      *  For infections other than uncomplicated UTIcaused by E. coli, K. pneumoniae or P. mirabilis:Cefazolin is resistant if MIC > or = 8 mcg/mL.(Distinguishing susceptible versus intermediatefor isolates with MIC < or = 4 mcg/mL requiresadditional testing.)For uncomplicated UTI caused by E. coli,K. pneumoniae or P. mirabilis: Cefazolin issusceptible if MIC <32 mcg/mL and predictssusceptible to the oral agents cefaclor, cefdinir,cefpodoxime, cefprozil, cefuroxime, cephalexinand loracarbef.    CEFEPIME <=1 Sensitive     CEFTRIAXONE <=1 Sensitive     CIPROFLOXACIN <=0.25 Sensitive     LEVOFLOXACIN <=0.12 Sensitive     ERTAPENEM <=0.5 Sensitive     GENTAMICIN <=1 Sensitive     IMIPENEM <=0.25 Sensitive     NITROFURANTOIN <=16 Sensitive     PIP/TAZO <=4 Sensitive     TOBRAMYCIN <=1 Sensitive     TRIMETH/SULFA* <=20 Sensitive      * For infections other than uncomplicated UTIcaused by E. coli, K. pneumoniae or P. mirabilis:Cefazolin is resistant if MIC > or = 8 mcg/mL.(Distinguishing susceptible versus intermediatefor isolates with MIC < or = 4 mcg/mL requiresadditional testing.)For uncomplicated UTI caused by E. coli,K. pneumoniae or P. mirabilis: Cefazolin issusceptible if MIC <32 mcg/mL and predictssusceptible to the oral agents cefaclor, cefdinir,cefpodoxime, cefprozil, cefuroxime, cephalexinand loracarbef.Legend:S = Susceptible  I = IntermediateR = Resistant  NS = Not susceptible* = Not tested  NR = Not reported**NN = See antimicrobic comments  Urine Microalbumin w/creat. ratio     Status: Abnormal   Collection Time: 02/19/18  8:40 AM  Result Value Ref Range   Microalb, Ur 2.1 (H) 0.0 - 1.9 mg/dL   Creatinine,U 137.8 mg/dL   Microalb Creat Ratio 1.5 0.0 - 30.0 mg/g  Comprehensive metabolic panel     Status: Abnormal   Collection Time: 02/19/18  8:40 AM  Result Value Ref Range   Sodium 138 135 - 145 mEq/L   Potassium 4.2 3.5  - 5.1 mEq/L   Chloride 103 96 - 112 mEq/L   CO2 28 19 - 32 mEq/L   Glucose, Bld 126 (H) 70 - 99 mg/dL   BUN 11 6 - 23 mg/dL   Creatinine, Ser 0.83 0.40 - 1.20 mg/dL   Total Bilirubin 0.6 0.2 - 1.2 mg/dL   Alkaline Phosphatase 45 39 - 117 U/L   AST 13 0 - 37 U/L   ALT 12 0 - 35 U/L   Total Protein 6.5 6.0 - 8.3 g/dL   Albumin 3.8 3.5 - 5.2 g/dL   Calcium 9.2 8.4 - 10.5 mg/dL   GFR 74.98 >60.00 mL/min  Lipid panel     Status: Abnormal   Collection Time: 02/19/18  8:40 AM  Result Value Ref Range   Cholesterol 223 (H) 0 - 200 mg/dL    Comment: ATP III Classification       Desirable:  < 200 mg/dL               Borderline High:  200 - 239 mg/dL          High:  > = 240 mg/dL   Triglycerides 84.0 0.0 - 149.0 mg/dL    Comment: Normal:  <150 mg/dLBorderline High:  150 - 199 mg/dL   HDL 70.30 >39.00 mg/dL   VLDL 16.8 0.0 - 40.0 mg/dL   LDL Cholesterol 136 (H) 0 - 99 mg/dL   Total CHOL/HDL Ratio 3     Comment:                Men          Women1/2  Average Risk     3.4          3.3Average Risk          5.0          4.42X Average Risk          9.6          7.13X Average Risk          15.0          11.0                       NonHDL 152.30     Comment: NOTE:  Non-HDL goal should be 30 mg/dL higher than patient's LDL goal (i.e. LDL goal of < 70 mg/dL, would have non-HDL goal of < 100 mg/dL)  Hemoglobin A1c     Status: Abnormal   Collection Time: 02/19/18  8:40 AM  Result Value Ref Range   Hgb A1c MFr Bld 7.3 (H) 4.6 - 6.5 %    Comment: Glycemic Control Guidelines for People with Diabetes:Non Diabetic:  <6%Goal of Therapy: <7%Additional Action Suggested:  >8%   TSH     Status: None   Collection Time: 02/19/18  8:40 AM  Result Value Ref Range   TSH 3.01 0.35 - 4.50 uIU/mL  B12     Status: None   Collection Time: 02/19/18  8:40 AM  Result Value Ref Range   Vitamin B-12 319 211 - 911 pg/mL  Measles/Mumps/Rubella Immunity     Status: None   Collection Time: 02/19/18  8:40 AM  Result Value Ref  Range   Rubeola IgG >300.00 AU/mL    Comment: AU/mL            Interpretation -----            -------------- <25.00           Negative 25.00-29.99      Equivocal >29.99           Positive . A positive result indicates that the patient has antibody to measles virus. It does not differentiate  between an active or past infection. The clinical  diagnosis must be interpreted in conjunction with  clinical signs and symptoms of the patient.    Mumps IgG >300.00 AU/mL    Comment:  AU/mL           Interpretation -------         ---------------- <9.00             Negative 9.00-10.99        Equivocal >10.99            Positive A positive result indicates that the patient has  antibody to mumps virus. It does not differentiate between an  active or past infection. The clinical diagnosis must be interpreted in conjunction with clinical signs and symptoms of the patient. .    Rubella 8.96 index    Comment:     Index            Interpretation     -----            --------------       <0.90            Not consistent with Immunity     0.90-0.99        Equivocal     > or = 1.00      Consistent with Immunity  . The presence of rubella IgG antibody  suggests  immunization or past or current infection with rubella virus.   POCT Urinalysis Dipstick     Status: Abnormal   Collection Time: 03/28/18 10:48 AM  Result Value Ref Range   Color, UA dark yellow    Clarity, UA cloudy    Glucose, UA Negative Negative   Bilirubin, UA 1+    Ketones, UA negative    Spec Grav, UA >=1.030 (A) 1.010 - 1.025   Blood, UA negative    pH, UA 5.5 5.0 - 8.0   Protein, UA Positive (A) Negative   Urobilinogen, UA 0.2 0.2 or 1.0 E.U./dL   Nitrite, UA negative    Leukocytes, UA Negative Negative   Appearance     Odor      Assessment/Plan: 1. Dysuria Urine dip negative for LE or nitrites. Trace protein noted. Will send for culture. Giving ongoing symptoms there is concern for OAB versus some non-infectious  cystitis. Patient does not want to try medications for OAB. Wills tart supportive measures for IC until culture results. Will alter regimen based on these results. Discussed possible need for Urology referral.  - POCT Urinalysis Dipstick - Urine Culture  2. Nasal vestibulitis Start Bactroban. Supportive measures reviewed. Call if not resolving.  - mupirocin nasal ointment (BACTROBAN) 2 %; Place 1 application into the nose 2 (two) times daily. Use one-half of tube in each nostril twice daily for five (5) days. After application, press sides of nose together and gently massage.  Dispense: 10 g; Refill: 0   [name], PA-C

## 2018-03-29 ENCOUNTER — Other Ambulatory Visit: Payer: Self-pay | Admitting: Physician Assistant

## 2018-03-29 ENCOUNTER — Encounter: Payer: Self-pay | Admitting: Physician Assistant

## 2018-03-29 MED ORDER — MUPIROCIN CALCIUM 2 % EX CREA: 1.0000 "application " | TOPICAL_CREAM | Freq: Two times a day (BID) | CUTANEOUS | 0 refills | Status: DC

## 2018-03-29 NOTE — Telephone Encounter (Signed)
Would be helpful if the CRM was complete.   I am assuming she is wondering if the regular topical ointment would be cheaper.  Can we please check with her pharmacy to see the pricing difference so we can get a medication called in for her?

## 2018-03-31 LAB — URINE CULTURE
MICRO NUMBER: 90683900
SPECIMEN QUALITY:: ADEQUATE

## 2018-04-03 ENCOUNTER — Encounter: Payer: Self-pay | Admitting: Physician Assistant

## 2018-04-03 MED ORDER — FLUCONAZOLE 150 MG PO TABS: 150.0000 mg | ORAL_TABLET | Freq: Once | ORAL | 0 refills | Status: AC

## 2018-04-03 MED ORDER — CEFDINIR 300 MG PO CAPS: 300.0000 mg | ORAL_CAPSULE | Freq: Two times a day (BID) | ORAL | 0 refills | Status: DC

## 2018-04-03 MED FILL — CEFDINIR 300 MG CAPS: 300 | 10 days supply | Qty: 20 | Fill #0

## 2018-04-03 MED FILL — FLUCONAZOLE 150 MG TABS: 150 | 1 days supply | Qty: 1 | Fill #0

## 2018-05-09 ENCOUNTER — Other Ambulatory Visit: Payer: Self-pay | Admitting: Physician Assistant

## 2018-05-19 ENCOUNTER — Encounter: Payer: Self-pay | Admitting: Physician Assistant

## 2018-05-19 ENCOUNTER — Other Ambulatory Visit: Payer: Self-pay | Admitting: Family Medicine

## 2018-05-19 ENCOUNTER — Other Ambulatory Visit: Payer: Self-pay | Admitting: Physician Assistant

## 2018-05-19 DIAGNOSIS — F909 Attention-deficit hyperactivity disorder, unspecified type: Secondary | ICD-10-CM

## 2018-05-20 MED ORDER — EZETIMIBE 10 MG PO TABS: 10.0000 mg | ORAL_TABLET | Freq: Every day | ORAL | 1 refills | Status: DC

## 2018-05-20 NOTE — Telephone Encounter (Signed)
Last OV 03/28/18 adderall last filled 02/20/18 #180 with 0

## 2018-05-20 NOTE — Telephone Encounter (Signed)
Adderall last rx was 02/20/18 #180

## 2018-05-21 MED ORDER — AMPHETAMINE-DEXTROAMPHETAMINE 20 MG PO TABS: 20.0000 mg | ORAL_TABLET | Freq: Two times a day (BID) | ORAL | 0 refills | Status: DC

## 2018-05-21 MED ORDER — AMITRIPTYLINE HCL 10 MG PO TABS: ORAL_TABLET | ORAL | 1 refills | Status: DC

## 2018-05-21 NOTE — Telephone Encounter (Signed)
Dr. Jonni Sanger,   Dr. Birdie Riddle usually fills 90-day script of the Adderall for my patient to go to mail-order pharmacy (due to cost and insurance requirements). Are you comfortable doing in her absence?

## 2018-05-21 NOTE — Telephone Encounter (Signed)
Sure. I sent it in.

## 2018-05-22 ENCOUNTER — Other Ambulatory Visit: Payer: Self-pay | Admitting: Physician Assistant

## 2018-07-29 ENCOUNTER — Other Ambulatory Visit: Payer: Self-pay | Admitting: Physician Assistant

## 2018-08-14 ENCOUNTER — Encounter: Payer: Self-pay | Admitting: Internal Medicine

## 2018-08-18 ENCOUNTER — Other Ambulatory Visit: Payer: Self-pay | Admitting: Physician Assistant

## 2018-09-18 ENCOUNTER — Other Ambulatory Visit: Payer: Self-pay | Admitting: Physician Assistant

## 2018-09-25 ENCOUNTER — Encounter: Payer: Self-pay | Admitting: Internal Medicine

## 2018-10-01 ENCOUNTER — Ambulatory Visit (AMBULATORY_SURGERY_CENTER): Payer: Self-pay

## 2018-10-01 ENCOUNTER — Encounter: Payer: Self-pay | Admitting: Internal Medicine

## 2018-10-01 VITALS — Ht 68.0 in | Wt 208.0 lb

## 2018-10-01 DIAGNOSIS — Z86010 Personal history of colonic polyps: Secondary | ICD-10-CM

## 2018-10-01 MED ORDER — NA SULFATE-K SULFATE-MG SULF 17.5-3.13-1.6 GM/177ML PO SOLN: 1.0000 | Freq: Once | ORAL | 0 refills | Status: AC

## 2018-10-01 MED FILL — SUPREP BOWEL PREP KIT: 17.5-3.13-1 | 1 days supply | Qty: 354 | Fill #0

## 2018-10-01 NOTE — Progress Notes (Signed)
Denies allergies to eggs or soy products. Denies complication of anesthesia or sedation. Denies use of weight loss medication. Denies use of O2.   Emmi instructions declined.  

## 2018-10-03 ENCOUNTER — Ambulatory Visit (INDEPENDENT_AMBULATORY_CARE_PROVIDER_SITE_OTHER): Payer: 59 | Admitting: Physician Assistant

## 2018-10-03 ENCOUNTER — Encounter: Payer: Self-pay | Admitting: Physician Assistant

## 2018-10-03 ENCOUNTER — Other Ambulatory Visit: Payer: Self-pay | Admitting: Emergency Medicine

## 2018-10-03 ENCOUNTER — Other Ambulatory Visit: Payer: Self-pay

## 2018-10-03 VITALS — BP 110/78 | HR 94 | Temp 98.0°F | Resp 14 | Ht 67.0 in | Wt 206.0 lb

## 2018-10-03 DIAGNOSIS — E038 Other specified hypothyroidism: Secondary | ICD-10-CM

## 2018-10-03 DIAGNOSIS — E785 Hyperlipidemia, unspecified: Secondary | ICD-10-CM | POA: Diagnosis not present

## 2018-10-03 DIAGNOSIS — E119 Type 2 diabetes mellitus without complications: Secondary | ICD-10-CM

## 2018-10-03 DIAGNOSIS — E1169 Type 2 diabetes mellitus with other specified complication: Secondary | ICD-10-CM

## 2018-10-03 DIAGNOSIS — F909 Attention-deficit hyperactivity disorder, unspecified type: Secondary | ICD-10-CM

## 2018-10-03 DIAGNOSIS — R300 Dysuria: Secondary | ICD-10-CM

## 2018-10-03 LAB — COMPREHENSIVE METABOLIC PANEL
ALT: 11 U/L (ref 0–35)
AST: 13 U/L (ref 0–37)
Albumin: 4.1 g/dL (ref 3.5–5.2)
Alkaline Phosphatase: 43 U/L (ref 39–117)
BUN: 12 mg/dL (ref 6–23)
CO2: 27 mEq/L (ref 19–32)
Calcium: 9.7 mg/dL (ref 8.4–10.5)
Chloride: 103 mEq/L (ref 96–112)
Creatinine, Ser: 0.9 mg/dL (ref 0.40–1.20)
GFR: 68.15 mL/min (ref >60.00)
Glucose, Bld: 223 mg/dL — ABNORMAL HIGH (ref 70–99)
Potassium: 3.9 mEq/L (ref 3.5–5.1)
Sodium: 137 mEq/L (ref 135–145)
Total Bilirubin: 0.5 mg/dL (ref 0.2–1.2)
Total Protein: 6.7 g/dL (ref 6.0–8.3)

## 2018-10-03 LAB — CBC WITH DIFFERENTIAL/PLATELET
Basophils Absolute: 0.1 10*3/uL (ref 0.0–0.1)
Basophils Relative: 1.2 % (ref 0.0–3.0)
Eosinophils Absolute: 0.4 10*3/uL (ref 0.0–0.7)
Eosinophils Relative: 7.7 % — ABNORMAL HIGH (ref 0.0–5.0)
HCT: 35 % — ABNORMAL LOW (ref 36.0–46.0)
Hemoglobin: 11.9 g/dL — ABNORMAL LOW (ref 12.0–15.0)
Lymphocytes Relative: 31.5 % (ref 12.0–46.0)
Lymphs Abs: 1.8 10*3/uL (ref 0.7–4.0)
MCHC: 34 g/dL (ref 30.0–36.0)
MCV: 87.7 fl (ref 78.0–100.0)
Monocytes Absolute: 0.4 10*3/uL (ref 0.1–1.0)
Monocytes Relative: 6.4 % (ref 3.0–12.0)
Neutro Abs: 3 10*3/uL (ref 1.4–7.7)
Neutrophils Relative %: 53.2 % (ref 43.0–77.0)
Platelets: 215 10*3/uL (ref 150.0–400.0)
RBC: 3.99 Mil/uL (ref 3.87–5.11)
RDW: 13.2 % (ref 11.5–15.5)
WBC: 5.6 10*3/uL (ref 4.0–10.5)

## 2018-10-03 LAB — POCT URINALYSIS DIPSTICK
Bilirubin, UA: NEGATIVE
Blood, UA: NEGATIVE
Glucose, UA: NEGATIVE
Ketones, UA: NEGATIVE
Leukocytes, UA: NEGATIVE
Nitrite, UA: NEGATIVE
Protein, UA: NEGATIVE
Spec Grav, UA: >=1.03 — AB (ref 1.010–1.025)
Urobilinogen, UA: 0.2 E.U./dL
pH, UA: 5.5 (ref 5.0–8.0)

## 2018-10-03 LAB — LIPID PANEL
CHOLESTEROL: 165 mg/dL (ref 0–200)
HDL: 72.5 mg/dL (ref >39.00)
LDL Cholesterol: 75 mg/dL (ref 0–99)
NONHDL: 92.72
Total CHOL/HDL Ratio: 2
Triglycerides: 87 mg/dL (ref 0.0–149.0)
VLDL: 17.4 mg/dL (ref 0.0–40.0)

## 2018-10-03 LAB — HEMOGLOBIN A1C: Hgb A1c MFr Bld: 7.4 % — ABNORMAL HIGH (ref 4.6–6.5)

## 2018-10-03 LAB — TSH: TSH: 0.55 u[IU]/mL (ref 0.35–4.50)

## 2018-10-03 MED ORDER — EZETIMIBE 10 MG PO TABS: 10.0000 mg | ORAL_TABLET | Freq: Every day | ORAL | 1 refills | Status: DC

## 2018-10-03 MED ORDER — CITALOPRAM HYDROBROMIDE 10 MG PO TABS: 10.0000 mg | ORAL_TABLET | Freq: Every day | ORAL | 1 refills | Status: DC

## 2018-10-03 MED ORDER — AMITRIPTYLINE HCL 10 MG PO TABS: ORAL_TABLET | ORAL | 1 refills | Status: DC

## 2018-10-03 MED ORDER — METFORMIN HCL 500 MG PO TABS: ORAL_TABLET | ORAL | 1 refills | Status: DC

## 2018-10-03 MED ORDER — SYNTHROID 175 MCG PO TABS: ORAL_TABLET | ORAL | 1 refills | Status: DC

## 2018-10-03 MED ORDER — CELECOXIB 100 MG PO CAPS: ORAL_CAPSULE | ORAL | 1 refills | Status: DC

## 2018-10-03 MED ORDER — PANTOPRAZOLE SODIUM 40 MG PO TBEC: 40.0000 mg | DELAYED_RELEASE_TABLET | Freq: Every day | ORAL | 1 refills | Status: DC

## 2018-10-03 MED FILL — CITALOPRAM HBR 10 MG TABLET: 10 | 30 days supply | Qty: 30 | Fill #0

## 2018-10-03 NOTE — Progress Notes (Signed)
History of Present Illness: Patient is a 57 y.o. female who presents to clinic today for follow-up of Diabetes Mellitus II, controlled.  Patient currently on medication regimen of Metformin 500 mg AM and 1000 mg PM.   Endorses taking medications as directed. Endorses keeping a low-carb diet.  Denies vision changes. Is not currently checking blood glucose.   Latest Maintenance: A1C --  Lab Results  Component Value Date   HGBA1C 7.3 (H) 02/19/2018   Diabetic Eye Exam -- Scheduled for Wednesday at 1:30. Urine Microalbumin -- Due.  Foot Exam -- Due. Denies acute concerns today.   Anxiety -- Patient endorses increase in anxiety over the past few months secondary to job stressors. Is affecting sleep and appetite. Would like to discuss restarting her Citalopram each day. Does taken Amitriptyline at night to help with sleep but cannot tolerate higher doses of medication for anxiety. Denies SI/HI.  Patient endorses recurrence of urinary urgency and frequency. Notes occasional dysuria but none currently. Denies fever, chills, malaise or fatigue. Has history of UTI in the past. Denies any noted urge incontinence.   PAP -- no further per GYN s/p hysterectomy.  Colonoscopy -- Scheduled for Monday.  Past Medical History:  Diagnosis Date  . Chicken pox   . Depression   . Diabetes mellitus    Resolved w/Gastric Sleeve  . GERD (gastroesophageal reflux disease)   . Heart murmur   . History of kidney stones   . Hyperlipidemia    Had Gastric Sleeve  . Hypothyroidism   . MVP (mitral valve prolapse)    hx. of [>30 yrs]  . Neuromuscular disorder ([facility])    Neuropathy in both feet  . Obesity    Had Gastric Sleeve  . Thyroid disease     Current Outpatient Medications on File Prior to Visit  Medication Sig Dispense Refill  . amphetamine-dextroamphetamine (ADDERALL) 20 MG tablet Take 1 tablet (20 mg total) by mouth 2 (two) times daily. 180 tablet 0  . aspirin 81 MG tablet Take 325 mg by mouth  daily.     . cyanocobalamin (,VITAMIN B-12,) 1000 MCG/ML injection INJECT 1000MCG INTO THE SKIN MONTHLY. 30 mL 2  . mupirocin cream (BACTROBAN) 2 % Apply 1 application topically 2 (two) times daily. For 5 days. 15 g 0  . ondansetron (ZOFRAN) 4 MG tablet TAKE 1 TABLET BY MOUTH  EVERY 8 HOURS AS NEEDED FOR NAUSEA AND VOMITING 90 tablet 0   No current facility-administered medications on file prior to visit.     Allergies  Allergen Reactions  . Codeine Nausea And Vomiting  . Morphine And Related Nausea And Vomiting    Family History  Problem Relation Age of Onset  . Lung cancer Mother   . Colitis Mother   . Heart disease Mother   . Hypothyroidism Mother   . Lung cancer Father   . Heart disease Father   . Cancer Other   . Ulcerative colitis Other   . Alzheimer's disease Maternal Grandmother   . Heart attack Maternal Grandfather   . Heart attack Paternal Grandfather   . Breast cancer Maternal Aunt   . Alzheimer's disease Maternal Aunt   . Stroke Paternal Aunt   . Healthy Sister        x3  . Healthy Daughter        x3  . Graves' disease Son   . Colon cancer Neg Hx   . Esophageal cancer Neg Hx   . Rectal cancer Neg Hx   .  Stomach cancer Neg Hx     Social History   Socioeconomic History  . Marital status: Married    Spouse name: Not on file  . Number of children: Not on file  . Years of education: Not on file  . Highest education level: Not on file  Occupational History  . Not on file  Social Needs  . Financial resource strain: Not on file  . Food insecurity:    Worry: Not on file    Inability: Not on file  . Transportation needs:    Medical: Not on file    Non-medical: Not on file  Tobacco Use  . Smoking status: Never Smoker  . Smokeless tobacco: Never Used  Substance and Sexual Activity  . Alcohol use: No    Comment: rare occ.  . Drug use: No  . Sexual activity: Yes  Lifestyle  . Physical activity:    Days per week: Not on file    Minutes per session:  Not on file  . Stress: Not on file  Relationships  . Social connections:    Talks on phone: Not on file    Gets together: Not on file    Attends religious service: Not on file    Active member of club or organization: Not on file    Attends meetings of clubs or organizations: Not on file    Relationship status: Not on file  Other Topics Concern  . Not on file  Social History Narrative  . Not on file   Review of Systems: Pertinent ROS are listed in HPI  Physical Examination: BP 110/78   Pulse 94   Temp 98 F (36.7 C) (Oral)   Resp 14   Ht 5\' 7"  (1.702 m)   Wt 206 lb (93.4 kg)   BMI 32.26 kg/m  General appearance: alert, cooperative, appears stated age and no distress Lungs: clear to auscultation bilaterally Heart: regular rate and rhythm, S1, S2 normal, no murmur, click, rub or gallop Abdomen: soft, non-tender; bowel sounds normal; no masses,  no organomegaly Extremities: extremities normal, atraumatic, no cyanosis or edema Pulses: 2+ and symmetric Skin: Skin color, texture, turgor normal. No rashes or lesions Lymph nodes: Cervical, supraclavicular, and axillary nodes normal. Neurologic: Alert and oriented X 3, normal strength and tone. Normal symmetric reflexes. Normal coordination and gait  Diabetic Foot Exam - Simple   Simple Foot Form Diabetic Foot exam was performed with the following findings:  Yes 10/03/2018 10:43 AM  Visual Inspection No deformities, no ulcerations, no other skin breakdown bilaterally:  Yes Sensation Testing See comments:  Yes Pulse Check Posterior Tibialis and Dorsalis pulse intact bilaterally:  Yes Comments Decreased sensation noted of distal feet bilaterally -- unchanged from prior examinations.    Assessment/Plan: Diabetes mellitus type II, controlled Eye examination schedule. Foot exam updated. Immunizations up-to-date. Repeat labs today to include A1C, CMP and Lipids. Will alter regimen accordingly.   Hyperlipidemia associated with  type 2 diabetes mellitus ([place]) Repeat fasting labs today.  Hypothyroidism Taking medications as directed. Will check TSH today.  ADD (attention deficit disorder) Meds refilled.   Dysuria UA unremarkable. Will wait on urine culture before treatment. Supportive measures reviewed. If positive, will treat and refer to Urology due to issue with recurrent cystitis.

## 2018-10-03 NOTE — Assessment & Plan Note (Signed)
UA unremarkable. Will wait on urine culture before treatment. Supportive measures reviewed. If positive, will treat and refer to Urology due to issue with recurrent cystitis.

## 2018-10-03 NOTE — Patient Instructions (Signed)
Please go to the lab today for blood work.  I will call you with your results. We will alter treatment regimen(s) if indicated by your results.   Start the Citalopram 10 mg daily.  Continue other medications as directed.  We will call with your urine culture results.

## 2018-10-03 NOTE — Assessment & Plan Note (Signed)
Taking medications as directed. Will check TSH today.

## 2018-10-03 NOTE — Assessment & Plan Note (Signed)
Repeat fasting labs today.

## 2018-10-03 NOTE — Assessment & Plan Note (Signed)
Eye examination schedule. Foot exam updated. Immunizations up-to-date. Repeat labs today to include A1C, CMP and Lipids. Will alter regimen accordingly.

## 2018-10-03 NOTE — Assessment & Plan Note (Signed)
Meds refilled.

## 2018-10-04 ENCOUNTER — Encounter: Payer: Self-pay | Admitting: Physician Assistant

## 2018-10-04 DIAGNOSIS — E119 Type 2 diabetes mellitus without complications: Secondary | ICD-10-CM

## 2018-10-04 LAB — URINE CULTURE
MICRO NUMBER: 91491073
SPECIMEN QUALITY: ADEQUATE

## 2018-10-05 MED ORDER — SEMAGLUTIDE(0.25 OR 0.5MG/DOS) 2 MG/1.5ML ~~LOC~~ SOPN: 0.2500 mg | PEN_INJECTOR | SUBCUTANEOUS | 1 refills | Status: DC

## 2018-10-07 ENCOUNTER — Encounter: Payer: Self-pay | Admitting: Internal Medicine

## 2018-10-07 ENCOUNTER — Ambulatory Visit (AMBULATORY_SURGERY_CENTER): Payer: 59 | Admitting: Internal Medicine

## 2018-10-07 ENCOUNTER — Other Ambulatory Visit: Payer: Self-pay | Admitting: Physician Assistant

## 2018-10-07 VITALS — BP 122/68 | HR 70 | Temp 97.3°F | Resp 14 | Ht 68.0 in | Wt 208.0 lb

## 2018-10-07 DIAGNOSIS — Z86010 Personal history of colonic polyps: Secondary | ICD-10-CM | POA: Diagnosis present

## 2018-10-07 DIAGNOSIS — D122 Benign neoplasm of ascending colon: Secondary | ICD-10-CM | POA: Diagnosis not present

## 2018-10-07 DIAGNOSIS — F909 Attention-deficit hyperactivity disorder, unspecified type: Secondary | ICD-10-CM

## 2018-10-07 MED ORDER — SODIUM CHLORIDE 0.9 % IV SOLN: 500.0000 mL | Freq: Once | INTRAVENOUS | Status: DC

## 2018-10-07 MED ORDER — AMPHETAMINE-DEXTROAMPHETAMINE 20 MG PO TABS: 20.0000 mg | ORAL_TABLET | Freq: Two times a day (BID) | ORAL | 0 refills | Status: DC

## 2018-10-07 NOTE — Telephone Encounter (Signed)
Dr. Birdie Riddle -- are you willing to continue sending 90-day of Adderall to [facility] for the patient. She is up-to-date on appointments.

## 2018-10-07 NOTE — Progress Notes (Signed)
Called to room to assist during endoscopic procedure.  Patient ID and intended procedure confirmed with present staff. Received instructions for my participation in the procedure from the performing physician.  

## 2018-10-07 NOTE — Patient Instructions (Signed)
YOU HAD AN ENDOSCOPIC PROCEDURE TODAY AT THE [facility]:   Refer to the procedure report that was given to you for any specific questions about what was found during the examination.  If the procedure report does not answer your questions, please call your gastroenterologist to clarify.  If you requested that your care partner not be given the details of your procedure findings, then the procedure report has been included in a sealed envelope for you to review at your convenience later.  YOU SHOULD EXPECT: Some feelings of bloating in the abdomen. Passage of more gas than usual.  Walking can help get rid of the air that was put into your GI tract during the procedure and reduce the bloating. If you had a lower endoscopy (such as a colonoscopy or flexible sigmoidoscopy) you may notice spotting of blood in your stool or on the toilet paper. If you underwent a bowel prep for your procedure, you may not have a normal bowel movement for a few days.  Please Note:  You might notice some irritation and congestion in your nose or some drainage.  This is from the oxygen used during your procedure.  There is no need for concern and it should clear up in a day or so.  SYMPTOMS TO REPORT IMMEDIATELY:   Following lower endoscopy (colonoscopy or flexible sigmoidoscopy):  Excessive amounts of blood in the stool  Significant tenderness or worsening of abdominal pains  Swelling of the abdomen that is new, acute  Fever of 100F or higher  For urgent or emergent issues, a gastroenterologist can be reached at any hour by calling [phone].   DIET:  We do recommend a small meal at first, but then you may proceed to your regular diet.  Drink plenty of fluids but you should avoid alcoholic beverages for 24 hours.  ACTIVITY:  You should plan to take it easy for the rest of today and you should NOT DRIVE or use heavy machinery until tomorrow (because of the sedation medicines used during the test).     FOLLOW UP: Our staff will call the number listed on your records the next business day following your procedure to check on you and address any questions or concerns that you may have regarding the information given to you following your procedure. If we do not reach you, we will leave a message.  However, if you are feeling well and you are not experiencing any problems, there is no need to return our call.  We will assume that you have returned to your regular daily activities without incident.  If any biopsies were taken you will be contacted by phone or by letter within the next 1-3 weeks.  Please call us at [phone] if you have not heard about the biopsies in 3 weeks.    SIGNATURES/CONFIDENTIALITY: You and/or your care partner have signed paperwork which will be entered into your electronic medical record.  These signatures attest to the fact that that the information above on your After Visit Summary has been reviewed and is understood.  Full responsibility of the confidentiality of this discharge information lies with you and/or your care-partner. 

## 2018-10-07 NOTE — Progress Notes (Signed)
Pt's states no medical or surgical changes since previsit or office visit. 

## 2018-10-07 NOTE — Telephone Encounter (Signed)
I will continue to sign

## 2018-10-07 NOTE — Progress Notes (Signed)
Alert and oriented x 3, pleased with MAC, report to RN

## 2018-10-07 NOTE — Op Note (Signed)
[place] Patient Name: Laurie Krueger Procedure Date: 10/07/2018 2:40 PM MRN: [id number] Endoscopist: Jerene Bears , MD Age: 57 Referring MD:  Date of Birth: [birth year] Gender: Female Account #: [id number] Procedure:                Colonoscopy Indications:              High risk colon cancer surveillance: Personal                            history of non-advanced adenoma, Last colonoscopy 5                            years ago Medicines:                Monitored Anesthesia Care Procedure:                Pre-Anesthesia Assessment:                           - Prior to the procedure, a History and Physical                            was performed, and patient medications and                            allergies were reviewed. The patient's tolerance of                            previous anesthesia was also reviewed. The risks                            and benefits of the procedure and the sedation                            options and risks were discussed with the patient.                            All questions were answered, and informed consent                            was obtained. Prior Anticoagulants: The patient has                            taken no previous anticoagulant or antiplatelet                            agents. ASA Grade Assessment: II - A patient with                            mild systemic disease. After reviewing the risks                            and benefits, the patient was deemed in  satisfactory condition to undergo the procedure.                           After obtaining informed consent, the colonoscope                            was passed under direct vision. Throughout the                            procedure, the patient's blood pressure, pulse, and                            oxygen saturations were monitored continuously. The                            Colonoscope was introduced through the anus and                        advanced to the cecum, identified by appendiceal                            orifice and ileocecal valve. The colonoscopy was                            performed without difficulty. The patient tolerated                            the procedure well. The quality of the bowel                            preparation was good. The ileocecal valve,                            appendiceal orifice, and rectum were photographed. Scope In: 6:62:94 PM Scope Out: 3:03:41 PM Scope Withdrawal Time: 0 hours 11 minutes 56 seconds  Total Procedure Duration: 0 hours 14 minutes 30 seconds  Findings:                 The digital rectal exam was normal.                           A 3 mm polyp was found in the ascending colon. The                            polyp was sessile. The polyp was removed with a                            cold snare. Resection and retrieval were complete.                           A single small-mouthed diverticulum was found in                            the cecum.  Internal hemorrhoids were found during                            retroflexion. The hemorrhoids were small.                           The exam was otherwise without abnormality. Complications:            No immediate complications. Estimated Blood Loss:     Estimated blood loss was minimal. Impression:               - One 3 mm polyp in the ascending colon, removed                            with a cold snare. Resected and retrieved.                           - Diverticulosis in the cecum.                           - Very small internal hemorrhoids.                           - The examination was otherwise normal. Recommendation:           - Patient has a contact number available for                            emergencies. The signs and symptoms of potential                            delayed complications were discussed with the                            patient. Return to  normal activities tomorrow.                            Written discharge instructions were provided to the                            patient.                           - Resume previous diet.                           - Continue present medications.                           - Await pathology results.                           - Repeat colonoscopy is recommended for                            surveillance. The colonoscopy date will be  determined after pathology results from today's                            exam become available for review. Jerene Bears, MD 10/07/2018 3:07:05 PM This report has been signed electronically.

## 2018-10-07 NOTE — Telephone Encounter (Signed)
-----   Message from Brunetta Jeans, PA-C sent at 10/05/2018 10:38 AM EST ----- Regarding: Adderall RX See if Dr. Birdie Riddle will continue 90-day.

## 2018-10-07 NOTE — Addendum Note (Signed)
Addended by: Brunetta Jeans on: 10/07/2018 01:32 PM   Modules accepted: Orders

## 2018-10-08 ENCOUNTER — Telehealth: Payer: Self-pay | Admitting: *Deleted

## 2018-10-08 ENCOUNTER — Telehealth: Payer: Self-pay

## 2018-10-08 NOTE — Telephone Encounter (Signed)
No answer for post procedure call back. Left message for patient to call with questions or concerns. Sm 

## 2018-10-08 NOTE — Telephone Encounter (Signed)
NO ANSWER, MESSAGE LEFT FOR PATIENT. 

## 2018-10-14 ENCOUNTER — Encounter: Payer: Self-pay | Admitting: Internal Medicine

## 2019-02-20 ENCOUNTER — Ambulatory Visit (INDEPENDENT_AMBULATORY_CARE_PROVIDER_SITE_OTHER): Payer: 59 | Admitting: Physician Assistant

## 2019-02-20 ENCOUNTER — Encounter: Payer: Self-pay | Admitting: Physician Assistant

## 2019-02-20 ENCOUNTER — Other Ambulatory Visit: Payer: Self-pay

## 2019-02-20 VITALS — BP 108/56 | HR 78 | Temp 97.8°F | Ht 67.0 in | Wt 194.0 lb

## 2019-02-20 DIAGNOSIS — R42 Dizziness and giddiness: Secondary | ICD-10-CM

## 2019-02-20 DIAGNOSIS — E119 Type 2 diabetes mellitus without complications: Secondary | ICD-10-CM | POA: Diagnosis not present

## 2019-02-20 DIAGNOSIS — R300 Dysuria: Secondary | ICD-10-CM

## 2019-02-20 MED ORDER — TRAZODONE HCL 50 MG PO TABS: 25.0000 mg | ORAL_TABLET | Freq: Every evening | ORAL | 3 refills | Status: DC | PRN

## 2019-02-20 MED ORDER — ONDANSETRON 8 MG PO TBDP: 8.0000 mg | ORAL_TABLET | Freq: Three times a day (TID) | ORAL | 0 refills | Status: DC | PRN

## 2019-02-20 NOTE — Progress Notes (Signed)
Virtual Visit via Video   I connected with patient on 02/20/19 at  9:00 AM EDT by a video enabled telemedicine application and verified that I am speaking with the correct person using two identifiers.  Location patient: Home Location provider: [facility], Office Persons participating in the virtual visit: Patient, Provider, [place] (Patina Moore)  I discussed the limitations of evaluation and management by telemedicine and the availability of in person appointments. The patient expressed understanding and agreed to proceed.  Subjective:   HPI:   Patient presents today via [facility] for follow-up of Diabetes Mellitus II previously controlled. Is currently on a regimen of Ozempic 0.5 mg once weekly and Metformin 500 mg QAM and 1000 mg QPM. Endorses taking medications as directed. Does check glucose levels every other day and notes fasting glucose levels averaging 100-110. Notes weight loss with medication. Does note some nausea with the increased dose of Ozempic.Is keeping up with activity level, playing tennis a few times per week. Watching diet.  Patient is due for eye examination and updated urine microalbumin, A1C and BMP levels.  Patient notes an episode of lightheadedness with syncope occurring the other morning. Notes she woke up and hopped out of bed quickly, immediately feeling lightheaded. Denies dizziness. Notes she blacked out for 1 minute (states she knows this because she had texted her daughter prior to getting up and this was time stamped). Felt fine afterwards. Denies any racing heart, shortness of breath. Notes she has not hydrated as well as she should and knows she likely became orthostatic from getting up so quickly.   ROS:   See pertinent positives and negatives per HPI.  Patient Active Problem List   Diagnosis Date Noted  . Dysuria 10/03/2018  . UTI symptoms 02/19/2018  . Colon cancer screening 09/19/2017  . Cervical neck pain with evidence of disc disease  08/24/2015  . ADD (attention deficit disorder) 06/29/2015  . Diabetes mellitus type II, controlled ([place]) 01/12/2015  . Hyperlipidemia associated with type 2 diabetes mellitus ([place]) 10/07/2014  . Visit for preventive health examination 10/14/2013  . Hypothyroidism 10/14/2013  . GERD (gastroesophageal reflux disease) 10/14/2013    Social History   Tobacco Use  . Smoking status: Never Smoker  . Smokeless tobacco: Never Used  Substance Use Topics  . Alcohol use: No    Comment: rare occ.    Current Outpatient Medications:  .  amitriptyline (ELAVIL) 10 MG tablet, TAKE 1 TO 3 TABLETS AT  BEDTIME AS NEEDED, Disp: 270 tablet, Rfl: 1 .  amphetamine-dextroamphetamine (ADDERALL) 20 MG tablet, Take 1 tablet (20 mg total) by mouth 2 (two) times daily., Disp: 180 tablet, Rfl: 0 .  aspirin 81 MG tablet, Take 325 mg by mouth daily. , Disp: , Rfl:  .  celecoxib (CELEBREX) 100 MG capsule, TAKE 1 CAPSULE BY MOUTH TWO TIMES DAILY, Disp: 180 capsule, Rfl: 1 .  citalopram (CELEXA) 10 MG tablet, Take 1 tablet (10 mg total) by mouth daily., Disp: 30 tablet, Rfl: 1 .  cyanocobalamin (,VITAMIN B-12,) 1000 MCG/ML injection, INJECT 1000MCG INTO THE SKIN MONTHLY., Disp: 30 mL, Rfl: 2 .  ezetimibe (ZETIA) 10 MG tablet, Take 1 tablet (10 mg total) by mouth daily., Disp: 90 tablet, Rfl: 1 .  metFORMIN (GLUCOPHAGE) 500 MG tablet, TAKE 1 TABLET EVERY MORNING AND 2 TABLETS EVERY EVENING, Disp: 270 tablet, Rfl: 1 .  mupirocin cream (BACTROBAN) 2 %, Apply 1 application topically 2 (two) times daily. For 5 days., Disp: 15 g, Rfl: 0 .  ondansetron (ZOFRAN) 4 MG tablet, TAKE 1 TABLET BY MOUTH  EVERY 8 HOURS AS NEEDED FOR NAUSEA AND VOMITING, Disp: 90 tablet, Rfl: 0 .  pantoprazole (PROTONIX) 40 MG tablet, Take 1 tablet (40 mg total) by mouth daily., Disp: 90 tablet, Rfl: 1 .  Semaglutide,0.25 or 0.5MG /DOS, (OZEMPIC, 0.25 OR 0.5 MG/DOSE,) 2 MG/1.5ML SOPN, Inject 0.25 mg into the skin once a week. X 4 week. Then increase to 0.5  mg once weekly, Disp: 3 pen, Rfl: 1 .  SYNTHROID 175 MCG tablet, TAKE 1 TABLET BY MOUTH  DAILY BEFORE BREAKFAST, Disp: 90 tablet, Rfl: 1  Allergies  Allergen Reactions  . Codeine Nausea And Vomiting  . Morphine And Related Nausea And Vomiting    Objective:   BP (!) 108/56   Pulse 78   Temp 97.8 F (36.6 C) (Oral)   Ht 5\' 7"  (1.702 m)   Wt 194 lb (88 kg)   SpO2 98%   BMI 30.38 kg/m   Patient is well-developed, well-nourished in no acute distress.  Resting comfortably at home.  Head is normocephalic, atraumatic.  No labored breathing.  Speech is clear and coherent with logical contest.  Patient is alert and oriented at baseline.  No cranial nerve deficit on examination via video.  Assessment and Plan:   1. Controlled type 2 diabetes mellitus without complication, without long-term current use of insulin ([place]) Up-to-date on immunizations, eye exam and foot examination. Curretn fasting glucose levels at goal. Discussed giving the nausea with current dose of Ozempic, we will see where current levels are at and determine if we can attempt a trial of lower dose Ozempic or switch to another GLP-1 agent. Zofran sent in to have on hand for now. She is currently staying in [place] as they have a house there. Lab orders sent to her local [facility] so she can have drawn and faxed back to office. Labs as noted below. - CBC w/Diff; Future - Basic metabolic panel; Future - Hemoglobin A1c; Future - B12; Future - Urine Microalbumin w/creat. ratio; Future  2. 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 Increase fluids. Will switch her PRN Amitriptyline to Trazodone for sleep as the Amitriptyline seems to be a likely contributor to morning orthostatics. Labs as above to include CBC and BMP.  3. Dysuria Chronic and intermittent. Question undiagnosed IC but she has been seen for UTI at [facility] recently. Would like repeat urine testing to ensure resolution. Is considering  letting me set her up with Urology.  - Urinalysis, Routine w reflex microscopic; Future - Urine Culture; Future    [name], PA-C 02/20/2019

## 2019-02-20 NOTE — Progress Notes (Signed)
I have discussed the procedure for the virtual visit with the patient who has given consent to proceed with assessment and treatment.   Laurie Krueger, CMA     

## 2019-02-21 MED ORDER — PANTOPRAZOLE SODIUM 40 MG PO TBEC: 40.0000 mg | DELAYED_RELEASE_TABLET | Freq: Every day | ORAL | 1 refills | Status: DC

## 2019-02-21 MED ORDER — METFORMIN HCL 500 MG PO TABS: ORAL_TABLET | ORAL | 1 refills | Status: DC

## 2019-02-21 MED ORDER — CITALOPRAM HYDROBROMIDE 10 MG PO TABS: 10.0000 mg | ORAL_TABLET | Freq: Every day | ORAL | 1 refills | Status: DC

## 2019-02-21 MED ORDER — EZETIMIBE 10 MG PO TABS: 10.0000 mg | ORAL_TABLET | Freq: Every day | ORAL | 1 refills | Status: DC

## 2019-02-21 MED ORDER — SYNTHROID 175 MCG PO TABS: ORAL_TABLET | ORAL | 1 refills | Status: AC

## 2019-02-21 MED ORDER — CELECOXIB 100 MG PO CAPS: ORAL_CAPSULE | ORAL | 1 refills | Status: DC

## 2019-02-27 ENCOUNTER — Encounter: Payer: Self-pay | Admitting: Physician Assistant

## 2019-02-27 DIAGNOSIS — F909 Attention-deficit hyperactivity disorder, unspecified type: Secondary | ICD-10-CM

## 2019-02-27 DIAGNOSIS — E119 Type 2 diabetes mellitus without complications: Secondary | ICD-10-CM

## 2019-02-28 MED ORDER — SEMAGLUTIDE(0.25 OR 0.5MG/DOS) 2 MG/1.5ML ~~LOC~~ SOPN: 0.2500 mg | PEN_INJECTOR | SUBCUTANEOUS | 1 refills | Status: DC

## 2019-02-28 MED ORDER — ONDANSETRON 8 MG PO TBDP: 8.0000 mg | ORAL_TABLET | Freq: Three times a day (TID) | ORAL | 0 refills | Status: DC | PRN

## 2019-02-28 MED ORDER — AMPHETAMINE-DEXTROAMPHETAMINE 20 MG PO TABS: 20.0000 mg | ORAL_TABLET | Freq: Two times a day (BID) | ORAL | 0 refills | Status: DC

## 2019-02-28 MED ORDER — TRAZODONE HCL 50 MG PO TABS: 25.0000 mg | ORAL_TABLET | Freq: Every evening | ORAL | 3 refills | Status: DC | PRN

## 2019-03-05 ENCOUNTER — Other Ambulatory Visit: Payer: Self-pay | Admitting: Physician Assistant

## 2019-03-05 LAB — BASIC METABOLIC PANEL
BUN: 15 (ref 4–21)
Creatinine: 0.9 (ref 0.5–1.1)
Glucose: 101
Potassium: 4.2 (ref 3.4–5.3)
Sodium: 141 (ref 137–147)

## 2019-03-05 LAB — CBC AND DIFFERENTIAL
HCT: 32 — AB (ref 36–46)
Hemoglobin: 10.9 — AB (ref 12.0–16.0)
Platelets: 265 (ref 150–399)
WBC: 5.4

## 2019-03-05 LAB — VITAMIN B12: Vitamin B-12: 453

## 2019-03-05 LAB — MICROALBUMIN, URINE: Microalb, Ur: 5.4

## 2019-03-05 LAB — HEMOGLOBIN A1C: Hemoglobin A1C: 6.3

## 2019-03-06 ENCOUNTER — Encounter: Payer: Self-pay | Admitting: Physician Assistant

## 2019-03-07 ENCOUNTER — Encounter: Payer: Self-pay | Admitting: Physician Assistant

## 2019-03-07 LAB — BASIC METABOLIC PANEL
BUN/Creatinine Ratio: 16 (ref 9–23)
BUN: 15 mg/dL (ref 6–24)
CO2: 24 mmol/L (ref 20–29)
Calcium: 9.9 mg/dL (ref 8.7–10.2)
Chloride: 104 mmol/L (ref 96–106)
Creatinine, Ser: 0.92 mg/dL (ref 0.57–1.00)
GFR calc Af Amer: 79 mL/min/{1.73_m2} (ref >59)
GFR calc non Af Amer: 68 mL/min/{1.73_m2} (ref >59)
Glucose: 101 mg/dL — ABNORMAL HIGH (ref 65–99)
Potassium: 4.2 mmol/L (ref 3.5–5.2)
Sodium: 141 mmol/L (ref 134–144)

## 2019-03-07 LAB — CBC WITH DIFFERENTIAL/PLATELET
Basophils Absolute: 0.1 10*3/uL (ref 0.0–0.2)
Basos: 1 %
EOS (ABSOLUTE): 0.1 10*3/uL (ref 0.0–0.4)
Eos: 3 %
Hematocrit: 31.9 % — ABNORMAL LOW (ref 34.0–46.6)
Hemoglobin: 10.9 g/dL — ABNORMAL LOW (ref 11.1–15.9)
Immature Grans (Abs): 0 10*3/uL (ref 0.0–0.1)
Immature Granulocytes: 0 %
Lymphocytes Absolute: 1.9 10*3/uL (ref 0.7–3.1)
Lymphs: 35 %
MCH: 29.3 pg (ref 26.6–33.0)
MCHC: 34.2 g/dL (ref 31.5–35.7)
MCV: 86 fL (ref 79–97)
Monocytes Absolute: 0.5 10*3/uL (ref 0.1–0.9)
Monocytes: 9 %
Neutrophils Absolute: 2.8 10*3/uL (ref 1.4–7.0)
Neutrophils: 52 %
Platelets: 265 10*3/uL (ref 150–450)
RBC: 3.72 x10E6/uL — ABNORMAL LOW (ref 3.77–5.28)
RDW: 11.9 % (ref 11.7–15.4)
WBC: 5.4 10*3/uL (ref 3.4–10.8)

## 2019-03-07 LAB — URINALYSIS, ROUTINE W REFLEX MICROSCOPIC
Bilirubin, UA: NEGATIVE
Ketones, UA: NEGATIVE
Leukocytes,UA: NEGATIVE
Nitrite, UA: NEGATIVE
Protein,UA: NEGATIVE
RBC, UA: NEGATIVE
Specific Gravity, UA: 1.028 (ref 1.005–1.030)
Urobilinogen, Ur: 0.2 mg/dL (ref 0.2–1.0)
pH, UA: 5 (ref 5.0–7.5)

## 2019-03-07 LAB — VITAMIN B12: Vitamin B-12: 453 pg/mL (ref 232–1245)

## 2019-03-07 LAB — HEMOGLOBIN A1C
Est. average glucose Bld gHb Est-mCnc: 134 mg/dL
Hgb A1c MFr Bld: 6.3 % — ABNORMAL HIGH (ref 4.8–5.6)

## 2019-03-07 LAB — URINE CULTURE

## 2019-03-07 LAB — MICROALBUMIN / CREATININE URINE RATIO
Creatinine, Urine: 193.3 mg/dL
Microalb/Creat Ratio: 3 mg/g creat (ref 0–29)
Microalbumin, Urine: 5.4 ug/mL

## 2019-03-07 NOTE — Telephone Encounter (Signed)
Pt calling for lab results, she states that she has talked with [facility] and knows the labs have been sent to [place].

## 2019-03-13 ENCOUNTER — Encounter: Payer: Self-pay | Admitting: Physician Assistant

## 2019-03-24 ENCOUNTER — Other Ambulatory Visit: Payer: Self-pay | Admitting: Physician Assistant

## 2019-03-24 ENCOUNTER — Encounter: Payer: Self-pay | Admitting: Physician Assistant

## 2019-03-24 DIAGNOSIS — F909 Attention-deficit hyperactivity disorder, unspecified type: Secondary | ICD-10-CM

## 2019-03-24 DIAGNOSIS — R634 Abnormal weight loss: Secondary | ICD-10-CM

## 2019-03-25 MED ORDER — ONDANSETRON 8 MG PO TBDP: 8.0000 mg | ORAL_TABLET | Freq: Three times a day (TID) | ORAL | 0 refills | Status: DC | PRN

## 2019-03-25 MED ORDER — TRAZODONE HCL 50 MG PO TABS: 50.0000 mg | ORAL_TABLET | Freq: Every evening | ORAL | 3 refills | Status: DC | PRN

## 2019-03-25 MED ORDER — AMPHETAMINE-DEXTROAMPHETAMINE 20 MG PO TABS: 20.0000 mg | ORAL_TABLET | Freq: Two times a day (BID) | ORAL | 0 refills | Status: DC

## 2019-03-25 NOTE — Telephone Encounter (Signed)
Adderall last rx 02/28/19 #60 LOV: 02/20/19 CSC: 10/03/2016

## 2019-04-02 ENCOUNTER — Telehealth: Payer: Self-pay | Admitting: Internal Medicine

## 2019-04-02 NOTE — Addendum Note (Signed)
Addended by: Brunetta Jeans on: 04/02/2019 08:12 AM   Modules accepted: Orders

## 2019-04-02 NOTE — Telephone Encounter (Signed)
Patient called ni wanting to schedule an emergency appointment with the provider due to Unexplained weight loss 40lb ,no appetite,eat feels nausea.

## 2019-04-02 NOTE — Telephone Encounter (Signed)
Pt states she has lost 40lbs in the last 3 months and has not been trying at all. States her PCP wanted her to see GI. Pt scheduled to see Amy Esterwood PA 04/08/19 at 9am.  Pt aware of appt.

## 2019-04-07 ENCOUNTER — Telehealth: Payer: Self-pay

## 2019-04-07 NOTE — Telephone Encounter (Signed)
Covid-19 screening questions   Do you now or have you had a fever in the last 14 days? NO   Do you have any respiratory symptoms of shortness of breath or cough now or in the last 14 days? NO  Do you have any family members or close contacts with diagnosed or suspected Covid-19 in the past 14 days? NO  Have you been tested for Covid-19 and found to be positive? Yes, test was negative.

## 2019-04-08 ENCOUNTER — Encounter: Payer: Self-pay | Admitting: Physician Assistant

## 2019-04-08 ENCOUNTER — Ambulatory Visit (INDEPENDENT_AMBULATORY_CARE_PROVIDER_SITE_OTHER): Payer: 59 | Admitting: Physician Assistant

## 2019-04-08 ENCOUNTER — Ambulatory Visit (HOSPITAL_COMMUNITY): Payer: 59

## 2019-04-08 VITALS — BP 118/62 | HR 70 | Temp 97.6°F | Ht 68.0 in | Wt 174.6 lb

## 2019-04-08 DIAGNOSIS — R112 Nausea with vomiting, unspecified: Secondary | ICD-10-CM

## 2019-04-08 DIAGNOSIS — R630 Anorexia: Secondary | ICD-10-CM

## 2019-04-08 DIAGNOSIS — R634 Abnormal weight loss: Secondary | ICD-10-CM

## 2019-04-08 MED ORDER — PANTOPRAZOLE SODIUM 40 MG PO TBEC: 40.0000 mg | DELAYED_RELEASE_TABLET | ORAL | 1 refills | Status: DC

## 2019-04-08 MED ORDER — ONDANSETRON 8 MG PO TBDP: 8.0000 mg | ORAL_TABLET | Freq: Three times a day (TID) | ORAL | 0 refills | Status: DC | PRN

## 2019-04-08 NOTE — Patient Instructions (Addendum)
If you are age 59 or older, your body mass index should be between 23-30. Your Body mass index is 26.55 kg/m. If this is out of the aforementioned range listed, please consider follow up with your Primary Care Provider.  If you are age 25 or younger, your body mass index should be between 19-25. Your Body mass index is 26.55 kg/m. If this is out of the aformentioned range listed, please consider follow up with your Primary Care Provider.    Your provider has requested that you go to the basement level for lab work before leaving today. Press "B" on the elevator. The lab is located at the first door on the left as you exit the elevator.   We have sent the following medications to your pharmacy for you to pick up at your convenience: Protonix Continue Zofran  You have been given orders for Abdominal Ultrasound and CT abdomen and pelvis with contrast.  PER insurance Ultrasound must be done first.  We will call you with results.  Thank you for choosing me and [facility].   Amy Esterwood, PA-C

## 2019-04-08 NOTE — Progress Notes (Addendum)
Subjective:    Patient ID: Laurie Krueger, female    DOB: [birth year], 58 y.o.   MRN: [id number]  HPI Laurie Krueger is a pleasant 58 year old white female, known to Dr. Hilarie Fredrickson colonoscopy done in December 2019.  She comes in today with new concern of 40 pound weight loss over the past 4 months. Patient has history of GERD, hypothyroidism, adult onset diabetes mellitus and ADD.  She underwent a sleeve gastrectomy in 2013 for obesity.  She says she initially lost weight over the first year and her weight had been stable ever since at around 2022 211 pounds. Towards the end of March this year she started noticing unintentional weight loss, and weight herself at 188 pounds.  By April she was down further now says that she seems to be losing 3 to 4 pounds per week she has not had any changes in her activity level or diet.  She does not feel like she has been in her any significant stress. She did start Ozempic in January 2020 which can be associated with weight loss.  She stopped that for 2 weeks did not notice any change and had some continued weight loss and therefore restarted it.  She has since stopped Zetia and metformin. She relates having a decrease in appetite and over the past few weeks has been noticing that she is eating less and after a few bites of each meal she just does not want anymore.  She has not had any abdominal pain.  She has had some more persistent nausea over the past few weeks without vomiting no clear early satiety, has noted some increase in GERD symptoms.  She has not noted any changes in her bowel habits, no melena or hematochezia. Patient weighed 174 pounds today.  She says she does not mind weight loss but wants to be sure that there is nothing worrisome causing it.  Review of Systems Pertinent positive and negative review of systems were noted in the above HPI section.  All other review of systems was otherwise negative.  Outpatient Encounter Medications as of 04/08/2019  Medication  Sig  . amphetamine-dextroamphetamine (ADDERALL) 20 MG tablet Take 1 tablet (20 mg total) by mouth 2 (two) times daily. (Patient taking differently: Take 20 mg by mouth daily. )  . aspirin 81 MG tablet Take 325 mg by mouth daily.   . celecoxib (CELEBREX) 100 MG capsule TAKE 1 CAPSULE BY MOUTH TWO TIMES DAILY  . cyanocobalamin (,VITAMIN B-12,) 1000 MCG/ML injection INJECT 1000MCG INTO THE SKIN MONTHLY.  . mupirocin cream (BACTROBAN) 2 % Apply 1 application topically 2 (two) times daily. For 5 days.  . ondansetron (ZOFRAN-ODT) 8 MG disintegrating tablet Take 1 tablet (8 mg total) by mouth every 8 (eight) hours as needed for nausea or vomiting.  . pantoprazole (PROTONIX) 40 MG tablet Take 1 tablet (40 mg total) by mouth every morning. Take 30 minutes before breakfast  . Semaglutide,0.25 or 0.'5MG'$ /DOS, (OZEMPIC, 0.25 OR 0.5 MG/DOSE,) 2 MG/1.5ML SOPN Inject 0.25 mg into the skin once a week. X 4 week. Then increase to 0.5 mg once weekly  . SYNTHROID 175 MCG tablet TAKE 1 TABLET BY MOUTH  DAILY BEFORE BREAKFAST  . traZODone (DESYREL) 50 MG tablet Take 1 tablet (50 mg total) by mouth at bedtime as needed for sleep.  . [DISCONTINUED] ondansetron (ZOFRAN-ODT) 8 MG disintegrating tablet Take 1 tablet (8 mg total) by mouth every 8 (eight) hours as needed for nausea or vomiting.  . [DISCONTINUED] pantoprazole (PROTONIX) 40  MG tablet Take 1 tablet (40 mg total) by mouth daily.  Marland Kitchen ezetimibe (ZETIA) 10 MG tablet Take 1 tablet (10 mg total) by mouth daily. (Patient not taking: Reported on 04/08/2019)  . metFORMIN (GLUCOPHAGE) 500 MG tablet TAKE 1 TABLET EVERY MORNING AND 2 TABLETS EVERY EVENING (Patient not taking: Reported on 04/08/2019)  . [DISCONTINUED] citalopram (CELEXA) 10 MG tablet Take 1 tablet (10 mg total) by mouth daily.   No facility-administered encounter medications on file as of 04/08/2019.    Allergies  Allergen Reactions  . Codeine Nausea And Vomiting  . Morphine And Related Nausea And Vomiting    Patient Active Problem List   Diagnosis Date Noted  . Dysuria 10/03/2018  . UTI symptoms 02/19/2018  . Colon cancer screening 09/19/2017  . Cervical neck pain with evidence of disc disease 08/24/2015  . ADD (attention deficit disorder) 06/29/2015  . Diabetes mellitus type II, controlled ([place]) 01/12/2015  . Hyperlipidemia associated with type 2 diabetes mellitus ([place]) 10/07/2014  . Visit for preventive health examination 10/14/2013  . Hypothyroidism 10/14/2013  . GERD (gastroesophageal reflux disease) 10/14/2013   Social History   Socioeconomic History  . Marital status: Married    Spouse name: Not on file  . Number of children: Not on file  . Years of education: Not on file  . Highest education level: Not on file  Occupational History  . Not on file  Social Needs  . Financial resource strain: Not on file  . Food insecurity    Worry: Not on file    Inability: Not on file  . Transportation needs    Medical: Not on file    Non-medical: Not on file  Tobacco Use  . Smoking status: Never Smoker  . Smokeless tobacco: Never Used  Substance and Sexual Activity  . Alcohol use: No    Comment: rare occ.  . Drug use: No  . Sexual activity: Yes  Lifestyle  . Physical activity    Days per week: Not on file    Minutes per session: Not on file  . Stress: Not on file  Relationships  . Social Herbalist on phone: Not on file    Gets together: Not on file    Attends religious service: Not on file    Active member of club or organization: Not on file    Attends meetings of clubs or organizations: Not on file    Relationship status: Not on file  . Intimate partner violence    Fear of current or ex partner: Not on file    Emotionally abused: Not on file    Physically abused: Not on file    Forced sexual activity: Not on file  Other Topics Concern  . Not on file  Social History Narrative  . Not on file    Ms. Grandison's family history includes Alzheimer's disease in  her maternal aunt and maternal grandmother; Breast cancer in her maternal aunt; Cancer in an other family member; Colitis in her mother; Berenice Primas' disease in her son; Healthy in her daughter and sister; Heart attack in her maternal grandfather and paternal grandfather; Heart disease in her father and mother; Hypothyroidism in her mother; Lung cancer in her father and mother; Stroke in her paternal aunt; Ulcerative colitis in an other family member.      Objective:    Vitals:   04/08/19 0855  BP: 118/62  Pulse: 70  Temp: 97.6 F (36.4 C)    Physical Exam; well-developed  older white female in no acute distress, very pleasant, height 5 foot 8, weight 174, BMI 26.5.  HEENT; nontraumatic normocephalic EOMI PERRLA sclera anicteric, oropharynx not examined, patient wearing mask/COVID crisis, neck supple.  Cardiovascular; regular rate and rhythm with S1-S2 no murmur rub or gallop, Pulmonary ;clear bilaterally.  Abdomen; soft, nondistended, nontender there is no palpable mass or hepatosplenomegaly, no audible succussion splash, bowel sounds present.  Rectal ;exam not done today, Extremities; no clubbing cyanosis or edema skin warm and dry, Neuropsych ;alert and oriented, grossly nonfocal mood and affect appropriate       Assessment & Plan:   #61 58 year old white female status post sleeve gastrectomy 2013 for obesity presents with unintentional 40 pound weight loss over the past 4 months. Over the past weeks has noticed nausea which has become fairly persistent, and decrease in appetite.  Patient was started on Ozempic January 2020 she does carry associated side effect of weight loss.  She stopped this medicine for couple of weeks recently and noticed no changes and had persistent weight loss so restarted.  Etiology of weight loss is not clear, consider medication related . Rule out peptic ulcer disease, occult malignancy, rule out hypothyroid  #2 history of hypothyroidism #3.  Adult onset diabetes  mellitus #4.  GERD #5.  History of tubular adenomatous colon polyp-up-to-date with colonoscopy just done December 2019 and due for interval follow-up in 5 years # 6 mild normocytic anemia last hemoglobin 10.9   Plan; Patient had some baseline labs done on 03/05/2019 included CBC, be met hemoglobin A1c.  Will obtain TSH, hepatic panel and sed rate today.  Patient needs to be scheduled for CT scan of the abdomen and pelvis with contrast.  After attempting to schedule the CT it has been determined that her insurance requires her to have an upper abdominal ultrasound prior to authorizing a CT scan and therefore we will initiate work-up with ultrasound and anticipate following this with a CT scan. Continue Protonix 40 mg p.o. every morning Patient has prescriptions for Zofran ODT 8 mg every 8 hours to use PRN If labs and ultrasound/CT are unrevealing she will need to be scheduled for EGD with Dr. Hilarie Fredrickson.  Laurie Krueger Genia Harold PA-C 04/08/2019   Cc: Brunetta Jeans, PA-C   Addendum: Reviewed and agree with assessment and management plan. Pyrtle, Lajuan Lines, MD

## 2019-05-16 ENCOUNTER — Other Ambulatory Visit: Payer: Self-pay

## 2019-05-16 ENCOUNTER — Encounter: Payer: Self-pay | Admitting: Physician Assistant

## 2019-05-16 ENCOUNTER — Other Ambulatory Visit: Payer: Self-pay | Admitting: Physician Assistant

## 2019-05-16 DIAGNOSIS — F909 Attention-deficit hyperactivity disorder, unspecified type: Secondary | ICD-10-CM

## 2019-05-16 MED ORDER — ONDANSETRON 8 MG PO TBDP: 8.0000 mg | ORAL_TABLET | Freq: Three times a day (TID) | ORAL | 0 refills | Status: DC | PRN

## 2019-05-16 NOTE — Telephone Encounter (Signed)
Last OV 02/20/19 adderall 20mg  last filled 03/25/19 #60 with 0 bactroban last filled 03/29/18 15mg  with 0

## 2019-05-19 ENCOUNTER — Other Ambulatory Visit: Payer: Self-pay | Admitting: Emergency Medicine

## 2019-05-19 DIAGNOSIS — F909 Attention-deficit hyperactivity disorder, unspecified type: Secondary | ICD-10-CM

## 2019-05-19 MED ORDER — AMPHETAMINE-DEXTROAMPHETAMINE 20 MG PO TABS: 20.0000 mg | ORAL_TABLET | Freq: Two times a day (BID) | ORAL | 0 refills | Status: DC

## 2019-05-19 MED ORDER — ONDANSETRON 8 MG PO TBDP: 8.0000 mg | ORAL_TABLET | Freq: Three times a day (TID) | ORAL | 0 refills | Status: DC | PRN

## 2019-05-19 NOTE — Telephone Encounter (Signed)
Adderall last rx 03/25/19 #60 LOV: 02/20/19

## 2019-07-26 ENCOUNTER — Other Ambulatory Visit: Payer: Self-pay | Admitting: Physician Assistant

## 2019-08-23 DIAGNOSIS — Z9049 Acquired absence of other specified parts of digestive tract: Secondary | ICD-10-CM | POA: Insufficient documentation

## 2019-08-27 ENCOUNTER — Other Ambulatory Visit: Payer: Self-pay | Admitting: Physician Assistant

## 2019-08-27 DIAGNOSIS — E119 Type 2 diabetes mellitus without complications: Secondary | ICD-10-CM

## 2019-10-01 ENCOUNTER — Encounter: Payer: Self-pay | Admitting: Physician Assistant

## 2019-10-01 ENCOUNTER — Other Ambulatory Visit: Payer: Self-pay

## 2019-10-01 ENCOUNTER — Ambulatory Visit (INDEPENDENT_AMBULATORY_CARE_PROVIDER_SITE_OTHER): Payer: 59 | Admitting: Physician Assistant

## 2019-10-01 VITALS — BP 110/72 | HR 70 | Temp 97.8°F | Resp 16 | Ht 68.0 in | Wt 163.0 lb

## 2019-10-01 DIAGNOSIS — F909 Attention-deficit hyperactivity disorder, unspecified type: Secondary | ICD-10-CM

## 2019-10-01 DIAGNOSIS — K819 Cholecystitis, unspecified: Secondary | ICD-10-CM | POA: Diagnosis not present

## 2019-10-01 DIAGNOSIS — R634 Abnormal weight loss: Secondary | ICD-10-CM

## 2019-10-01 DIAGNOSIS — R002 Palpitations: Secondary | ICD-10-CM

## 2019-10-01 DIAGNOSIS — K563 Gallstone ileus: Secondary | ICD-10-CM

## 2019-10-01 DIAGNOSIS — Z9049 Acquired absence of other specified parts of digestive tract: Secondary | ICD-10-CM

## 2019-10-01 LAB — COMPREHENSIVE METABOLIC PANEL
ALT: 11 U/L (ref 0–35)
AST: 12 U/L (ref 0–37)
Albumin: 3.9 g/dL (ref 3.5–5.2)
Alkaline Phosphatase: 58 U/L (ref 39–117)
BUN: 11 mg/dL (ref 6–23)
CO2: 31 mEq/L (ref 19–32)
Calcium: 9.5 mg/dL (ref 8.4–10.5)
Chloride: 103 mEq/L (ref 96–112)
Creatinine, Ser: 1.01 mg/dL (ref 0.40–1.20)
GFR: 55.94 mL/min — ABNORMAL LOW (ref >60.00)
Glucose, Bld: 119 mg/dL — ABNORMAL HIGH (ref 70–99)
Potassium: 4.1 mEq/L (ref 3.5–5.1)
Sodium: 139 mEq/L (ref 135–145)
Total Bilirubin: 0.5 mg/dL (ref 0.2–1.2)
Total Protein: 6.8 g/dL (ref 6.0–8.3)

## 2019-10-01 LAB — VITAMIN D 25 HYDROXY (VIT D DEFICIENCY, FRACTURES): VITD: 42.26 ng/mL (ref 30.00–100.00)

## 2019-10-01 LAB — CBC WITH DIFFERENTIAL/PLATELET
Basophils Absolute: 0.1 10*3/uL (ref 0.0–0.1)
Basophils Relative: 1.1 % (ref 0.0–3.0)
Eosinophils Absolute: 0.2 10*3/uL (ref 0.0–0.7)
Eosinophils Relative: 2.4 % (ref 0.0–5.0)
HCT: 34.1 % — ABNORMAL LOW (ref 36.0–46.0)
Hemoglobin: 11.1 g/dL — ABNORMAL LOW (ref 12.0–15.0)
Lymphocytes Relative: 34.5 % (ref 12.0–46.0)
Lymphs Abs: 2.2 10*3/uL (ref 0.7–4.0)
MCHC: 32.6 g/dL (ref 30.0–36.0)
MCV: 84.6 fl (ref 78.0–100.0)
Monocytes Absolute: 0.4 10*3/uL (ref 0.1–1.0)
Monocytes Relative: 6.4 % (ref 3.0–12.0)
Neutro Abs: 3.6 10*3/uL (ref 1.4–7.7)
Neutrophils Relative %: 55.6 % (ref 43.0–77.0)
Platelets: 260 10*3/uL (ref 150.0–400.0)
RBC: 4.03 Mil/uL (ref 3.87–5.11)
RDW: 16.1 % — ABNORMAL HIGH (ref 11.5–15.5)
WBC: 6.5 10*3/uL (ref 4.0–10.5)

## 2019-10-01 LAB — TSH: TSH: 0.75 u[IU]/mL (ref 0.35–4.50)

## 2019-10-01 LAB — VITAMIN B12: Vitamin B-12: 504 pg/mL (ref 211–911)

## 2019-10-01 NOTE — Patient Instructions (Signed)
Please go to the lab today for blood work.  I will call you with your results. We will alter treatment regimen(s) if indicated by your results.   Your EKG looks good today.   I am having our office contact your GI provider to get you set up for a follow-up with them ASAP for ongoing weight loss and recent GI issues.   Keep hydrated and get plenty of rest. Continue well-balanced diet and stool regimen.

## 2019-10-01 NOTE — Progress Notes (Signed)
Patient presents to clinic today for hospitalization follow-up.  Hospital records available in care everywhere patient presented to outside emergency room [facility]) on 08/22/2019 for abdominal pain.  Was noted to have small bowel obstruction.  Patient was transferred to [facility] for further evaluation and surgical consult.  During hospitalization, patient underwent emergent diagnostic laparoscopy of abdomen and subsequent small bowel resection and minilaparotomy.  Was decided not to remove gallbladder at that time.  Patient was stable during hospitalization with significant improvement in pain.  Was discharged home to follow-up with general surgery, gastroenterology and PCP.  Since discharge, patient endorses no ongoing pain.  Is hydrating well and able to tolerate foods.  Notes appetite is still not what it was.  Denies fever, chills.  Having bowel movement without issue.  Denies melena, hematochezia or tenesmus.  Patient still concerned due to her ongoing weight loss over the past several months.  Has been on medications that could contribute including Adderall and GLP-1, but these have both been stopped for periods of time without any change.  Patient with history of hypothyroidism, is taking her medication as directed.  Denies significant cold intolerance, mood change, heat intolerance, jitteriness, etc.  Has noted a couple episode of increased heart rate, without chest pain or shortness of breath.  Denies lightheadedness or dizziness.  Question early beats as well.  Is unsure why her gallbladder was not removed since she was diagnosed with cholecystitis in the hospital.  Would like to follow-up with GI ASAP.  Previously seen Dr. Hilarie Fredrickson with last colonoscopy in follow-up 2019.  No concerning findings per report in EMR.  Past Medical History:  Diagnosis Date  . Chicken pox   . Depression   . Diabetes mellitus    Resolved w/Gastric Sleeve  . GERD (gastroesophageal reflux disease)   . Heart murmur    . History of kidney stones   . Hyperlipidemia    Had Gastric Sleeve  . Hypothyroidism   . MVP (mitral valve prolapse)    hx. of [>30 yrs]  . Neuromuscular disorder (HCC)    Neuropathy in both feet  . Obesity    Had Gastric Sleeve  . Thyroid disease     Current Outpatient Medications on File Prior to Visit  Medication Sig Dispense Refill  . amphetamine-dextroamphetamine (ADDERALL) 20 MG tablet Take 1 tablet (20 mg total) by mouth 2 (two) times daily. 180 tablet 0  . aspirin 81 MG tablet Take 325 mg by mouth daily.     . celecoxib (CELEBREX) 100 MG capsule TAKE 1 CAPSULE BY MOUTH TWO TIMES DAILY 180 capsule 1  . cyanocobalamin (,VITAMIN B-12,) 1000 MCG/ML injection INJECT 1000MCG INTO THE SKIN MONTHLY. 30 mL 2  . ezetimibe (ZETIA) 10 MG tablet Take 1 tablet (10 mg total) by mouth daily. (Patient not taking: Reported on 04/08/2019) 90 tablet 1  . metFORMIN (GLUCOPHAGE) 500 MG tablet TAKE 1 TABLET EVERY MORNING AND 2 TABLETS EVERY EVENING (Patient not taking: Reported on 04/08/2019) 270 tablet 1  . mupirocin cream (BACTROBAN) 2 % Apply 1 application topically 2 (two) times daily. For 5 days. 15 g 0  . ondansetron (ZOFRAN-ODT) 8 MG disintegrating tablet Take 1 tablet (8 mg total) by mouth every 8 (eight) hours as needed for nausea or vomiting. 60 tablet 0  . pantoprazole (PROTONIX) 40 MG tablet Take 1 tablet (40 mg total) by mouth every morning. Take 30 minutes before breakfast 90 tablet 1  . Semaglutide,0.25 or 0.5MG/DOS, (OZEMPIC, 0.25 OR 0.5 MG/DOSE,) 2  MG/1.5ML SOPN Inject 0.5 mg into the skin once a week. 4.5 mL 1  . SYNTHROID 175 MCG tablet TAKE 1 TABLET BY MOUTH  DAILY BEFORE BREAKFAST 90 tablet 1  . traZODone (DESYREL) 50 MG tablet TAKE 1 TABLET (50 MG TOTAL) BY MOUTH AT BEDTIME AS NEEDED FOR SLEEP. 90 tablet 0   No current facility-administered medications on file prior to visit.     Allergies  Allergen Reactions  . Codeine Nausea And Vomiting  . Morphine And Related Nausea And  Vomiting    Family History  Problem Relation Age of Onset  . Lung cancer Mother   . Colitis Mother   . Heart disease Mother   . Hypothyroidism Mother   . Lung cancer Father   . Heart disease Father   . Cancer Other   . Ulcerative colitis Other   . Alzheimer's disease Maternal Grandmother   . Heart attack Maternal Grandfather   . Heart attack Paternal Grandfather   . Breast cancer Maternal Aunt   . Alzheimer's disease Maternal Aunt   . Stroke Paternal Aunt   . Healthy Sister        x3  . Healthy Daughter        x3  . Graves' disease Son   . Colon cancer Neg Hx   . Esophageal cancer Neg Hx   . Rectal cancer Neg Hx   . Stomach cancer Neg Hx     Social History   Socioeconomic History  . Marital status: Married    Spouse name: Not on file  . Number of children: Not on file  . Years of education: Not on file  . Highest education level: Not on file  Occupational History  . Not on file  Social Needs  . Financial resource strain: Not on file  . Food insecurity    Worry: Not on file    Inability: Not on file  . Transportation needs    Medical: Not on file    Non-medical: Not on file  Tobacco Use  . Smoking status: Never Smoker  . Smokeless tobacco: Never Used  Substance and Sexual Activity  . Alcohol use: No    Comment: rare occ.  . Drug use: No  . Sexual activity: Yes  Lifestyle  . Physical activity    Days per week: Not on file    Minutes per session: Not on file  . Stress: Not on file  Relationships  . Social Herbalist on phone: Not on file    Gets together: Not on file    Attends religious service: Not on file    Active member of club or organization: Not on file    Attends meetings of clubs or organizations: Not on file    Relationship status: Not on file  Other Topics Concern  . Not on file  Social History Narrative  . Not on file   Review of Systems - See HPI.  All other ROS are negative.  Wt 163 lb (73.9 kg)   BMI 24.78 kg/m    Physical Exam Vitals reviewed.  Constitutional:      General: She is not in acute distress.    Appearance: Normal appearance. She is not ill-appearing or toxic-appearing.  HENT:     Head: Normocephalic and atraumatic.     Right Ear: Tympanic membrane normal.     Left Ear: Tympanic membrane normal.  Eyes:     Conjunctiva/sclera: Conjunctivae normal.     Pupils: Pupils  are equal, round, and reactive to light.  Cardiovascular:     Rate and Rhythm: Normal rate and regular rhythm.     Pulses: Normal pulses.     Heart sounds: Normal heart sounds.  Pulmonary:     Effort: Pulmonary effort is normal.     Breath sounds: Normal breath sounds.  Abdominal:     General: Bowel sounds are normal. There is no distension.     Palpations: Abdomen is soft. There is no mass.     Tenderness: There is no abdominal tenderness.     Hernia: No hernia is present.     Comments: Surgical scars noted with routine healing.  Musculoskeletal:     Cervical back: Neck supple.  Neurological:     General: No focal deficit present.     Mental Status: She is alert and oriented to person, place, and time.  Psychiatric:        Mood and Affect: Mood normal.     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 Cholecystitis 3. S/P small bowel resection Ileus status post bowel resection.  Eating and hydrating with good bowel output.  No concern for recurrent SBO.  Agree that she should see specialist for weight loss and further assessment of cholecystitis.  She is afebrile.  Repeat CBC and CMP today.  4. Weight loss Ongoing.  Initially felt could be related to medications.  These were stopped for period of time without change.  Will check CBC, CMP, TSH, B12 and vitamin D.  Do want her to have follow-up with GI despite recent normal colonoscopy.  Could potentially be related to chronic cholecystitis but will let GI assess. - CBC w/Diff - Comp Met (CMET) - TSH - B12 - Vitamin D (25 hydroxy)  5. Palpitations EKG  obtained today revealing normal sinus rhythm with a rate of 64 bpm.  Repeat labs today.  Suspect increased heart rate just due to recent stress on body.  If continues will consider Holter study.  TSH and CBC today. - EKG 12-Lead    This visit occurred during the SARS-CoV-2 public health emergency.  Safety protocols were in place, including screening questions prior to the visit, additional usage of staff PPE, and extensive cleaning of exam room while observing appropriate contact time as indicated for disinfecting solutions.     [name], PA-C

## 2019-10-02 ENCOUNTER — Encounter: Payer: Self-pay | Admitting: Physician Assistant

## 2019-10-02 ENCOUNTER — Other Ambulatory Visit: Payer: Self-pay | Admitting: Emergency Medicine

## 2019-10-02 ENCOUNTER — Telehealth: Payer: Self-pay | Admitting: Internal Medicine

## 2019-10-02 DIAGNOSIS — K563 Gallstone ileus: Secondary | ICD-10-CM

## 2019-10-02 DIAGNOSIS — K819 Cholecystitis, unspecified: Secondary | ICD-10-CM

## 2019-10-02 DIAGNOSIS — R634 Abnormal weight loss: Secondary | ICD-10-CM

## 2019-10-02 MED ORDER — AMPHETAMINE-DEXTROAMPHETAMINE 20 MG PO TABS: 20.0000 mg | ORAL_TABLET | Freq: Two times a day (BID) | ORAL | 0 refills | Status: DC

## 2019-10-02 NOTE — Telephone Encounter (Signed)
We received an urgent referral from pt's PCP to be seen for weight loss and cholescystitis. I spoke with pt to offer an appt with an APP next Wednesday but she declined. She states that she had a bowel obstruction and has lost 70 lbs and needs to see Dr. Hilarie Fredrickson asap. Please call her.

## 2019-10-02 NOTE — Telephone Encounter (Signed)
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 She had a SBO and had surgery 10/31. She has lost 65 pounds and has not changed her diet, states her PCP told her she needed to see Dr. Hilarie Fredrickson asap. Pt does not want to see an APP. Let pt know there are no available appointments but this would be sent to Dr. Hilarie Fredrickson for his recommendation. Please advise.

## 2019-10-02 NOTE — Telephone Encounter (Signed)
Please see if Dr. Birdie Riddle is willing to fill as insurance has required 90-day supply to pay for the medication and I am unable to write for this

## 2019-10-02 NOTE — Addendum Note (Signed)
Addended by: Midge Minium on: 10/02/2019 08:01 AM   Modules accepted: Orders

## 2019-10-03 NOTE — Telephone Encounter (Signed)
Pt scheduled to see Dr. Hilarie Fredrickson 10/07/19@10 :10am. Left pt message regarding appt.

## 2019-10-03 NOTE — Telephone Encounter (Signed)
Schedule changes per our discussion to make appt available

## 2019-10-06 ENCOUNTER — Encounter: Payer: Self-pay | Admitting: *Deleted

## 2019-10-06 NOTE — Telephone Encounter (Signed)
Can we see if patient can be seen by NP or PA at Dr. Vena Rua office. She has been having continued unexplained weight loss and want her to be seen ASAP.

## 2019-10-07 ENCOUNTER — Other Ambulatory Visit: Payer: Self-pay | Admitting: Physician Assistant

## 2019-10-07 ENCOUNTER — Other Ambulatory Visit: Payer: Self-pay

## 2019-10-07 ENCOUNTER — Encounter: Payer: Self-pay | Admitting: Internal Medicine

## 2019-10-07 ENCOUNTER — Ambulatory Visit (INDEPENDENT_AMBULATORY_CARE_PROVIDER_SITE_OTHER)
Admission: RE | Admit: 2019-10-07 | Discharge: 2019-10-07 | Disposition: A | Discharge status: Home / Self Care | Payer: 59 | Source: Ambulatory Visit | Attending: Internal Medicine | Admitting: Internal Medicine

## 2019-10-07 ENCOUNTER — Ambulatory Visit (INDEPENDENT_AMBULATORY_CARE_PROVIDER_SITE_OTHER): Payer: 59 | Admitting: Internal Medicine

## 2019-10-07 ENCOUNTER — Other Ambulatory Visit (INDEPENDENT_AMBULATORY_CARE_PROVIDER_SITE_OTHER): Payer: 59

## 2019-10-07 VITALS — BP 130/72 | HR 61 | Temp 98.7°F | Ht 68.0 in | Wt 164.0 lb

## 2019-10-07 DIAGNOSIS — R109 Unspecified abdominal pain: Secondary | ICD-10-CM

## 2019-10-07 DIAGNOSIS — K632 Fistula of intestine: Secondary | ICD-10-CM

## 2019-10-07 DIAGNOSIS — Z8719 Personal history of other diseases of the digestive system: Secondary | ICD-10-CM | POA: Diagnosis not present

## 2019-10-07 DIAGNOSIS — R634 Abnormal weight loss: Secondary | ICD-10-CM

## 2019-10-07 LAB — COMPREHENSIVE METABOLIC PANEL
ALT: 11 U/L (ref 0–35)
AST: 12 U/L (ref 0–37)
Albumin: 4 g/dL (ref 3.5–5.2)
Alkaline Phosphatase: 61 U/L (ref 39–117)
BUN: 11 mg/dL (ref 6–23)
CO2: 29 mEq/L (ref 19–32)
Calcium: 9.4 mg/dL (ref 8.4–10.5)
Chloride: 103 mEq/L (ref 96–112)
Creatinine, Ser: 0.78 mg/dL (ref 0.40–1.20)
GFR: 75.37 mL/min (ref >60.00)
Glucose, Bld: 118 mg/dL — ABNORMAL HIGH (ref 70–99)
Potassium: 3.6 mEq/L (ref 3.5–5.1)
Sodium: 137 mEq/L (ref 135–145)
Total Bilirubin: 0.5 mg/dL (ref 0.2–1.2)
Total Protein: 7 g/dL (ref 6.0–8.3)

## 2019-10-07 LAB — CBC WITH DIFFERENTIAL/PLATELET
Basophils Absolute: 0.1 10*3/uL (ref 0.0–0.1)
Basophils Relative: 1 % (ref 0.0–3.0)
Eosinophils Absolute: 0.2 10*3/uL (ref 0.0–0.7)
Eosinophils Relative: 3 % (ref 0.0–5.0)
HCT: 34.3 % — ABNORMAL LOW (ref 36.0–46.0)
Hemoglobin: 11.2 g/dL — ABNORMAL LOW (ref 12.0–15.0)
Lymphocytes Relative: 43 % (ref 12.0–46.0)
Lymphs Abs: 2.6 10*3/uL (ref 0.7–4.0)
MCHC: 32.6 g/dL (ref 30.0–36.0)
MCV: 84.2 fl (ref 78.0–100.0)
Monocytes Absolute: 0.4 10*3/uL (ref 0.1–1.0)
Monocytes Relative: 6.8 % (ref 3.0–12.0)
Neutro Abs: 2.8 10*3/uL (ref 1.4–7.7)
Neutrophils Relative %: 46.2 % (ref 43.0–77.0)
Platelets: 262 10*3/uL (ref 150.0–400.0)
RBC: 4.07 Mil/uL (ref 3.87–5.11)
RDW: 16.3 % — ABNORMAL HIGH (ref 11.5–15.5)
WBC: 6.1 10*3/uL (ref 4.0–10.5)

## 2019-10-07 MED ORDER — IOHEXOL 300 MG/ML  SOLN
100.0000 mL | Freq: Once | INTRAMUSCULAR | Status: AC | PRN
  Administered 2019-10-07: 13:00:00 100 mL via INTRAVENOUS

## 2019-10-07 NOTE — Patient Instructions (Signed)
Your provider has requested that you go to the basement level for lab work before leaving today. Press "B" on the elevator. The lab is located at the first door on the left as you exit the elevator.  You have been scheduled for a CT scan of the abdomen and pelvis at [facility] ([address]---this is in the same building as [employer]).   You are scheduled on TODAY at 1:45 PM. You should arrive 15 minutes prior to your appointment time for registration. Please follow the written instructions below on the day of your exam:  WARNING: IF YOU ARE ALLERGIC TO IODINE/X-RAY DYE, PLEASE NOTIFY RADIOLOGY IMMEDIATELY AT [phone]! YOU WILL BE GIVEN A 13 HOUR PREMEDICATION PREP.  1) Do not eat or drink anything after NOW (4 hours prior to your test) 2) You have been given 2 bottles of oral contrast to drink. The solution may taste better if refrigerated, but do NOT add ice or any other liquid to this solution. Shake well before drinking.    Drink 1 bottle of contrast @ 12:15 PM (2 hours prior to your exam)  Drink 1 bottle of contrast @ 1:15 PM (1 hour prior to your exam)  You may take any medications as prescribed with a small amount of water, if necessary. If you take any of the following medications: METFORMIN, GLUCOPHAGE, GLUCOVANCE, AVANDAMET, RIOMET, FORTAMET, [place] MET, JANUMET, GLUMETZA or METAGLIP, you MAY be asked to HOLD this medication 48 hours AFTER the exam.  The purpose of you drinking the oral contrast is to aid in the visualization of your intestinal tract. The contrast solution may cause some diarrhea. Depending on your individual set of symptoms, you may also receive an intravenous injection of x-ray contrast/dye. Plan on being at [facility] for 30 minutes or longer, depending on the type of exam you are having performed.  This test typically takes 30-45 minutes to complete.  If you have any questions regarding your exam or if you need to  reschedule, you may call the CT department at [phone] between the hours of 8:00 am and 5:00 pm, Monday-Friday.  _______________________________________________________  If you are age 20 or older, your body mass index should be between 23-30. Your Body mass index is 24.94 kg/m. If this is out of the aforementioned range listed, please consider follow up with your Primary Care Provider.  If you are age 69 or younger, your body mass index should be between 19-25. Your Body mass index is 24.94 kg/m. If this is out of the aformentioned range listed, please consider follow up with your Primary Care Provider.

## 2019-10-07 NOTE — Progress Notes (Signed)
Subjective:    Patient ID: Laurie Krueger, female    DOB: Dec 12, 1960, 58 y.o.   MRN: [id number]  HPI Laurie Krueger is a 58 year old female with a past medical history of obesity status post gastric sleeve in 2013, history of adenomatous colon polyp, cecal diverticulum, unexplained weight loss this year, recent gallstone ileus requiring emergency surgery with small bowel resection who is seen for follow-up.  She is here alone today.  She was last seen by Nicoletta Ba, PA-C in June 2020.  At that visit they were evaluating weight loss and Amy recommended a CT but her insurance required ultrasound first.  [place] so imaging was not done at that time.  She got sick in late October when she developed abdominal pain, distention, nausea and vomiting.  This started abruptly and she was transferred to the hospital from [place], [place].  There she presented with cholecystitis, small bowel obstruction felt secondary to gallstone ileus.  She underwent diagnostic laparoscopy and segmental small bowel resection. The surgery revealed murky abdominal fluid, dense inflammatory changes encompassing the gallbladder, adhesions from the anterior abdominal wall and small bowel including terminal ileum to the right lower quadrant and sigmoid.  No gallstones were identified within the small bowel but there was question if 1 had already become dislodged into the colon.  Portion of the small bowel was thickened and ulcerated and resected.  There was nodular implants at the peritoneum which was also sampled.  Pathology results were benign with no evidence of malignancy or inflammatory bowel disease.  She was told 35 cm of small bowel was resected.  She has recovered well.  She is back to work.  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  She is having some intermittent pain at the umbilicus near the top  of the incision.  This is random but when it occurs feels like a "tearing pain".  Not constant.  Seem to start when she increased activity level particularly after playing tennis.  Her weight has seemingly stabilized and that it was 166 on November 15 and it is now 164.  Though she has lost since her colonoscopy which was in December 2019 from 219 to her current weight.  Appetite has been good.  No nausea or vomiting.  Bowel movements have been regular.  No diarrhea, constipation, blood in stool or melena.  She did have changes to her diabetic medication and Ozempic was stopped when it was hypothesized that it may have caused weight loss.  Of note she did not have preceding nausea vomiting epigastric or right upper quadrant pain prior to being told she had cholecystitis.  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   Review of Systems As per HPI, otherwise negative  Current Medications, Allergies, Past Medical History, Past Surgical History, Family History and Social History were reviewed in [employer] record.     Objective:   Physical Exam BP 130/72 (BP Location: Left Arm, Patient Position: Sitting)   Pulse 61   Temp 98.7 F (37.1 C)   Ht 5\' 8"  (1.727 m)   Wt 164 lb (74.4 kg)   SpO2 99%   BMI 24.94 kg/m  Gen: awake, alert, NAD HEENT: anicteric, op clear CV: RRR, no mrg Pulm: CTA b/l Abd: soft, well-healed vertical laparotomy scar around the umbilicus.  There is subcutaneous  thickening likely due to sutures inferior to the scar just above the pubic symphysis, nondistended, +BS throughout Ext: no c/c/e Neuro: nonfocal  CT scan dated 08/22/2019 with IV contrast only --report will be scanned.  It showed small bowel obstruction with zone of transition in the right lower quadrant.  Findings suggestive of acute cholecystitis.  Pneumobilia was present.  The liver spleen pancreas adrenal glands and kidneys appeared normal.  There was marked  thickening of the gallbladder wall as well as mucosal enhancement.      Assessment & Plan:  58 year old female with a past medical history of obesity status post gastric sleeve in 2013, history of adenomatous colon polyp, cecal diverticulum, unexplained weight loss this year, recent gallstone ileus requiring emergency surgery with small bowel resection who is seen for follow-up.   1.  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  The small bowel obstruction was felt secondary to gallstone ileus and it is reassuring that the pathology results did not show malignancy or IBD (pathology results are available through care everywhere and I reviewed them today).  Her gallbladder was not removed, which concerns her, though it appears that the gallbladder decompressed itself by way of the fistula between the gallbladder and the duodenum.  If this fistula remains open she may not need cholecystectomy but I do recommend cross-sectional imaging to reassess both the liver and gallbladder but also the bowel.  Her weight loss earlier this year could have been medicine related but also could have been due to a chronic cholecystitis that was relatively asymptomatic prior to dramatic presentation. --CBC, CMP --Repeat CT scan of the abdomen and pelvis with contrast --If CT shows any abnormality or there is any question of gallbladder/bile ducts then we will follow-up with MRI/MRCP  2.  History of colon polyps --up-to-date with colonoscopy, her next colonoscopy would be recommended in December 2024

## 2019-10-09 ENCOUNTER — Telehealth: Payer: Self-pay | Admitting: Internal Medicine

## 2019-10-09 ENCOUNTER — Other Ambulatory Visit: Payer: Self-pay

## 2019-10-09 DIAGNOSIS — R634 Abnormal weight loss: Secondary | ICD-10-CM

## 2019-10-09 DIAGNOSIS — R109 Unspecified abdominal pain: Secondary | ICD-10-CM

## 2019-10-09 DIAGNOSIS — K828 Other specified diseases of gallbladder: Secondary | ICD-10-CM

## 2019-10-09 NOTE — Telephone Encounter (Signed)
Pt scheduled to see Dr. Redmond Pulling with [facility] 10/22/19@11 :30am, pt to arrive there at 11am. Pt notified of appt via mychart.

## 2019-10-09 NOTE — Telephone Encounter (Signed)
Can we please try and move the MRI up to asap. Also, please see if [facility] can see her in the next several days

## 2019-10-09 NOTE — Telephone Encounter (Signed)
Order faxed per request.

## 2019-10-14 NOTE — Telephone Encounter (Signed)
MRI/MRCP as attached, see my recent telephone note documenting contact with the patient and plan

## 2019-10-14 NOTE — Telephone Encounter (Signed)
I spoke to the patient yesterday by phone to review the MRI/MRCP which was done closer to her home.  I also reviewed this report with my partner, Dr. Oretha Caprice.  There continues to be thickening and inflammatory changes of the gallbladder.  There was no evidence of biliary obstruction and this fits with her normal liver enzymes.  She is scheduled to see Dr. Redmond Pulling on 10/22/2019 to consider cholecystectomy. After discussion with Dr. Ardis Hughs, we feel that she should see surgery first to discuss cholecystectomy.  If surgery feels that ERCP is warranted prior to surgery then we can get this scheduled with the patient.  However, if surgery is planned, ERCP may not be needed.  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  We will remain available and await the plan between she and Dr. Redmond Pulling

## 2019-10-28 ENCOUNTER — Other Ambulatory Visit: Payer: Self-pay

## 2019-10-28 ENCOUNTER — Telehealth: Payer: Self-pay

## 2019-10-28 DIAGNOSIS — K839 Disease of biliary tract, unspecified: Secondary | ICD-10-CM

## 2019-10-28 DIAGNOSIS — K828 Other specified diseases of gallbladder: Secondary | ICD-10-CM

## 2019-10-28 DIAGNOSIS — R634 Abnormal weight loss: Secondary | ICD-10-CM

## 2019-10-28 DIAGNOSIS — R112 Nausea with vomiting, unspecified: Secondary | ICD-10-CM

## 2019-10-28 DIAGNOSIS — R109 Unspecified abdominal pain: Secondary | ICD-10-CM

## 2019-10-28 NOTE — Telephone Encounter (Signed)
-----   Message from Milus Banister, MD sent at 10/28/2019  5:21 AM EST ----- Regarding: RE: EUS/ERCP Defintely.  I'll have Maryetta Shafer get in touch with her to schedule it.  Walida Cajas, She needs EUS and ERCP, I should have time in the next 2-3 weeks I think.  For abnormal gallbladder, bile ducts.  Thanks  DJ ----- Message ----- From: Jerene Bears, MD Sent: 10/27/2019   4:46 PM EST To: Milus Banister, MD Subject: Vincente Liberty This is the patient we discussed before the holiday.  She saw Greer Pickerel who thinks EUS +/- ERCP before cholecystectomy. You still okay with this? Thanks Ulice Dash ----- Message ----- From: Greer Pickerel, MD Sent: 10/24/2019   9:54 AM EST To: Jerene Bears, MD  Hannah Beat,  I saw this lady on Wednesday.  I think she needs to have an EUS/ERCP to evaluate her bile ducts to make sure there is nothing overtly concerning like a malignancy although my suspicion is quite low.  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  Since she has no ongoing evidence of gallstones we discussed observation versus interval cholecystectomy but I think she needs to have endoscopic evaluation of her bile ducts just to make sure there is no underlying pathology given the fact that she had 50 pound weight loss last year, ",murky fluid" seen during her exp lap at [facility] - without cytology sent.   Thanks Randall Hiss

## 2019-10-28 NOTE — Telephone Encounter (Signed)
Dr Hilarie Fredrickson and Dr Ardis Hughs I called the pt to give her the appt information for EUS/ERCP and she told me that she can not get COVID testing or quarantine prior to the EUS ERCP.  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  She asked me to cancel and she will get it completed in [place].

## 2019-10-28 NOTE — Telephone Encounter (Signed)
I sent patient an email.   Just to let her know the testing requirements are firm and need to be met in order to have the EUS/ERCP in the outpatient hospital setting.

## 2019-10-31 ENCOUNTER — Telehealth: Payer: Self-pay

## 2019-10-31 NOTE — Telephone Encounter (Signed)
Would schedule for 1/25 with Dr. Rush Landmark. I can discuss the case with him (as I had previously only done so with Dr. Ardis Hughs). This is to eval gallbadder/bile ducts before probable cholecystectomy. Thanks to all [facility]

## 2019-10-31 NOTE — Telephone Encounter (Signed)
Case discussed with Dr. Hilarie Fredrickson.  Evaluation of the gallbladder as well as the bile duct to ensure no evidence of biliary disease such as choledocholithiasis is reasonable.  I think it is reasonable for the patient's procedure to be scheduled as an EUS/ERCP but as a 2-hour slot total in case I do find choledocholithiasis we can move forward with ERCP. EUS linear plus possible ERCP. Thanks. GM

## 2019-10-31 NOTE — Telephone Encounter (Signed)
Mansouraty, Laurie Nab., Laurie Krueger You; Laurie Krueger, Laurie Lines, Laurie Krueger 3 minutes ago (3:43 PM)     Case discussed with Laurie. Hilarie Krueger. Evaluation of the gallbladder as well as the bile duct to ensure no evidence of biliary disease such as choledocholithiasis is reasonable. I think it is reasonable for the patient's procedure to be scheduled as an EUS/ERCP but as Krueger 2-hour slot total in case I do find choledocholithiasis we can move forward with ERCP. EUS linear plus possible ERCP. Thanks. GM      Documentation     You Daiva Eves Krueger 3 hours ago (12:17 PM)   I will contact you once Laurie Laurie Krueger and Laurie Krueger have Krueger chance to discuss your case.   This MyChart message has not been read.   You routed conversation to Pyrtle, Laurie Lines, Laurie Krueger; Mansouraty, Laurie Nab., Laurie Krueger 3 hours ago (12:17 PM)   You 3 hours ago (12:17 PM)     Laurie Krueger please advise specifics for this case.      Documentation     Pyrtle, Laurie Lines, Laurie Krueger routed conversation to You; Mansouraty, Laurie Nab., Laurie Krueger 3 hours ago (12:09 PM)   Laurie Bears, Laurie Krueger 3 hours ago (12:09 PM)     Would schedule for 1/25 with Laurie. Rush Krueger.  I can discuss the case with him (as I had previously only done so with Laurie Krueger).  This is to eval gallbadder/bile ducts before probable cholecystectomy.  Thanks to all  JMP       Documentation     Laurie Krueger, Laurie Broom, Laurie Krueger 3 hours ago (11:57 AM)   I can do 1/25 can not do 2/15     You routed conversation to Pyrtle, Laurie Lines, Laurie Krueger 4 hours ago (11:38 AM)   You 4 hours ago (11:38 AM)     Laurie Laurie Krueger please see my response to the pt message. Thank you       Documentation     You Laurie Krueger, Laurie Krueger 4 hours ago (11:36 AM)   Laurie Krueger has 1/25 and 2/15 available you would have to come here for COVID testing on the Thursday prior to the procedure and quarantine from then until the appt. Even with the vaccine you will still need to be tested. I will cc Laurie Laurie Krueger on this as well. Let me know if you would  like either of those dates.     Laurie Huxley, Laurie Krueger routed conversation to [employer] 2 days ago   Wanda Plump, Laurie Krueger, Laurie Lines, Laurie Krueger 3 days ago   Can we check with Laurie. Rush Krueger Mondays would be better because I could quarantine on weekend. That would only mean one or two days out of clinic. I have had Moderna Vaccine if that changes anything (first dose).  Laurie Krueger, Laurie Lines, Laurie Krueger Daiva Eves Krueger 3 days ago   I thinking waiting would probably be okay. Especially if you are not continuing to loss weight unintentionally. Also as long as you are not having return upper abdominal pain, nausea, vomiting, etc.  Laurie Krueger only has hospital time for EUS on Thursdays. Possibly, one of my other partners, Laurie. Rush Krueger may have Mondays available, but there still would be Krueger 2-6 week wait and this would not change the need for COVID testing as these are done in the outpatient hospital setting.   Keep me posted.  Zenovia Jarred     You routed conversation to Pyrtle, Laurie Lines, Laurie Krueger  3 days ago   Margaretann Loveless, Laurie Lines, Laurie Krueger 3 days ago   Hi,  Thanks, As you know I have Krueger new clinic and am sole practitioner. I can't be out of clinic for Krueger week to get this test done. I am checking with [place] to see if there guidelines are the same. I have full slate of patients the week they suggested. I did not realize the test wasn't done in your building. I did ask if it was possible to schedule another day so that I wouldn't have to be out of clinic all week and Laurie Krueger said Laurie Krueger only did them on Thursdays and that I couldn't have COVID tests done locally.  I have hired someone who will be starting mid March, what are your thoughts in delaying until then. Fortunately I remain symptom free and weight loss seems to have stabilized.  Laurie Krueger     Pyrtle, Laurie Lines, Laurie Krueger Daiva Eves Krueger 3 days ago   Erenest Rasher,  I have been in communication with Laurie. Redmond Pulling after your appointment with him. I have also discussed EUS and ERCP  with Laurie Krueger.  I saw Krueger note from Laurie Krueger' nurse, Scammon, indicating that you want to have the test done in [place].  [place] has strict requirements regarding COVID-19 testing and quarantine prior to procedures.  This test can only be performed in the outpatient hospital setting which changes COVID-19 testing requirements.  Procedures are currently being limited due to the pandemic and community outbreak.  We are more than willing to help get this test completed if you are able to undergo preprocedure testing and quarantining per hospital protocol. I understand the hardship but this information is needed prior to cholecystectomy.  Let me know if you have questions  Happy New Year,  Zenovia Jarred, Laurie Krueger

## 2019-10-31 NOTE — Telephone Encounter (Signed)
Dr Rush Landmark please advise specifics for this case.

## 2019-10-31 NOTE — Telephone Encounter (Signed)
Dr Hilarie Fredrickson please see my response to the pt message.  Thank you

## 2019-11-03 ENCOUNTER — Other Ambulatory Visit: Payer: Self-pay

## 2019-11-03 DIAGNOSIS — K839 Disease of biliary tract, unspecified: Secondary | ICD-10-CM

## 2019-11-03 DIAGNOSIS — R634 Abnormal weight loss: Secondary | ICD-10-CM

## 2019-11-03 DIAGNOSIS — R109 Unspecified abdominal pain: Secondary | ICD-10-CM

## 2019-11-03 DIAGNOSIS — K828 Other specified diseases of gallbladder: Secondary | ICD-10-CM

## 2019-11-03 DIAGNOSIS — R112 Nausea with vomiting, unspecified: Secondary | ICD-10-CM

## 2019-11-03 NOTE — Telephone Encounter (Signed)
The pt has been scheduled for 1/25 at [facility] with Mansouraty for EUS/ERCP.  COVID testing on 1/21. All information sent to the pt via My Chart.  (this has been the main communication with the pt.)

## 2019-11-10 ENCOUNTER — Other Ambulatory Visit (HOSPITAL_COMMUNITY): Payer: 59

## 2019-11-11 ENCOUNTER — Telehealth: Payer: Self-pay | Admitting: Gastroenterology

## 2019-11-11 NOTE — Telephone Encounter (Signed)
Reschedule for 3-4 weeks from diagnosis of COVID. I would place as a Category 2, since it has implications to possible need for surgery. Thanks. GM

## 2019-11-11 NOTE — Telephone Encounter (Signed)
Tried to reach the pt voice mail is full.  I will cancel the appt for 11/17/19.  Please advise of timing to reschedule procedure

## 2019-11-11 NOTE — Telephone Encounter (Signed)
No answer no voice mail The pt has been rescheduled to 12/15/19.  I have sent her the new instructions via My Chart.

## 2019-11-13 ENCOUNTER — Ambulatory Visit (HOSPITAL_COMMUNITY): Admit: 2019-11-13 | Payer: 59 | Admitting: Gastroenterology

## 2019-11-13 ENCOUNTER — Encounter (HOSPITAL_COMMUNITY): Payer: Self-pay

## 2019-11-13 ENCOUNTER — Other Ambulatory Visit (HOSPITAL_COMMUNITY): Payer: 59

## 2019-11-13 SURGERY — ENDOSCOPIC RETROGRADE CHOLANGIOPANCREATOGRAPHY (ERCP) WITH PROPOFOL
Anesthesia: General

## 2019-12-17 ENCOUNTER — Other Ambulatory Visit: Payer: Self-pay | Admitting: Family Medicine

## 2019-12-17 ENCOUNTER — Other Ambulatory Visit: Payer: Self-pay | Admitting: Physician Assistant

## 2019-12-17 DIAGNOSIS — F909 Attention-deficit hyperactivity disorder, unspecified type: Secondary | ICD-10-CM

## 2019-12-18 MED ORDER — ONDANSETRON 8 MG PO TBDP: 8.0000 mg | ORAL_TABLET | Freq: Three times a day (TID) | ORAL | 0 refills | Status: DC | PRN

## 2019-12-18 MED ORDER — AMPHETAMINE-DEXTROAMPHETAMINE 20 MG PO TABS: 20.0000 mg | ORAL_TABLET | Freq: Two times a day (BID) | ORAL | 0 refills | Status: DC

## 2019-12-18 NOTE — Telephone Encounter (Signed)
Last OV 10/01/19 adderall last filled 10/02/19 #180 with 0

## 2019-12-18 NOTE — Telephone Encounter (Signed)
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

## 2019-12-19 ENCOUNTER — Other Ambulatory Visit: Payer: Self-pay

## 2019-12-19 ENCOUNTER — Encounter (HOSPITAL_COMMUNITY): Payer: Self-pay | Admitting: Gastroenterology

## 2019-12-19 ENCOUNTER — Encounter: Payer: Self-pay | Admitting: Physician Assistant

## 2019-12-19 MED ORDER — CELECOXIB 100 MG PO CAPS: ORAL_CAPSULE | ORAL | 1 refills | Status: AC

## 2019-12-19 NOTE — Progress Notes (Signed)
Spoke with Laurie Krueger for pre-op call. Laurie Krueger has hx of Mitral Valve Prolapse, has seen Dr. Ottie Glazier within the last 5 years and was told that it was "minimal". Laurie Krueger is diabetic, no longer is treated for it since her gastric sleeve surgery several years ago. Last A1C was 6.2 in October.  Laurie Krueger tested positive for Covid in January, 2021. Laurie Krueger has written documentation and will bring with her day of procedure. Instructed to hold Multivitamins and NSAIDS prior to procedure.  Echo - 09/24/14  In Epic EKG - 10/01/19   In Epic

## 2019-12-22 ENCOUNTER — Ambulatory Visit (HOSPITAL_COMMUNITY)
Admission: RE | Admit: 2019-12-22 | Discharge: 2019-12-22 | Disposition: A | Discharge status: Home / Self Care | Payer: 59 | Attending: Gastroenterology | Admitting: Gastroenterology

## 2019-12-22 ENCOUNTER — Ambulatory Visit (HOSPITAL_COMMUNITY): Payer: 59 | Admitting: Certified Registered Nurse Anesthetist

## 2019-12-22 ENCOUNTER — Other Ambulatory Visit: Payer: Self-pay

## 2019-12-22 ENCOUNTER — Other Ambulatory Visit: Payer: Self-pay | Admitting: Physician Assistant

## 2019-12-22 ENCOUNTER — Encounter (HOSPITAL_COMMUNITY)
Admission: RE | Disposition: A | Discharge status: Home / Self Care | Payer: Self-pay | Source: Home / Self Care | Attending: Gastroenterology

## 2019-12-22 ENCOUNTER — Encounter (HOSPITAL_COMMUNITY): Payer: Self-pay | Admitting: Gastroenterology

## 2019-12-22 DIAGNOSIS — E785 Hyperlipidemia, unspecified: Secondary | ICD-10-CM | POA: Insufficient documentation

## 2019-12-22 DIAGNOSIS — K222 Esophageal obstruction: Secondary | ICD-10-CM

## 2019-12-22 DIAGNOSIS — I899 Noninfective disorder of lymphatic vessels and lymph nodes, unspecified: Secondary | ICD-10-CM | POA: Diagnosis not present

## 2019-12-22 DIAGNOSIS — J449 Chronic obstructive pulmonary disease, unspecified: Secondary | ICD-10-CM | POA: Diagnosis not present

## 2019-12-22 DIAGNOSIS — K828 Other specified diseases of gallbladder: Secondary | ICD-10-CM

## 2019-12-22 DIAGNOSIS — R634 Abnormal weight loss: Secondary | ICD-10-CM

## 2019-12-22 DIAGNOSIS — I341 Nonrheumatic mitral (valve) prolapse: Secondary | ICD-10-CM | POA: Diagnosis not present

## 2019-12-22 DIAGNOSIS — R112 Nausea with vomiting, unspecified: Secondary | ICD-10-CM

## 2019-12-22 DIAGNOSIS — Z9884 Bariatric surgery status: Secondary | ICD-10-CM | POA: Insufficient documentation

## 2019-12-22 DIAGNOSIS — R109 Unspecified abdominal pain: Secondary | ICD-10-CM

## 2019-12-22 DIAGNOSIS — E669 Obesity, unspecified: Secondary | ICD-10-CM | POA: Diagnosis not present

## 2019-12-22 DIAGNOSIS — K449 Diaphragmatic hernia without obstruction or gangrene: Secondary | ICD-10-CM | POA: Diagnosis not present

## 2019-12-22 DIAGNOSIS — E039 Hypothyroidism, unspecified: Secondary | ICD-10-CM | POA: Diagnosis not present

## 2019-12-22 DIAGNOSIS — E119 Type 2 diabetes mellitus without complications: Secondary | ICD-10-CM | POA: Diagnosis not present

## 2019-12-22 DIAGNOSIS — K209 Esophagitis, unspecified without bleeding: Secondary | ICD-10-CM

## 2019-12-22 DIAGNOSIS — K839 Disease of biliary tract, unspecified: Secondary | ICD-10-CM

## 2019-12-22 DIAGNOSIS — R935 Abnormal findings on diagnostic imaging of other abdominal regions, including retroperitoneum: Secondary | ICD-10-CM | POA: Diagnosis not present

## 2019-12-22 DIAGNOSIS — K2100 Gastro-esophageal reflux disease with esophagitis, without bleeding: Secondary | ICD-10-CM | POA: Diagnosis present

## 2019-12-22 DIAGNOSIS — K838 Other specified diseases of biliary tract: Secondary | ICD-10-CM

## 2019-12-22 HISTORY — PX: EUS: SHX5427

## 2019-12-22 HISTORY — DX: Anemia, unspecified: D64.9

## 2019-12-22 HISTORY — DX: Other specified postprocedural states: Z98.890

## 2019-12-22 HISTORY — PX: ESOPHAGOGASTRODUODENOSCOPY (EGD) WITH PROPOFOL: SHX5813

## 2019-12-22 HISTORY — DX: Nausea with vomiting, unspecified: R11.2

## 2019-12-22 HISTORY — DX: Pneumonia, unspecified organism: J18.9

## 2019-12-22 HISTORY — PX: BIOPSY: SHX5522

## 2019-12-22 LAB — HEPATIC FUNCTION PANEL
ALT: 17 U/L (ref 0–44)
AST: 17 U/L (ref 15–41)
Albumin: 3.3 g/dL — ABNORMAL LOW (ref 3.5–5.0)
Alkaline Phosphatase: 42 U/L (ref 38–126)
Bilirubin, Direct: 0.1 mg/dL (ref 0.0–0.2)
Indirect Bilirubin: 0.7 mg/dL (ref 0.3–0.9)
Total Bilirubin: 0.8 mg/dL (ref 0.3–1.2)
Total Protein: 6.1 g/dL — ABNORMAL LOW (ref 6.5–8.1)

## 2019-12-22 SURGERY — UPPER ENDOSCOPIC ULTRASOUND (EUS) RADIAL
Anesthesia: Monitor Anesthesia Care

## 2019-12-22 MED ORDER — ONDANSETRON HCL 4 MG/2ML IJ SOLN
INTRAMUSCULAR | Status: DC | PRN
  Administered 2019-12-22: 4 mg via INTRAVENOUS

## 2019-12-22 MED ORDER — LACTATED RINGERS IV SOLN
INTRAVENOUS | Status: AC | PRN
  Administered 2019-12-22: 1000 mL via INTRAVENOUS

## 2019-12-22 MED ORDER — PROPOFOL 500 MG/50ML IV EMUL
INTRAVENOUS | Status: DC | PRN
  Administered 2019-12-22: 100 ug/kg/min via INTRAVENOUS

## 2019-12-22 MED ORDER — DEXAMETHASONE SODIUM PHOSPHATE 10 MG/ML IJ SOLN
INTRAMUSCULAR | Status: DC | PRN
  Administered 2019-12-22: 5 mg via INTRAVENOUS

## 2019-12-22 MED ORDER — LACTATED RINGERS IV SOLN: INTRAVENOUS | Status: DC | PRN

## 2019-12-22 MED ORDER — LIDOCAINE 2% (20 MG/ML) 5 ML SYRINGE
INTRAMUSCULAR | Status: DC | PRN
  Administered 2019-12-22: 80 mg via INTRAVENOUS

## 2019-12-22 MED ORDER — PANTOPRAZOLE SODIUM 40 MG PO TBEC: 40.0000 mg | DELAYED_RELEASE_TABLET | Freq: Two times a day (BID) | ORAL | 4 refills | Status: DC

## 2019-12-22 MED ORDER — GLUCAGON HCL RDNA (DIAGNOSTIC) 1 MG IJ SOLR
INTRAMUSCULAR | Status: AC
  Filled 2019-12-22: qty 1

## 2019-12-22 MED ORDER — SCOPOLAMINE 1 MG/3DAYS TD PT72
MEDICATED_PATCH | TRANSDERMAL | Status: AC
  Filled 2019-12-22: qty 1

## 2019-12-22 MED ORDER — CIPROFLOXACIN IN D5W 400 MG/200ML IV SOLN
INTRAVENOUS | Status: AC
  Filled 2019-12-22: qty 200

## 2019-12-22 MED ORDER — SCOPOLAMINE 1 MG/3DAYS TD PT72
1.0000 | MEDICATED_PATCH | TRANSDERMAL | Status: DC
  Administered 2019-12-22: 1.5 mg via TRANSDERMAL

## 2019-12-22 MED ORDER — PROPOFOL 10 MG/ML IV BOLUS
INTRAVENOUS | Status: DC | PRN
  Administered 2019-12-22: 20 mg via INTRAVENOUS
  Administered 2019-12-22: 40 mg via INTRAVENOUS
  Administered 2019-12-22 (×3): 20 mg via INTRAVENOUS
  Administered 2019-12-22: 40 mg via INTRAVENOUS

## 2019-12-22 MED ORDER — SODIUM CHLORIDE 0.9 % IV SOLN: INTRAVENOUS | Status: DC

## 2019-12-22 SURGICAL SUPPLY — 14 items

## 2019-12-22 NOTE — Discharge Instructions (Signed)
Monitored Anesthesia Care Anesthesia is a term that refers to techniques, procedures, and medicines that help a person stay safe and comfortable during a medical procedure. Monitored anesthesia care, or sedation, is one type of anesthesia. Your anesthesia specialist may recommend sedation if you will be having a procedure that does not require you to be unconscious, such as:  Cataract surgery.  A dental procedure.  A biopsy.  A colonoscopy. During the procedure, you may receive a medicine to help you relax (sedative). There are three levels of sedation:  Mild sedation. At this level, you may feel awake and relaxed. You will be able to follow directions.  Moderate sedation. At this level, you will be sleepy. You may not remember the procedure.  Deep sedation. At this level, you will be asleep. You will not remember the procedure. The more medicine you are given, the deeper your level of sedation will be. Depending on how you respond to the procedure, the anesthesia specialist may change your level of sedation or the type of anesthesia to fit your needs. An anesthesia specialist will monitor you closely during the procedure. Let your health care provider know about:  Any allergies you have.  All medicines you are taking, including vitamins, herbs, eye drops, creams, and over-the-counter medicines.  Any use of steroids (by mouth or as a cream).  Any problems you or family members have had with sedatives and anesthetic medicines.  Any blood disorders you have.  Any surgeries you have had.  Any medical conditions you have, such as sleep apnea.  Whether you are pregnant or may be pregnant.  Any use of cigarettes, alcohol, or street drugs. What are the risks? Generally, this is a safe procedure. However, problems may occur, including:  Getting too much medicine (oversedation).  Nausea.  Allergic reaction to medicines.  Trouble breathing. If this happens, a breathing tube may be  used to help with breathing. It will be removed when you are awake and breathing on your own.  Heart trouble.  Lung trouble. Before the procedure Staying hydrated Follow instructions from your health care provider about hydration, which may include:  Up to 2 hours before the procedure - you may continue to drink clear liquids, such as water, clear fruit juice, black coffee, and plain tea. Eating and drinking restrictions Follow instructions from your health care provider about eating and drinking, which may include:  8 hours before the procedure - stop eating heavy meals or foods such as meat, fried foods, or fatty foods.  6 hours before the procedure - stop eating light meals or foods, such as toast or cereal.  6 hours before the procedure - stop drinking milk or drinks that contain milk.  2 hours before the procedure - stop drinking clear liquids. Medicines Ask your health care provider about:  Changing or stopping your regular medicines. This is especially important if you are taking diabetes medicines or blood thinners.  Taking medicines such as aspirin and ibuprofen. These medicines can thin your blood. Do not take these medicines before your procedure if your health care provider instructs you not to. Tests and exams  You will have a physical exam.  You may have blood tests done to show: ? How well your kidneys and liver are working. ? How well your blood can clot. General instructions  Plan to have someone take you home from the hospital or clinic.  If you will be going home right after the procedure, plan to have someone with you  for 24 hours.  What happens during the procedure?  Your blood pressure, heart rate, breathing, level of pain and overall condition will be monitored.  An IV tube will be inserted into one of your veins.  Your anesthesia specialist will give you medicines as needed to keep you comfortable during the procedure. This may mean changing the  level of sedation.  The procedure will be performed. After the procedure  Your blood pressure, heart rate, breathing rate, and blood oxygen level will be monitored until the medicines you were given have worn off.  Do not drive for 24 hours if you received a sedative.  You may: ? Feel sleepy, clumsy, or nauseous. ? Feel forgetful about what happened after the procedure. ? Have a sore throat if you had a breathing tube during the procedure. ? Vomit. This information is not intended to replace advice given to you by your health care provider. Make sure you discuss any questions you have with your health care provider. Document Revised: 09/21/2017 Document Reviewed: 01/30/2016 Elsevier Patient Education  2020 [place] HAD AN ENDOSCOPIC PROCEDURE TODAY: Refer to the procedure report and other information in the discharge instructions given to you for any specific questions about what was found during the examination. If this information does not answer your questions, please call [place] office at [phone] to clarify.   YOU SHOULD EXPECT: Some feelings of bloating in the abdomen. Passage of more gas than usual. Walking can help get rid of the air that was put into your GI tract during the procedure and reduce the bloating. If you had a lower endoscopy (such as a colonoscopy or flexible sigmoidoscopy) you may notice spotting of blood in your stool or on the toilet paper. Some abdominal soreness may be present for a day or two, also.  DIET: Your first meal following the procedure should be a light meal and then it is ok to progress to your normal diet. A half-sandwich or bowl of soup is an example of a good first meal. Heavy or fried foods are harder to digest and may make you feel nauseous or bloated. Drink plenty of fluids but you should avoid alcoholic beverages for 24 hours. If you had a esophageal dilation, please see attached instructions for diet.    ACTIVITY: Your care partner  should take you home directly after the procedure. You should plan to take it easy, moving slowly for the rest of the day. You can resume normal activity the day after the procedure however YOU SHOULD NOT DRIVE, use power tools, machinery or perform tasks that involve climbing or major physical exertion for 24 hours (because of the sedation medicines used during the test).   SYMPTOMS TO REPORT IMMEDIATELY: A gastroenterologist can be reached at any hour. Please call [phone]  for any of the following symptoms:  Following upper endoscopy (EGD, EUS, ERCP, esophageal dilation) Vomiting of blood or coffee ground material  New, significant abdominal pain  New, significant chest pain or pain under the shoulder blades  Painful or persistently difficult swallowing  New shortness of breath  Black, tarry-looking or red, bloody stools  FOLLOW UP:  If any biopsies were taken you will be contacted by phone or by letter within the next 1-3 weeks. Call [phone]  if you have not heard about the biopsies in 3 weeks.  Please also call with any specific questions about appointments or follow up tests.

## 2019-12-22 NOTE — Anesthesia Preprocedure Evaluation (Addendum)
Anesthesia Evaluation  Patient identified by MRN, date of birth, ID band Patient awake    Reviewed: Allergy & Precautions, NPO status , Patient's Chart, lab work & pertinent test results  History of Anesthesia Complications (+) PONV and history of anesthetic complications  Airway Mallampati: I  TM Distance: >3 FB Neck ROM: Full    Dental no notable dental hx. (+) Teeth Intact, Dental Advisory Given   Pulmonary COPD,    Pulmonary exam normal breath sounds clear to auscultation       Cardiovascular negative cardio ROS Normal cardiovascular exam Rhythm:Regular Rate:Normal  TTE 2015 - Normal biventricular size and systolic function. Abnormal relaxation with normal filling pressures. Mild tricuspidregurgitation. Normal RVSP.     Neuro/Psych PSYCHIATRIC DISORDERS Depression negative neurological ROS     GI/Hepatic Neg liver ROS, GERD  Medicated,  Endo/Other  diabetes, Well ControlledHypothyroidism   Renal/GU negative Renal ROS  negative genitourinary   Musculoskeletal negative musculoskeletal ROS (+)   Abdominal   Peds  (+) ATTENTION DEFICIT DISORDER WITHOUT HYPERACTIVITY Hematology negative hematology ROS (+)   Anesthesia Other Findings   Reproductive/Obstetrics                            Anesthesia Physical Anesthesia Plan  ASA: III  Anesthesia Plan: MAC   Post-op Pain Management:    Induction: Intravenous  PONV Risk Score and Plan: 3 and Propofol infusion, Treatment may vary due to age or medical condition, Ondansetron, Dexamethasone and Scopolamine patch - Pre-op  Airway Management Planned: Natural Airway  Additional Equipment:   Intra-op Plan:   Post-operative Plan:   Informed Consent: I have reviewed the patients History and Physical, chart, labs and discussed the procedure including the risks, benefits and alternatives for the proposed anesthesia with the patient or  authorized representative who has indicated his/her understanding and acceptance.     Dental advisory given  Plan Discussed with: CRNA  Anesthesia Plan Comments:        Anesthesia Quick Evaluation

## 2019-12-22 NOTE — H&P (Signed)
GASTROENTEROLOGY PROCEDURE H&P NOTE   Primary Care Physician: Brunetta Jeans, PA-C  HPI: Laurie Krueger is a 59 y.o. female who presents for EUS +/- ERCP for possible rule out choledocholithiasis and for recent gallbladder pathology.  Past Medical History:  Diagnosis Date  . Anemia    with pregnancy  . Chicken pox   . COPD (chronic obstructive pulmonary disease) ([place])    pt denies  . Depression   . Diabetes mellitus    Resolved w/Gastric Sleeve  . Diverticulosis   . 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 GERD (gastroesophageal reflux disease)   . Heart murmur    no one has heard it in "years"  . History of kidney stones   . Hyperlipidemia    Had Gastric Sleeve  . Hypothyroidism   . Internal hemorrhoid   . MVP (mitral valve prolapse)    hx. of [>30 yrs]  . Neuromuscular disorder ([facility])    Neuropathy in both feet  . Obesity    Had Gastric Sleeve  . Pneumonia   . PONV (postoperative nausea and vomiting)   . Thyroid disease   . Tubular adenoma of colon    Past Surgical History:  Procedure Laterality Date  . ABDOMINAL HYSTERECTOMY    . CESAREAN SECTION  1995  . KIDNEY STONE SURGERY    . KNEE ARTHROSCOPY  02/2012   meniscus repair left 5'13  . LAPAROSCOPIC GASTRIC SLEEVE RESECTION  10/08/2012   Procedure: LAPAROSCOPIC GASTRIC SLEEVE RESECTION;  Surgeon: Madilyn Hook, DO;  Location: [facility];  Service: General;  Laterality: N/A;  . LAPAROSCOPIC SMALL BOWEL RESECTION    . NASAL SINUS SURGERY    . TONSILLECTOMY     Current Facility-Administered Medications  Medication Dose Route Frequency Provider Last Rate Last Admin  . 0.9 %  sodium chloride infusion   Intravenous Continuous Mansouraty, Telford Nab., MD      . lactated ringers infusion    Continuous PRN Mansouraty, Telford Nab., MD 10 mL/hr at 12/22/19 1152 1,000 mL at 12/22/19 1152  . scopolamine (TRANSDERM-SCOP) 1 MG/3DAYS 1.5 mg  1 patch Transdermal Q72H Woodrum, Chelsey L, MD   1.5 mg at 12/22/19 1219   Allergies    Allergen Reactions  . Codeine Nausea And Vomiting  . Morphine And Related Nausea And Vomiting   Family History  Problem Relation Age of Onset  . Lung cancer Mother   . Colitis Mother   . Heart disease Mother   . Hypothyroidism Mother   . Diverticulosis Mother   . Lung cancer Father   . Heart disease Father   . Cancer Other   . Ulcerative colitis Other   . Alzheimer's disease Maternal Grandmother   . Heart attack Maternal Grandfather   . Heart attack Paternal Grandfather   . Breast cancer Maternal Aunt   . Alzheimer's disease Maternal Aunt   . Stroke Paternal Aunt   . Healthy Sister        x3  . Healthy Daughter        x3  . Graves' disease Son   . Colon cancer Neg Hx   . Esophageal cancer Neg Hx   . Rectal cancer Neg Hx   . Stomach cancer Neg Hx    Social History   Socioeconomic History  . Marital status: Married    Spouse name: Not on file  . Number of children: Not on file  . Years of education: Not on file  . Highest education level: Not on file  Occupational History  . Not on file  Tobacco Use  . Smoking status: Never Smoker  . Smokeless tobacco: Never Used  Substance and Sexual Activity  . Alcohol use: Yes    Comment: rare occ. 1-2 drinks  . Drug use: No  . Sexual activity: Yes  Other Topics Concern  . Not on file  Social History Narrative  . Not on file   Social Determinants of Health   Financial Resource Strain:   . Difficulty of Paying Living Expenses: Not on file  Food Insecurity:   . Worried About Charity fundraiser in the Last Year: Not on file  . Ran Out of Food in the Last Year: Not on file  Transportation Needs:   . Lack of Transportation (Medical): Not on file  . Lack of Transportation (Non-Medical): Not on file  Physical Activity:   . Days of Exercise per Week: Not on file  . Minutes of Exercise per Session: Not on file  Stress:   . Feeling of Stress : Not on file  Social Connections:   . Frequency of Communication with Friends  and Family: Not on file  . Frequency of Social Gatherings with Friends and Family: Not on file  . Attends Religious Services: Not on file  . Active Member of Clubs or Organizations: Not on file  . Attends Archivist Meetings: Not on file  . Marital Status: Not on file  Intimate Partner Violence:   . Fear of Current or Ex-Partner: Not on file  . Emotionally Abused: Not on file  . Physically Abused: Not on file  . Sexually Abused: Not on file    Physical Exam: Vital signs in last 24 hours: Temp:  [97.9 F (36.6 C)] 97.9 F (36.6 C) (03/01 1136) Pulse Rate:  [67] 67 (03/01 1136) Resp:  [14] 14 (03/01 1136) BP: (151)/(68) 151/68 (03/01 1136) SpO2:  [100 %] 100 % (03/01 1136) Weight:  [74.8 kg] 74.8 kg (03/01 1136)   GEN: NAD EYE: Sclerae anicteric ENT: MMM CV: Non-tachycardic GI: Soft, NT/ND NEURO:  Alert & Oriented x 3  Lab Results: No results for input(s): WBC, HGB, HCT, PLT in the last 72 hours. BMET No results for input(s): NA, K, CL, CO2, GLUCOSE, BUN, CREATININE, CALCIUM in the last 72 hours. LFT No results for input(s): PROT, ALBUMIN, AST, ALT, ALKPHOS, BILITOT, BILIDIR, IBILI in the last 72 hours. PT/INR No results for input(s): LABPROT, INR in the last 72 hours.   Impression / Plan: This is a 59 y.o.female who presents for EUS +/- ERCP for possible rule out choledocholithiasis and for recent gallbladder pathology.  The risks of EUS including bleeding, infection, aspiration pneumonia and intestinal perforation were discussed as was the possibility it may not give a definitive diagnosis.  If a biopsy of the pancreas is done as part of the EUS, there is an additional risk of pancreatitis at the rate of about 1%.  It was explained that procedure related pancreatitis is typically mild, although can be severe and even life threatening, which is why we do not perform random pancreatic biopsies and only biopsy a lesion we feel is concerning enough to warrant the  risk.  The risks of an ERCP were discussed at length, including but not limited to the risk of perforation, bleeding, abdominal pain, post-ERCP pancreatitis (while usually mild can be severe and even life threatening).  The risks and benefits of endoscopic evaluation were discussed with the patient; these include but are not limited  to the risk of perforation, infection, bleeding, missed lesions, lack of diagnosis, severe illness requiring hospitalization, as well as anesthesia and sedation related illnesses.  The patient is agreeable to proceed.    Justice Britain, MD [facility] Office # [phone]

## 2019-12-22 NOTE — Anesthesia Postprocedure Evaluation (Signed)
Anesthesia Post Note  Patient: Laurie Krueger  Procedure(s) Performed: UPPER ENDOSCOPIC ULTRASOUND (EUS) RADIAL (N/A ) ESOPHAGOGASTRODUODENOSCOPY (EGD) WITH PROPOFOL (N/A ) BIOPSY     Patient location during evaluation: PACU Anesthesia Type: MAC Level of consciousness: awake and alert Pain management: pain level controlled Vital Signs Assessment: post-procedure vital signs reviewed and stable Respiratory status: spontaneous breathing, nonlabored ventilation, respiratory function stable and patient connected to nasal cannula oxygen Cardiovascular status: stable and blood pressure returned to baseline Postop Assessment: no apparent nausea or vomiting Anesthetic complications: no    Last Vitals:  Vitals:   12/22/19 1415 12/22/19 1425  BP: 108/64 139/80  Pulse: (!) 59 66  Resp: (!) 30 16  Temp:  (!) 36.2 C  SpO2: 100% 100%    Last Pain:  Vitals:   12/22/19 1425  TempSrc:   PainSc: 0-No pain                 Samara Stankowski L Niaya Hickok

## 2019-12-22 NOTE — Op Note (Signed)
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 Redmond Pulling MD, MD, Lajuan Lines. Pyrtle, MD Medicines:                Monitored Anesthesia Care Complications:            No immediate complications. Estimated Blood Loss:     Estimated blood loss was minimal. Procedure:                Pre-Anesthesia Assessment:                           - Prior to the procedure, a History and Physical                            was performed, and patient medications and                            allergies were reviewed. The patient's tolerance of                            previous anesthesia was also reviewed. The risks                            and benefits of the procedure and the sedation  options and risks were discussed with the patient.                            All questions were answered, and informed consent                            was obtained. Prior Anticoagulants: The patient has                            taken no previous anticoagulant or antiplatelet                            agents except for NSAID medication. ASA Grade                             Assessment: II - A patient with mild systemic                            disease. After reviewing the risks and benefits,                            the patient was deemed in satisfactory condition to                            undergo the procedure.                           After obtaining informed consent, the endoscope was                            passed under direct vision. Throughout the                            procedure, the patient's blood pressure, pulse, and                            oxygen saturations were monitored continuously. The                            [id number]-H190 ([id number]) Olympus gastroscope was                            introduced through the mouth, and advanced to the                            second part of duodenum. The [id number]-Q180V ([id number])                            [place] was introduced through the                            mouth, and advanced to the second part of duodenum.  The GF-[id number] ([id number]) Olympus Linear EUS scope                            was introduced through the mouth, and advanced to                            the duodenum for ultrasound examination from the                            stomach and duodenum. The upper EUS was                            accomplished without difficulty. The patient                            tolerated the procedure. Scope In: Scope Out: Findings:      ENDOSCOPIC FINDING: :      No gross lesions were noted in the proximal esophagus and in the mid       esophagus.      LA Grade B (one or more mucosal breaks greater than 5 mm, not extending       between the tops of two mucosal folds) esophagitis with no bleeding was       found in the distal esophagus into the [place].      A non-obstructing Schatzki ring was found at the gastroesophageal       junction.      A 4 cm hiatal hernia was present.      Evidence of a sleeve gastrectomy was found  in the gastric body.      Patchy mildly erythematous mucosa without bleeding was found in the       gastric body and in the gastric antrum.      No other gross lesions were noted in the entire examined stomach.       Biopsies were taken with a cold forceps for histology and Helicobacter       pylori testing.      No gross lesions were noted in the duodenal bulb, in the first portion       of the duodenum and in the second portion of the duodenum.      The major papilla was normal but hidden under a hood.      ENDOSONOGRAPHIC FINDING: :      There was dilation in the common bile duct (2.0 mm -> 7.7 mm) and in the       common hepatic duct (8.7 mm). No evidence of choledocholithiasis.      A hyperechoic lesion consistent with likely polyp was identified       endosonographically in the gallbladder body. There was no shadowing       occuring. No evidence of overt cholelithiasis.      There was no sign of significant endosonographic abnormality in the       pancreatic head (1.6 mm -> 2.5 mm), genu of the pancreas (1.9 mm -> 1.1       mm), pancreatic body (0.5 mm) and pancreatic tail (1.4 mm). No masses,       no cysts, no calcifications, the pancreatic duct was regular in contour.      Endosonographic imaging of the ampulla showed no intramural       (  subepithelial) lesion.      Endosonographic imaging in the visualized portion of the liver showed no       mass-lesion.      No malignant-appearing lymph nodes were visualized in the celiac region       (level 20), perigastric region, peripancreatic region and porta hepatis       region.      The celiac region was visualized. Impression:               EGD Impression:                           - No gross lesions in esophagus proximally. LA                            Grade B esophagitis with no bleeding distally into                            the [place]. Non-obstructing Schatzki ring at                            [employer].                            - 4 cm hiatal hernia.                           - A sleeve gastrectomy was found.                           - Erythematous mucosa in the gastric body and                            antrum. Biopsied for HP.                           - No gross lesions in the duodenal bulb, in the                            first portion of the duodenum and in the second                            portion of the duodenum.                           - Normal major papilla (under the hood).                           EUS Impression:                           - There was dilation in the common bile duct and in                            the common hepatic duct. No evidence of  choledocholithiasis.                           - A polyp was found in the gallbladder body. No                            evidence of cholelithiasis.                           - There was no sign of significant pathology in the                            pancreatic head, genu of the pancreas, pancreatic                            body and pancreatic tail.                           - Ampulla without intramural lesion.                           - Portions of visualized liver without mass-lesion.                           - No malignant-appearing lymph nodes were                            visualized in the celiac region (level 20),                            perigastric region, peripancreatic region and porta                            hepatis region. Recommendation:           - The patient will be observed post-procedure,                            until all discharge criteria are met.                           - Discharge patient to home.                           - Patient has a contact number available for                            emergencies. The signs and symptoms of potential                            delayed complications were discussed with the                            patient. Return to  normal activities tomorrow.  Written discharge instructions were provided to the                            patient.                           - Resume previous diet.                           - Observe patient's clinical course.                           - Check liver enzymes (AST, ALT, alkaline                            phosphatase, bilirubin) today.                           - Increase Protonix to 40 mg BID for next 6-8                            weeks. Will allow Dr. Hilarie Fredrickson to consider whether                            follow up to check on healing and ensure no                            Barrett's is present can be considered in 2-3                            months.                           - If patient does not want to increase PPI then                            consider addition of H2RA for symptoms. Query role                            of Gaviscon if her hiatal hernia could be playing a                            role.                           - For possible gallbladder polyp - query repeat U/S                            in 7-month to ensure nothing has changed or                            noted, since this is sub-cm.                           - In regards to question of cholecystectomy  will                            defer further discussions to Primary GI and Surgery.                           - Due to slight dilitation of the CBD, query a                            1-year follow up MRI/MRCP to ensure dramatic                            dilation is not occuring (though if CCK is                            performed then will need to keep this in mind).                           - The findings and recommendations were discussed                            with the patient.                           - The findings and recommendations were discussed                            with the patient's family. Procedure Code(s):        --- Professional  ---                           [id number], Esophagogastroduodenoscopy, flexible,                            transoral; with endoscopic ultrasound examination                            limited to the esophagus, stomach or duodenum, and                            adjacent structures                           43239, Esophagogastroduodenoscopy, flexible,                            transoral; with biopsy, single or multiple Diagnosis Code(s):        --- Professional ---                           K20.90, Esophagitis, unspecified without bleeding                           K22.2, Esophageal obstruction  K44.9, Diaphragmatic hernia without obstruction or                            gangrene                           Z98.84, Bariatric surgery status                           K31.89, Other diseases of stomach and duodenum                           I89.9, Noninfective disorder of lymphatic vessels                            and lymph nodes, unspecified                           K82.8, Other specified diseases of gallbladder                           K83.8, Other specified diseases of biliary tract                           R93.5, Abnormal findings on diagnostic imaging of                            other abdominal regions, including retroperitoneum                           R93.2, Abnormal findings on diagnostic imaging of                            liver and biliary tract CPT copyright 2019 American Medical Association. All rights reserved. The codes documented in this report are preliminary and upon coder review may  be revised to meet current compliance requirements. Justice Britain, MD 12/22/2019 2:27:23 PM Number of Addenda: 0

## 2019-12-22 NOTE — Transfer of Care (Signed)
Immediate Anesthesia Transfer of Care Note  Patient: Laurie Krueger  Procedure(s) Performed: UPPER ENDOSCOPIC ULTRASOUND (EUS) RADIAL (N/A ) ESOPHAGOGASTRODUODENOSCOPY (EGD) WITH PROPOFOL (N/A ) BIOPSY  Patient Location: PACU  Anesthesia Type:MAC  Level of Consciousness: awake and alert   Airway & Oxygen Therapy: Patient Spontanous Breathing and Patient connected to nasal cannula oxygen  Post-op Assessment: Report given to RN and Post -op Vital signs reviewed and stable  Post vital signs: Reviewed and stable  Last Vitals:  Vitals Value Taken Time  BP 124/96 12/22/19 1400  Temp 36 C 12/22/19 1400  Pulse 64 12/22/19 1405  Resp 18 12/22/19 1405  SpO2 100 % 12/22/19 1405  Vitals shown include unvalidated device data.  Last Pain:  Vitals:   12/22/19 1400  TempSrc:   PainSc: 0-No pain         Complications: No apparent anesthesia complications

## 2019-12-23 LAB — SURGICAL PATHOLOGY

## 2019-12-28 ENCOUNTER — Encounter: Payer: Self-pay | Admitting: Gastroenterology

## 2020-01-21 ENCOUNTER — Other Ambulatory Visit: Payer: Self-pay | Admitting: Physician Assistant

## 2020-01-21 ENCOUNTER — Encounter: Payer: Self-pay | Admitting: Physician Assistant

## 2020-01-21 DIAGNOSIS — F909 Attention-deficit hyperactivity disorder, unspecified type: Secondary | ICD-10-CM

## 2020-01-21 MED ORDER — AMPHETAMINE-DEXTROAMPHETAMINE 20 MG PO TABS: 20.0000 mg | ORAL_TABLET | Freq: Two times a day (BID) | ORAL | 0 refills | Status: DC

## 2020-01-21 NOTE — Telephone Encounter (Signed)
Adderall rx was sent to patient preferred pharmacy on 12/18/19 #180 LOV:10/01/19 [facility] ileus

## 2020-01-26 ENCOUNTER — Encounter: Payer: Self-pay | Admitting: Physician Assistant

## 2020-01-26 DIAGNOSIS — F909 Attention-deficit hyperactivity disorder, unspecified type: Secondary | ICD-10-CM

## 2020-01-26 MED ORDER — AMPHETAMINE-DEXTROAMPHETAMINE 20 MG PO TABS: 20.0000 mg | ORAL_TABLET | Freq: Two times a day (BID) | ORAL | 0 refills | Status: DC

## 2020-01-26 NOTE — Addendum Note (Signed)
Addended by: Brunetta Jeans on: 01/26/2020 04:24 PM   Modules accepted: Orders

## 2020-03-09 ENCOUNTER — Other Ambulatory Visit: Payer: Self-pay | Admitting: Physician Assistant

## 2020-03-09 ENCOUNTER — Encounter: Payer: Self-pay | Admitting: Physician Assistant

## 2020-03-09 DIAGNOSIS — F909 Attention-deficit hyperactivity disorder, unspecified type: Secondary | ICD-10-CM

## 2020-03-09 MED ORDER — AMPHETAMINE-DEXTROAMPHETAMINE 20 MG PO TABS: 20.0000 mg | ORAL_TABLET | Freq: Two times a day (BID) | ORAL | 0 refills | Status: DC

## 2020-03-09 NOTE — Telephone Encounter (Signed)
Adderall last rx 01/26/20 #60 LOV: 10/01/2019

## 2020-04-09 ENCOUNTER — Encounter: Payer: Self-pay | Admitting: Physician Assistant

## 2020-04-09 DIAGNOSIS — F909 Attention-deficit hyperactivity disorder, unspecified type: Secondary | ICD-10-CM

## 2020-04-09 MED ORDER — AMPHETAMINE-DEXTROAMPHETAMINE 20 MG PO TABS: 20.0000 mg | ORAL_TABLET | Freq: Two times a day (BID) | ORAL | 0 refills | Status: DC

## 2020-05-18 ENCOUNTER — Encounter: Payer: Self-pay | Admitting: Physician Assistant

## 2020-05-18 DIAGNOSIS — F909 Attention-deficit hyperactivity disorder, unspecified type: Secondary | ICD-10-CM

## 2020-05-18 NOTE — Telephone Encounter (Signed)
Adderall last rx 04/09/20 #60 LOV: 10/01/19 [facility] CSC: 10/03/2016

## 2020-05-18 NOTE — Telephone Encounter (Signed)
Patient needs appt. Overdue for 62-month follow-up.

## 2020-05-20 ENCOUNTER — Telehealth (INDEPENDENT_AMBULATORY_CARE_PROVIDER_SITE_OTHER): Payer: No Typology Code available for payment source | Admitting: Physician Assistant

## 2020-05-20 ENCOUNTER — Other Ambulatory Visit: Payer: Self-pay

## 2020-05-20 ENCOUNTER — Encounter: Payer: Self-pay | Admitting: Physician Assistant

## 2020-05-20 DIAGNOSIS — E1169 Type 2 diabetes mellitus with other specified complication: Secondary | ICD-10-CM

## 2020-05-20 DIAGNOSIS — E038 Other specified hypothyroidism: Secondary | ICD-10-CM | POA: Diagnosis not present

## 2020-05-20 DIAGNOSIS — F909 Attention-deficit hyperactivity disorder, unspecified type: Secondary | ICD-10-CM

## 2020-05-20 DIAGNOSIS — E785 Hyperlipidemia, unspecified: Secondary | ICD-10-CM

## 2020-05-20 DIAGNOSIS — E119 Type 2 diabetes mellitus without complications: Secondary | ICD-10-CM

## 2020-05-20 MED ORDER — ONDANSETRON 8 MG PO TBDP: 8.0000 mg | ORAL_TABLET | Freq: Three times a day (TID) | ORAL | 0 refills | Status: AC | PRN

## 2020-05-20 MED ORDER — AMPHETAMINE-DEXTROAMPHETAMINE 20 MG PO TABS: 20.0000 mg | ORAL_TABLET | Freq: Two times a day (BID) | ORAL | 0 refills | Status: DC

## 2020-05-20 NOTE — Progress Notes (Signed)
Virtual Visit via Video   I connected with patient on 05/20/20 at 11:00 AM EDT by a video enabled telemedicine application and verified that I am speaking with the correct person using two identifiers.  Location patient: Home Location provider: [facility], Office Persons participating in the virtual visit: Patient, Provider, [place] (Patina Moore)  I discussed the limitations of evaluation and management by telemedicine and the availability of in person appointments. The patient expressed understanding and agreed to proceed.  Subjective:   HPI:   Patient presents via [place] today for follow-up of chronic medical conditions including ADD, TSH and DM II.  ADD -- Patient is currently on a regimen of Adderall 20 mg twice daily. Patient endorses taking medications as directed with continued good result. 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   Hypothyroidism -- Currently on a regimen of Synthroid 175 mcg daily. Endorses taking as directed and tolerating well. Had her labs drawn at outside facility 3 months ago with TSH noted to be at 0.16. Records requested.   Diabetes Mellitus -- Previously controlled with complication. Controls with diet and exercise. Is due for eye exam but has this scheduled. Denies polyuria, polydipsia or polyphagia. Denies vision changes. Notes recent labs. Will fax [place] the report for review.   Lab Results  Component Value Date   HGBA1C 6.3 (H) 03/05/2019    ROS:   See pertinent positives and negatives per HPI.  Patient Active Problem List   Diagnosis Date Noted  . S/P small bowel resection 08/23/2019  . Colon cancer screening 09/19/2017  . Cervical neck pain with evidence of disc disease 08/24/2015  . ADD (attention deficit disorder) 06/29/2015  . Diabetes mellitus type II, controlled ([place]) 01/12/2015  . Hyperlipidemia associated with type 2 diabetes mellitus ([place]) 10/07/2014  . Visit for  preventive health examination 10/14/2013  . Hypothyroidism 10/14/2013  . GERD (gastroesophageal reflux disease) 10/14/2013    Social History   Tobacco Use  . Smoking status: Never Smoker  . Smokeless tobacco: Never Used  Substance Use Topics  . Alcohol use: Yes    Comment: rare occ. 1-2 drinks    Current Outpatient Medications:  .  amphetamine-dextroamphetamine (ADDERALL) 20 MG tablet, Take 1 tablet (20 mg total) by mouth 2 (two) times daily., Disp: 60 tablet, Rfl: 0 .  celecoxib (CELEBREX) 100 MG capsule, TAKE 1 CAPSULE BY MOUTH TWO TIMES DAILY, Disp: 180 capsule, Rfl: 1 .  multivitamins adult 10 mL in dextrose 5% lactated ringers 5 % 1,000 mL, Inject into the vein every 14 (fourteen) days., Disp: , Rfl:  .  ondansetron (ZOFRAN-ODT) 8 MG disintegrating tablet, Take 1 tablet (8 mg total) by mouth every 8 (eight) hours as needed for nausea or vomiting., Disp: 60 tablet, Rfl: 0 .  pantoprazole (PROTONIX) 40 MG tablet, Take 1 tablet (40 mg total) by mouth 2 (two) times daily before a meal. Take 30 minutes before breakfast, Disp: 60 tablet, Rfl: 4 .  SYNTHROID 175 MCG tablet, TAKE 1 TABLET BY MOUTH  DAILY BEFORE BREAKFAST (Patient taking differently: Take 175 mcg by mouth daily before breakfast. ), Disp: 90 tablet, Rfl: 1 .  traZODone (DESYREL) 50 MG tablet, TAKE 0.5 TABLETS (25 MG TOTAL) BY MOUTH AT BEDTIME AS NEEDED FOR SLEEP. (Patient taking differently: Take 50 mg by mouth at bedtime as needed for sleep. ), Disp: 45 tablet, Rfl: 2  Allergies  Allergen Reactions  . Codeine Nausea And Vomiting  .  Morphine And Related Nausea And Vomiting    Objective:   There were no vitals taken for this visit.  Patient is well-developed, well-nourished in no acute distress.  Resting comfortably at home.  Head is normocephalic, atraumatic.  No labored breathing.  Speech is clear and coherent with logical content.  Patient is alert and oriented at baseline.   Assessment and Plan:   1. Attention  deficit hyperactivity disorder (ADHD), unspecified ADHD type Stable. Continue current regimen. Follow-up every 6 months.  - amphetamine-dextroamphetamine (ADDERALL) 20 MG tablet; Take 1 tablet (20 mg total) by mouth 2 (two) times daily.  Dispense: 60 tablet; Refill: 0  2. Controlled type 2 diabetes mellitus without complication, without long-term current use of insulin (HCC) Previously well-controlled with diet and exercise. Will get her lab reports to review and will adjust accordingly.   3. Hyperlipidemia associated with type 2 diabetes mellitus ([place]) Recent fasting lipids. She is sending report for review. Previously declined statin. Will readdress based on findings.   4. Other specified hypothyroidism Recent TSH slightly low. Asymptomatic. Since she cannot come in for labs in the near future, she will have them drawn at a local lab near her. If still low will decrease synthroid dose.      [name], PA-C 05/20/2020

## 2020-05-20 NOTE — Progress Notes (Signed)
I have discussed the procedure for the virtual visit with the patient who has given consent to proceed with assessment and treatment.   Laurie Krueger S [name], CMA     

## 2020-06-30 ENCOUNTER — Encounter: Payer: Self-pay | Admitting: Physician Assistant

## 2020-06-30 MED ORDER — ALBUTEROL SULFATE HFA 108 (90 BASE) MCG/ACT IN AERS: 2.0000 | INHALATION_SPRAY | Freq: Four times a day (QID) | RESPIRATORY_TRACT | 0 refills | Status: AC | PRN

## 2020-06-30 NOTE — Telephone Encounter (Signed)
Ok to send in refill on inhaler but recommend she get an appointment at [facility] where she lives for further evaluation of these ongoing URI symptoms since she no longer lives in the area.

## 2020-08-17 ENCOUNTER — Encounter: Payer: Self-pay | Admitting: Emergency Medicine

## 2020-08-17 LAB — HM DIABETES EYE EXAM

## 2020-09-14 ENCOUNTER — Encounter: Payer: Self-pay | Admitting: Physician Assistant

## 2020-09-14 ENCOUNTER — Other Ambulatory Visit: Payer: Self-pay

## 2020-09-14 DIAGNOSIS — F909 Attention-deficit hyperactivity disorder, unspecified type: Secondary | ICD-10-CM

## 2020-09-14 MED ORDER — PANTOPRAZOLE SODIUM 40 MG PO TBEC: 40.0000 mg | DELAYED_RELEASE_TABLET | Freq: Two times a day (BID) | ORAL | 4 refills | Status: AC

## 2020-09-14 MED ORDER — AMPHETAMINE-DEXTROAMPHETAMINE 20 MG PO TABS: 20.0000 mg | ORAL_TABLET | Freq: Two times a day (BID) | ORAL | 0 refills | Status: DC

## 2020-09-14 NOTE — Telephone Encounter (Signed)
Can you send prescription of refill for adderall to [facility] in [place].  Also need refill of pantoprazole 40 mg x 90 days.    Thanks, Hope you have a great Thanksgiving.   Verania LFD for pantoprazole 12/22/19 #60 with 4 refills LFD for adderall 05/20/20 #60 with no refills NOV none

## 2020-11-11 ENCOUNTER — Encounter: Payer: Self-pay | Admitting: Physician Assistant

## 2020-11-11 DIAGNOSIS — F909 Attention-deficit hyperactivity disorder, unspecified type: Secondary | ICD-10-CM

## 2020-11-12 MED ORDER — AMPHETAMINE-DEXTROAMPHETAMINE 20 MG PO TABS: 20.0000 mg | ORAL_TABLET | Freq: Two times a day (BID) | ORAL | 0 refills | Status: AC

## 2021-09-16 IMAGING — CT CT ABD-PELV W/ CM
2 of 5 series · 15 of 46 positions shown, 17 images · IV contrast (OMNIPAQUE 300)
Comparison: None.

CLINICAL DATA: Loss of weight. History of small-bowel obstruction.
History gallbladder to duodenum fistula. Recent small-bowel
obstruction status post colectomy.

EXAM:
CT ABDOMEN AND PELVIS WITH CONTRAST
TECHNIQUE: Multidetector CT imaging of the abdomen and pelvis was performed
using the standard protocol following bolus administration of
intravenous contrast.
CONTRAST:  100mL OMNIPAQUE IOHEXOL 300 MG/ML  SOLN

[Series 2: abd/pel w · axial · 0.75mm/px · z∈[-510,-116]mm · 12 of 89 slices shown, 14 images]
[im 5/89  soft-tissue]
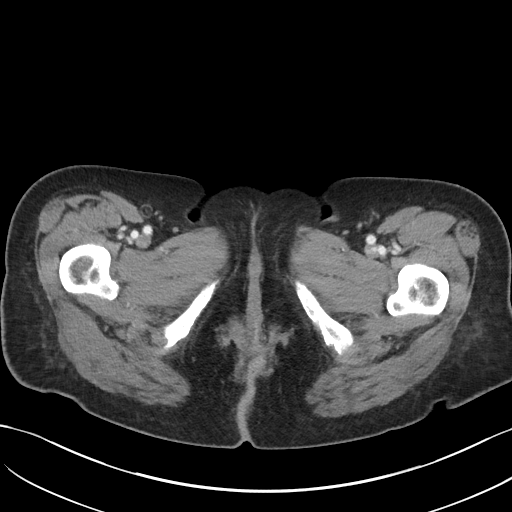
[im 5/89  bone]
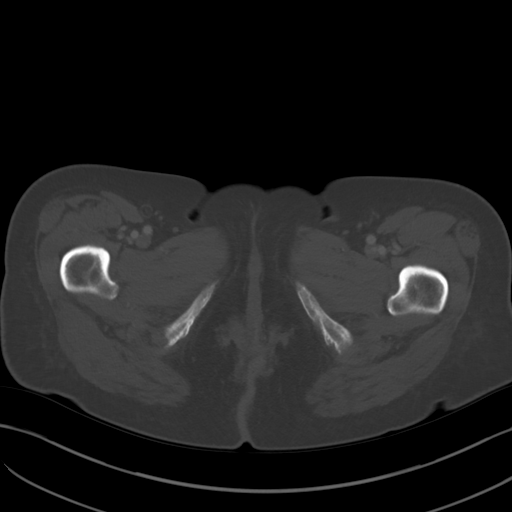
[im 14/89  soft-tissue]
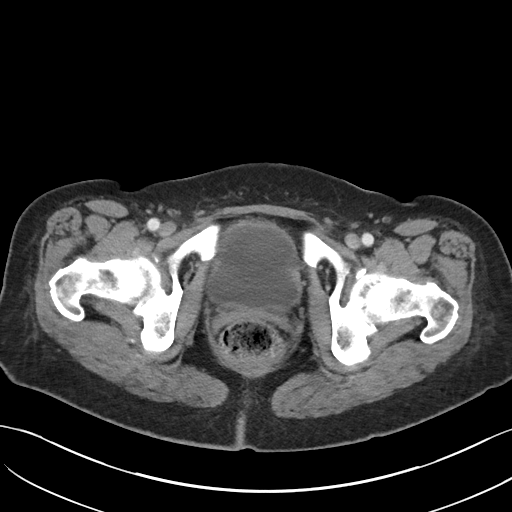
[im 18/89  soft-tissue]
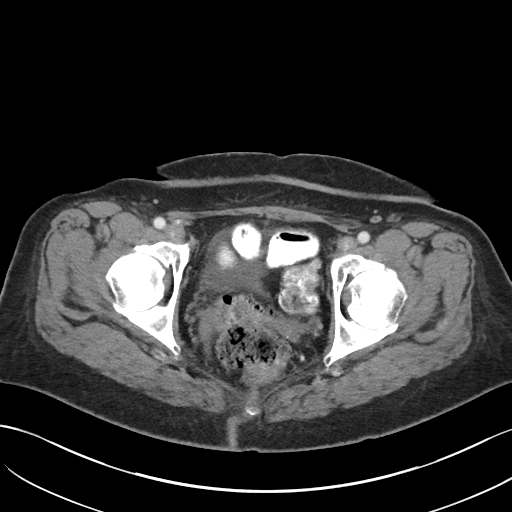
[im 27/89  soft-tissue]
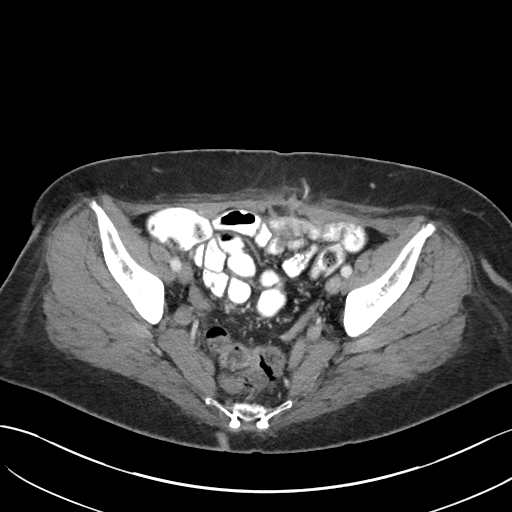
[im 36/89  soft-tissue]
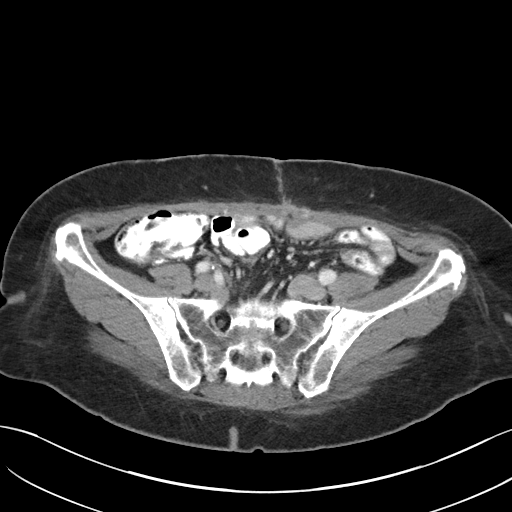
[im 40/89  soft-tissue]
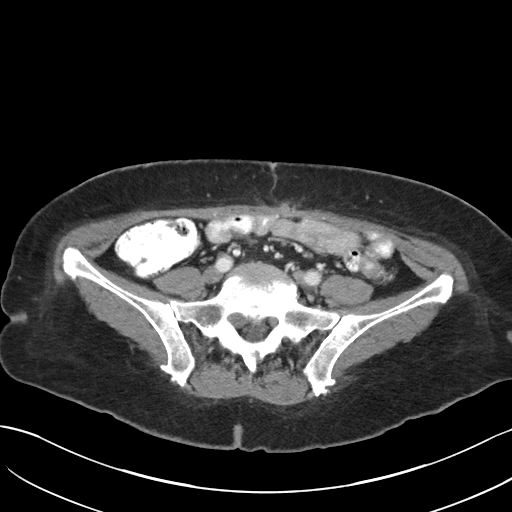
[im 49/89  soft-tissue]
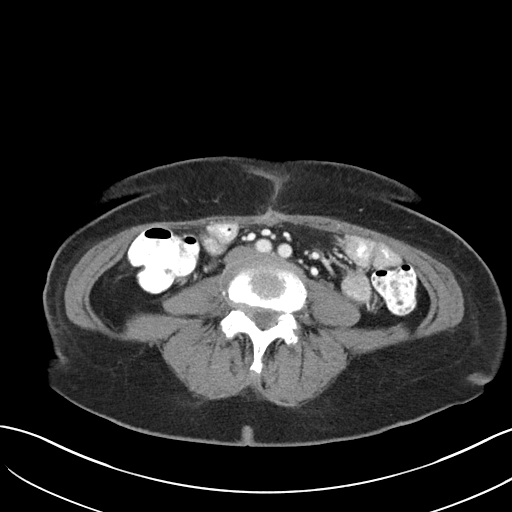
[im 53/89  soft-tissue]
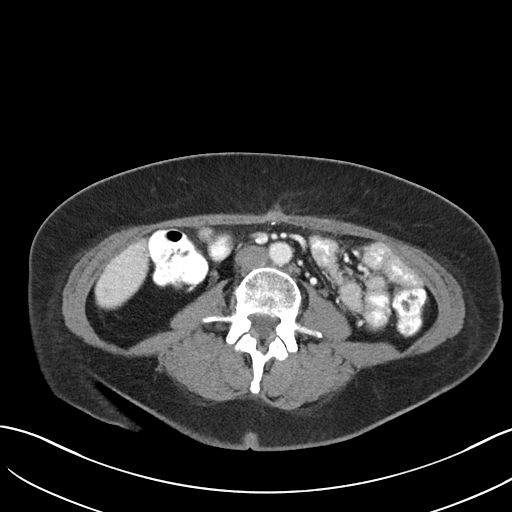
[im 62/89  soft-tissue]
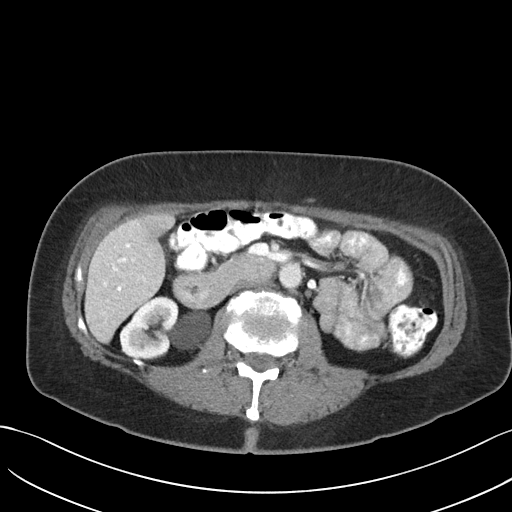
[im 62/89  bone]
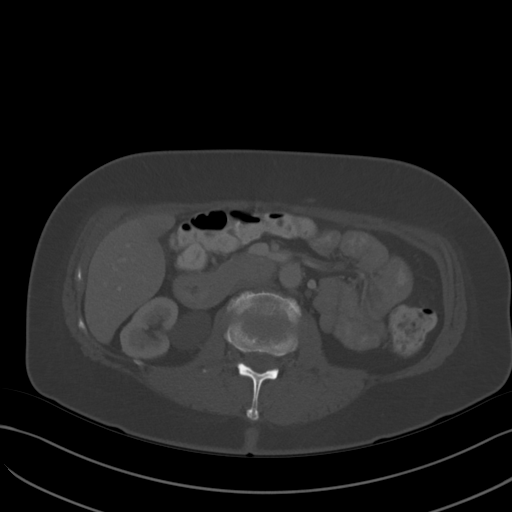
[im 71/89  soft-tissue]
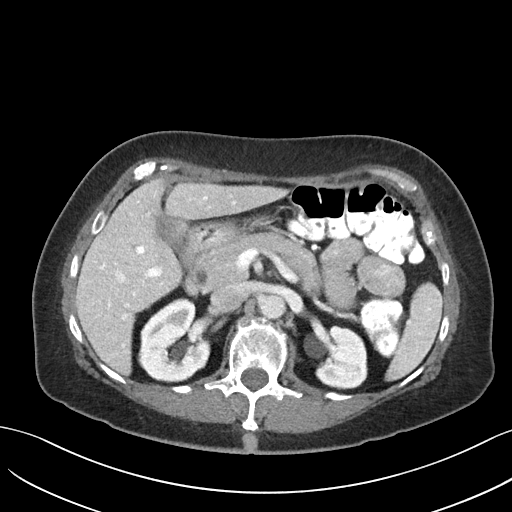
[im 75/89  soft-tissue]
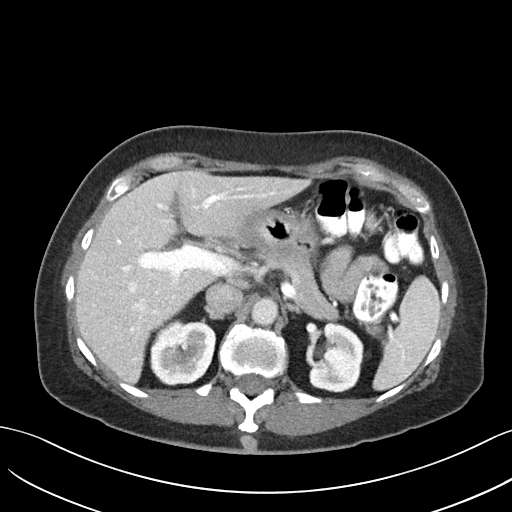
[im 84/89  soft-tissue]
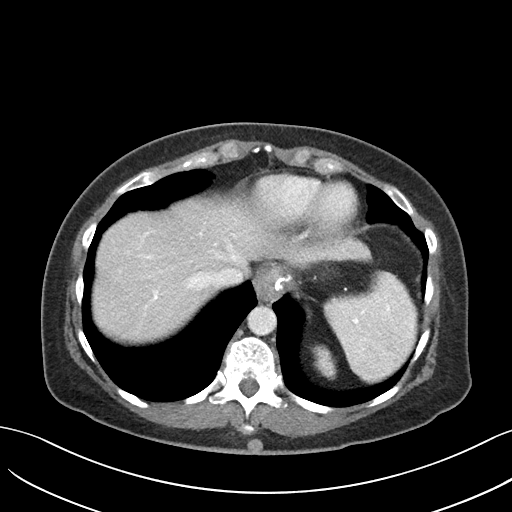

[Series 5: abd/pel w st · coronal · 0.68mm/px · 3 of 71 slices shown]
[im 24/71  soft-tissue]
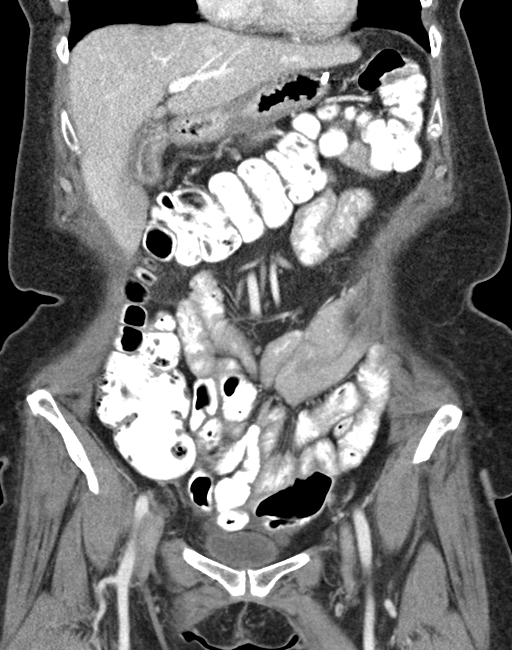
[im 32/71  soft-tissue]
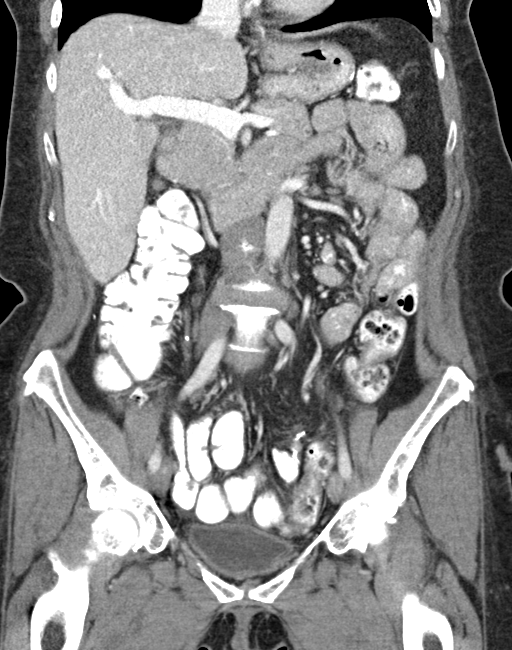
[im 39/71  soft-tissue]
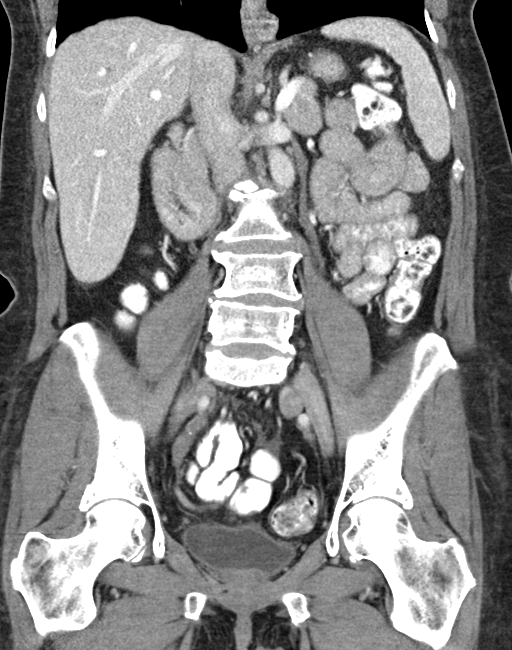

[15 of 46 positions shown; findings below may reference images not displayed]

FINDINGS: Lower chest: No acute abnormality.

Hepatobiliary: No focal liver abnormality is seen. The gallbladder
is partially collapsed. There is diffuse circumferential wall
thickening of the gallbladder. No gallstones identified. Mild
increase caliber of the CBD measures 7 mm. No choledocholithiasis or
mass noted.

Pancreas: Unremarkable. No pancreatic ductal dilatation or
surrounding inflammatory changes.

Spleen: Normal in size without focal abnormality.

Adrenals/Urinary Tract: The adrenal glands appear normal. Bilateral
extrarenal pelvis noted. No kidney stones, mass or hydronephrosis
identified. The urinary bladder appears normal.

Stomach/Bowel: Postop change from previous sleeve gastrectomy.
Postoperative changes involving left lower quadrant small bowel
loops with enteroenteric anastomosis identified. Unremarkable
appearance of the colon. No significant bowel wall thickening,
inflammation or distension identified.

Vascular/Lymphatic: Mild aortic atherosclerosis. No aneurysm. No
abdominopelvic adenopathy identified. No pelvic or inguinal
adenopathy.

Reproductive: Status post hysterectomy. No adnexal masses.

Other: Small volume of free fluid noted within the right posterior
pelvis, image 68/2.

Musculoskeletal: No acute or significant osseous findings.
IMPRESSION: 1. No acute findings identified within the abdomen or pelvis. No
evidence for bowel obstruction.
2. Aortic atherosclerosis.
3. Diffuse circumferential wall thickening of the gallbladder is
identified. No gallstones identified. If there is a clinical concern
for acute cholecystitis consider further evaluation with right upper
quadrant sonogram.
4. Small volume of free fluid noted within the right posterior
pelvis.

Aortic Atherosclerosis ([id number]).

## 2023-09-17 ENCOUNTER — Encounter: Payer: Self-pay | Admitting: Internal Medicine
